# Patient Record
Sex: Female | Born: 1955
Health system: Southern US, Community
[De-identification: ages and names within clinical notes are randomized; demographics above are authoritative.]

## PROBLEM LIST (undated history)

## (undated) DIAGNOSIS — R112 Nausea with vomiting, unspecified: Secondary | ICD-10-CM

## (undated) DIAGNOSIS — M797 Fibromyalgia: Secondary | ICD-10-CM

## (undated) DIAGNOSIS — F32A Depression, unspecified: Secondary | ICD-10-CM

## (undated) DIAGNOSIS — Z9889 Other specified postprocedural states: Secondary | ICD-10-CM

## (undated) DIAGNOSIS — Z460 Encounter for fitting and adjustment of spectacles and contact lenses: Secondary | ICD-10-CM

## (undated) DIAGNOSIS — C439 Malignant melanoma of skin, unspecified: Secondary | ICD-10-CM

## (undated) DIAGNOSIS — C482 Malignant neoplasm of peritoneum, unspecified: Secondary | ICD-10-CM

## (undated) DIAGNOSIS — M1990 Unspecified osteoarthritis, unspecified site: Secondary | ICD-10-CM

## (undated) DIAGNOSIS — G8929 Other chronic pain: Secondary | ICD-10-CM

## (undated) DIAGNOSIS — F329 Major depressive disorder, single episode, unspecified: Secondary | ICD-10-CM

## (undated) HISTORY — PX: CATARACT EXTRACTION W/ INTRAOCULAR LENS  IMPLANT, BILATERAL: SHX1307

## (undated) HISTORY — PX: EPIDURAL BLOCK INJECTION: SHX1516

## (undated) HISTORY — PX: BACK SURGERY: SHX140

## (undated) HISTORY — PX: BREAST BIOPSY: SHX20

## (undated) HISTORY — PX: COLONOSCOPY: SHX174

## (undated) HISTORY — PX: SKIN CANCER EXCISION: SHX779

## (undated) HISTORY — DX: Malignant melanoma of skin, unspecified: C43.9

## (undated) HISTORY — PX: TONSILLECTOMY: SUR1361

---

## 1980-05-30 HISTORY — PX: BREAST ENHANCEMENT SURGERY: SHX7

## 1999-03-08 ENCOUNTER — Ambulatory Visit (HOSPITAL_COMMUNITY): Admission: RE | Admit: 1999-03-08 | Discharge: 1999-03-08 | Payer: Self-pay | Admitting: Obstetrics and Gynecology

## 1999-03-08 ENCOUNTER — Encounter: Payer: Self-pay | Admitting: Obstetrics and Gynecology

## 1999-05-31 HISTORY — PX: ELBOW SURGERY: SHX618

## 1999-08-18 ENCOUNTER — Ambulatory Visit (HOSPITAL_BASED_OUTPATIENT_CLINIC_OR_DEPARTMENT_OTHER): Admission: RE | Admit: 1999-08-18 | Discharge: 1999-08-18 | Payer: Self-pay | Admitting: Orthopedic Surgery

## 2000-05-30 HISTORY — PX: DIAGNOSTIC LAPAROSCOPY: SUR761

## 2000-07-07 ENCOUNTER — Encounter: Payer: Self-pay | Admitting: Emergency Medicine

## 2000-07-07 ENCOUNTER — Emergency Department (HOSPITAL_COMMUNITY): Admission: EM | Admit: 2000-07-07 | Discharge: 2000-07-07 | Payer: Self-pay | Admitting: Emergency Medicine

## 2001-02-21 ENCOUNTER — Emergency Department (HOSPITAL_COMMUNITY): Admission: EM | Admit: 2001-02-21 | Discharge: 2001-02-21 | Payer: Self-pay | Admitting: *Deleted

## 2001-05-30 HISTORY — PX: CARPAL TUNNEL RELEASE: SHX101

## 2001-05-30 HISTORY — PX: DILATION AND CURETTAGE OF UTERUS: SHX78

## 2001-06-08 ENCOUNTER — Ambulatory Visit (HOSPITAL_BASED_OUTPATIENT_CLINIC_OR_DEPARTMENT_OTHER): Admission: RE | Admit: 2001-06-08 | Discharge: 2001-06-08 | Payer: Self-pay | Admitting: Orthopedic Surgery

## 2001-12-03 ENCOUNTER — Encounter: Admission: RE | Admit: 2001-12-03 | Discharge: 2001-12-03 | Payer: Self-pay | Admitting: Orthopedic Surgery

## 2001-12-03 ENCOUNTER — Encounter: Payer: Self-pay | Admitting: Orthopedic Surgery

## 2001-12-20 ENCOUNTER — Encounter: Admission: RE | Admit: 2001-12-20 | Discharge: 2001-12-20 | Payer: Self-pay | Admitting: Orthopedic Surgery

## 2001-12-20 ENCOUNTER — Encounter: Payer: Self-pay | Admitting: Orthopedic Surgery

## 2002-02-04 ENCOUNTER — Other Ambulatory Visit: Admission: RE | Admit: 2002-02-04 | Discharge: 2002-02-04 | Payer: Self-pay | Admitting: Obstetrics and Gynecology

## 2002-03-21 ENCOUNTER — Encounter (INDEPENDENT_AMBULATORY_CARE_PROVIDER_SITE_OTHER): Payer: Self-pay

## 2002-03-21 ENCOUNTER — Ambulatory Visit (HOSPITAL_COMMUNITY): Admission: RE | Admit: 2002-03-21 | Discharge: 2002-03-21 | Payer: Self-pay | Admitting: Obstetrics and Gynecology

## 2003-12-30 ENCOUNTER — Other Ambulatory Visit: Admission: RE | Admit: 2003-12-30 | Discharge: 2003-12-30 | Payer: Self-pay | Admitting: Obstetrics and Gynecology

## 2004-04-20 ENCOUNTER — Ambulatory Visit: Payer: Self-pay | Admitting: Family Medicine

## 2004-07-22 ENCOUNTER — Ambulatory Visit: Payer: Self-pay | Admitting: Family Medicine

## 2004-09-20 ENCOUNTER — Ambulatory Visit: Payer: Self-pay | Admitting: Family Medicine

## 2004-09-23 ENCOUNTER — Ambulatory Visit: Payer: Self-pay | Admitting: Cardiology

## 2004-10-06 ENCOUNTER — Ambulatory Visit: Payer: Self-pay | Admitting: Family Medicine

## 2004-10-07 ENCOUNTER — Emergency Department (HOSPITAL_COMMUNITY): Admission: EM | Admit: 2004-10-07 | Discharge: 2004-10-07 | Payer: Self-pay | Admitting: Emergency Medicine

## 2004-10-07 ENCOUNTER — Ambulatory Visit: Payer: Self-pay | Admitting: Family Medicine

## 2005-02-03 ENCOUNTER — Ambulatory Visit: Payer: Self-pay | Admitting: Family Medicine

## 2005-03-10 ENCOUNTER — Other Ambulatory Visit: Admission: RE | Admit: 2005-03-10 | Discharge: 2005-03-10 | Payer: Self-pay | Admitting: Obstetrics and Gynecology

## 2005-07-20 ENCOUNTER — Ambulatory Visit: Payer: Self-pay | Admitting: Family Medicine

## 2006-03-15 ENCOUNTER — Ambulatory Visit: Payer: Self-pay | Admitting: Gastroenterology

## 2006-04-13 ENCOUNTER — Encounter (INDEPENDENT_AMBULATORY_CARE_PROVIDER_SITE_OTHER): Payer: Self-pay | Admitting: *Deleted

## 2006-04-14 ENCOUNTER — Ambulatory Visit (HOSPITAL_COMMUNITY): Admission: RE | Admit: 2006-04-14 | Discharge: 2006-04-14 | Payer: Self-pay | Admitting: Gastroenterology

## 2006-04-26 ENCOUNTER — Ambulatory Visit: Payer: Self-pay | Admitting: Gastroenterology

## 2007-08-28 ENCOUNTER — Ambulatory Visit: Payer: Self-pay | Admitting: Gastroenterology

## 2007-10-18 ENCOUNTER — Ambulatory Visit: Payer: Self-pay | Admitting: Vascular Surgery

## 2007-11-23 ENCOUNTER — Emergency Department (HOSPITAL_COMMUNITY): Admission: EM | Admit: 2007-11-23 | Discharge: 2007-11-23 | Payer: Self-pay | Admitting: Emergency Medicine

## 2008-06-19 ENCOUNTER — Encounter: Admission: RE | Admit: 2008-06-19 | Discharge: 2008-06-19 | Payer: Self-pay | Admitting: Obstetrics and Gynecology

## 2008-06-26 ENCOUNTER — Encounter: Admission: RE | Admit: 2008-06-26 | Discharge: 2008-06-26 | Payer: Self-pay | Admitting: Obstetrics and Gynecology

## 2009-05-30 HISTORY — PX: BREAST IMPLANT EXCHANGE: SHX6296

## 2010-06-20 ENCOUNTER — Encounter: Payer: Self-pay | Admitting: Obstetrics and Gynecology

## 2010-06-20 ENCOUNTER — Encounter: Payer: Self-pay | Admitting: Orthopedic Surgery

## 2010-08-24 ENCOUNTER — Other Ambulatory Visit: Payer: Self-pay | Admitting: Obstetrics and Gynecology

## 2010-10-12 NOTE — Assessment & Plan Note (Signed)
[facility]                         GASTROENTEROLOGY OFFICE NOTE   NAME:[name], [name] WILLARD                    MRN:          [id number]  DATE:08/28/2007                            DOB:          07/05/1955    This is a return office visit for worsening gas and bloating.  Her  symptoms are exacerbated by meals.  She has the frequent impression that  she is not fully evacuating with bowel movements.  She states she tends  to have a bowel movement every day.  She notes no change in stool  caliber, melena, hematochezia or weight loss.  Prior colonoscopy for  diarrhea, gas and bloating in November of 2007 showed sigmoid colon  diverticulosis.  Random colonic biopsies were negative.   CURRENT MEDICATIONS:  Listed on the chart, updated and reviewed.   MEDICATION ALLERGIES:  None known.   PHYSICAL EXAMINATION:  No acute distress.  Weight 149.2 pounds.  Blood pressure is 116/74, pulse 72 and regular.  HEENT EXAM:  Anicteric sclerae, oropharynx clear.  CHEST:  Clear to auscultation bilaterally.  CARDIAC:  Regular rate and rhythm without murmurs.  ABDOMEN:  Soft, nontender, nondistended, normoactive bowel sounds, no  palpable organomegaly, masses or hernias.   ASSESSMENT/PLAN:  Presumed irritable bowel syndrome with gas, bloating and incomplete  fecal evacuation. Diverticulosis. She is advised to increase her daily  fiber and fluid intake.  Begin MiraLax b.i.d. p.r.n., Colace q. day  p.r.n.  Begin a low-gas diet.  Consider a trial of Xifaxan if the above  measures are not adequate.  Return office visit in 4 to 6 weeks.     Venita Lick. Russella Dar, MD, [facility]  Electronically Signed    MTS/MedQ  DD: 08/30/2007  DT: 08/30/2007  Job #: [id number]   cc:   Dina Rich

## 2010-10-15 NOTE — Op Note (Signed)
[place]. [facility]  Patient:    [name] [name], [name] [name]                     MRN: [id number] Proc. Date: 08/18/99 Adm. Date:  [id number] Attending:  Milly Jakob CC:         Harvie Junior, M.D.                           Operative Report  PREOPERATIVE DIAGNOSES: 1. Lateral epicondylitis. 2. Radial nerve compression documented by electromyography studies, essentially    posterior interosseous nerve compression.  POSTOPERATIVE DIAGNOSES: 1. Lateral epicondylitis. 2. Radial nerve compression documented by electromyography studies, essentially    posterior interosseous nerve compression.  PROCEDURES: 1. Lateral epicondylar release by way of inertial procedure. 2. Decompression of the posterior interosseous nerve.  SURGEON:  Harvie Junior, M.D.  ASSISTANT:  Kerby Less, P.A.  ANESTHESIA:  Axillary block.  BRIEF HISTORY:  She is a 55 year old female with a long history of having had right elbow pain.  She had been treated with injection therapy and this had helped, but there was recurrence of her symptomatology.  She did have an injection in the area of the posterior interosseous nerve and this helped also with her pain, but this recurred.  She did have EMG studies preoperatively, and this was noted to document posterior interosseous nerve compression.  We had a long talk about treatment options at that point but ultimately felt that lateral epicondylar release and radial nerve decompression was appropriate, and she was brought down for these procedures.  DESCRIPTION OF PROCEDURE:  The patient was brought to the operating room and after adequate anesthesia was obtained with an axillary block, the patient was placed on the operating table.  The right arm was then prepped and draped in the usual sterile fashion.  Following this, a linear incision was made about a centimeter  anterior to the lateral epicondyle and going distally about 7 cm,  through the subcutaneous tissue and down to the level of the fascia, and flaps were raised.  The area over the lateral epicondyle was then identified and sharply opened and the periosteum was resected from the bone both proximally and distally in this area. The extensor brevis origin was then identified underneath and the scar was removed out of there.  Fairly dramatic and dense scar tissue was in the area of the brevis. This was resected down to the level of the radiocapitellar joint, which was opened for inspection and noted to have no significant intra-articular abnormalities.  Following a careful debridement in this area, a rongeur was used to freshen the  bone over the lateral epicondyle and the fascia was then closed with an 0 Vicryl interrupted suture.  Following this, attention was turned to the interval between the extensor brevis and the extensor communis.  This was exploited and the supinator muscle was identified below.  The radial nerve was identified in this  area.  It was noted to have fairly dramatic compression by _____ vessels just right over the arcade of Frohse.  The _____ vessels was identified and divided nd cauterized, and then the arcade of Frohse was identified and the fascial band was resected at the origin of the supinator muscle.  The supinator muscle was then divided distally to the distal extent of the supinator muscle, and there was noted to be no more fascial compression of the nerve distally.  A check was made proximally.  There did not appear to be any significant compression on the nerve proximally.  At this point the wound was copiously irrigated and suctioned dry.  The skin was then closed with a combination of 2-0 Vicryl and a running Prolene  suture.  Benzoin and Steri-Strips were applied.  A sterile _____ dressing was then applied with the patient in a 90 degree plaster at the elbow.  She was then taken to the recovery room, where she  was noted to be in satisfactory condition. Estimated blood loss for the procedure was none. DD:  08/18/99 TD:  08/19/99 Job: [id number]

## 2010-10-15 NOTE — Assessment & Plan Note (Signed)
[facility]                           GASTROENTEROLOGY OFFICE NOTE   NAME:[name] [name], [name] [name]                    MRN:          [id number]  DATE:03/15/2006                            DOB:          27-Jul-1955    REASON FOR REFERRAL:  Dyspepsia, gas, bloating and diarrhea.   HISTORY OF PRESENT ILLNESS:  [name] [name] is a 55 year old white female,  who relates a long history of postprandial belching and frequent problems  with postprandial gas, bloating and diarrhea.  She occasionally has had mild  constipation.  She had taken Nexium in the past with some improvement in  symptoms.  She states she had a barium enema about 5 to 7 years ago that was  unremarkable.  She relates no dysphagia, odynophagia, melena, hematochezia  or change in stool caliber.  She has noted about a 20-pound weight gain  since July.  No family members with colon cancer, colon polyps or  inflammatory bowel disease.   PAST MEDICAL HISTORY:  1. Fibromyalgia.  2. Arthritis.  3. Depression.  4. Status post diagnostic laparoscopy in 2002.  5. Status post bilateral breast augmentation.   CURRENT MEDICATIONS:  Listed on the chart, updated and reviewed.   MEDICATION ALLERGIES:  None known.   Social history and review of systems per the handwritten form.   PHYSICAL EXAMINATION:  Well-developed, overweight white female.  Height 5  feet 2 inches.  Blood pressure is 110/70, pulse 72 and regular.  HEENT:  Anicteric sclerae.  Oropharynx is clear.  CHEST:  Clear to auscultation bilaterally.  CARDIAC:  Regular rate and rhythm without murmurs.  ABDOMEN:  Soft, nontender, nondistended.  Normal active bowel sounds, no  palpable organomegaly, masses or hernias.  RECTAL:  Examination deferred to time of colonoscopy.  NEUROLOGIC:  Alert and oriented x3.  Grossly nonfocal.   ASSESSMENT AND PLAN:  I suspect this is irritable bowel syndrome and  gastroesophageal reflux disease.  Trial of  standard antireflux measures and  standard dietary recommendations for intestinal gas and bloating.  Begin  Prevacid 30 mg by mouth every morning.  Need to exclude colorectal  neoplasms, inflammatory bowel disease, ulcer disease, Barrett's esophagus  and erosive esophagitis.  Risks, benefits and alternatives to upper  endoscopy with possible biopsy,  and colonoscopy with possible biopsy and possible polypectomy discussed with  the patient, and she consents to proceed.  Both procedures will be scheduled  electively.     Venita Lick. Russella Dar, MD, [facility]  Electronically Signed    MTS/MedQ  DD: 03/28/2006  DT: 03/29/2006  Job #: [id number]   cc:   Dina Rich

## 2010-10-15 NOTE — Op Note (Signed)
[place]. [facility]  Patient:    [name] [name], [name] [name] Visit Number: [id number] MRN: [id number]          Service Type: DSU Location: [facility] Attending Physician:  Milly Jakob Dictated by:   Harvie Junior, M.D. Proc. Date: 06/08/01 Admit Date:  06/08/2001                             Operative Report  PREOPERATIVE DIAGNOSIS:  Carpal tunnel syndrome, right.  POSTOPERATIVE DIAGNOSIS:  Carpal tunnel syndrome, right.  PROCEDURE:  Right carpal tunnel release.  SURGEON:  Harvie Junior, M.D.  ASSISTANT:  Currie Paris. Thedore Mins.  ANESTHESIA:  Forearm-based IV regional.  BRIEF HISTORY:  This is a 55 year old female with a long history of having significant neck problems as well as bilateral carpal tunnel syndrome.  She ultimately had been treated for her neck problems and carpal tunnel and had continued persistent symptoms.  Because of her persistent symptoms, the patient was ultimately taken to the operating room for carpal tunnel release.  DESCRIPTION OF PROCEDURE:  The patient was brought to the operating room and after adequate anesthesia was obtained with a forearm-based IV regional, the patient was placed supine upon the operating table.  The right arm was prepped and draped in the usual sterile fashion.  Following this, a linear incision was made in the ulnarmost of the patients two midline wrist creases.  The subcutaneous tissues were taken down to the level of the volar carpal ligament, which was sharply divided.  The ligament was then released proximally and distally.  A gloved finger could be placed in the wound in both directions.  The nerve was identified and noted to be without significant evidence of adhesion or abnormality intrinsic.  The motor branch of the median nerve was identified.  At this point the hand was copiously irrigated and suctioned dry.  The skin was then closed with a combination of interrupted and running sutures.  A sterile  and compressive dressing was then applied and the patient was taken to the recovery room, where she was noted to be in satisfactory condition.  Estimated blood loss for the procedure was none. Dictated by:   Harvie Junior, M.D. Attending Physician:  Milly Jakob DD:  06/08/01 TD:  06/08/01 Job: [id number]

## 2010-10-15 NOTE — Op Note (Signed)
   NAME:  SYLVER, VANTASSELL NO.:  [id number]   MEDICAL RECORD NO.:  [id number]                   PATIENT TYPE:  AMB   LOCATION:  SDC                                  FACILITY:  [facility]   PHYSICIAN:  Malva Limes, MD                   DATE OF BIRTH:  1956/01/08   DATE OF PROCEDURE:  03/21/2002  DATE OF DISCHARGE:                                 OPERATIVE REPORT   PREOPERATIVE DIAGNOSES:  Menometrorrhagia.   POSTOPERATIVE DIAGNOSES:  1. Menometrorrhagia.  2. Large endometrial polyp.   PROCEDURE:  1. Dilatation and curettage.  2. Cryo ablation of the endometrium.   SURGEON:  Malva Limes, MD   ANESTHESIA:  MAC with paracervical block.   ESTIMATED BLOOD LOSS:  20 cc.   DRAINS:  None.   COMPLICATIONS:  None.   SPECIMENS:  Endometrial polyp and endometrial curettings sent to pathology.   ANTIBIOTICS:  Ancef 1 g.   PROCEDURE:  The patient was taken to the operating room where she was placed  in a dorsal lithotomy position and MAC anesthesia was administered.  She was  then prepped with Hibiclens and draped in the usual fashion for this  procedure.  A sterile speculum was placed in the vagina.  Single tooth  tenaculum applied to the anterior cervical lip.  20 cc of 1% lidocaine was  used for a paracervical block.  Cervical os was then serially dilated to a  29 [place].  The uterine cavity was sounded to 10 cm.  Sharp curettage was  then advanced into the uterine cavity and sharp curettage was performed.  A  large endometrial polyp was delivered along with copious amount of tissue.  All was sent to pathology.  Following this the cryo ablation machine was set  up.  Warm up was completed without any complications.  The probe was then  placed into the right cornua and the uterine cavity frozen for seven  minutes.  Next, the opposite side was frozen for seven minutes.  The lower  uterine segment was then frozen for five minutes.  The patient tolerated  procedure well.  She was taken to recovery room in stable condition.  She  will be discharged to home.  She will be instructed to follow up in the  office in four weeks.                                               Malva Limes, MD   MA/MEDQ  D:  03/21/2002  T:  03/21/2002  Job:  [id number]

## 2011-02-24 LAB — URINALYSIS, ROUTINE W REFLEX MICROSCOPIC
Bilirubin Urine: NEGATIVE
Glucose, UA: NEGATIVE
Hgb urine dipstick: NEGATIVE
Ketones, ur: NEGATIVE
Protein, ur: NEGATIVE

## 2011-02-24 LAB — DIFFERENTIAL
Basophils Absolute: 0
Eosinophils Relative: 0
Lymphocytes Relative: 7 — ABNORMAL LOW
Lymphs Abs: 0.9
Monocytes Absolute: 0.6

## 2011-02-24 LAB — POCT I-STAT, CHEM 8
BUN: 11
Calcium, Ion: 1.12
Creatinine, Ser: 1.1
TCO2: 25

## 2011-02-24 LAB — CBC
HCT: 40.6
Hemoglobin: 13.9
RDW: 12.9

## 2012-01-27 ENCOUNTER — Other Ambulatory Visit: Payer: Self-pay | Admitting: Obstetrics and Gynecology

## 2012-01-27 DIAGNOSIS — R928 Other abnormal and inconclusive findings on diagnostic imaging of breast: Secondary | ICD-10-CM

## 2012-01-31 ENCOUNTER — Ambulatory Visit: Payer: Self-pay

## 2012-02-06 ENCOUNTER — Ambulatory Visit
Admission: RE | Admit: 2012-02-06 | Discharge: 2012-02-06 | Disposition: A | Discharge status: Home / Self Care | Payer: BC Managed Care – PPO | Source: Ambulatory Visit | Attending: Obstetrics and Gynecology | Admitting: Obstetrics and Gynecology

## 2012-02-06 ENCOUNTER — Other Ambulatory Visit: Payer: Self-pay | Admitting: Diagnostic Radiology

## 2012-02-06 ENCOUNTER — Other Ambulatory Visit: Payer: Self-pay | Admitting: Obstetrics and Gynecology

## 2012-02-06 DIAGNOSIS — R928 Other abnormal and inconclusive findings on diagnostic imaging of breast: Secondary | ICD-10-CM

## 2012-06-19 ENCOUNTER — Encounter (HOSPITAL_BASED_OUTPATIENT_CLINIC_OR_DEPARTMENT_OTHER): Payer: Self-pay | Admitting: *Deleted

## 2012-06-19 ENCOUNTER — Other Ambulatory Visit: Payer: Self-pay | Admitting: Orthopedic Surgery

## 2012-06-19 NOTE — Progress Notes (Signed)
No labs needed Pt does snore and has talked with PCP about study

## 2012-06-22 ENCOUNTER — Encounter (HOSPITAL_BASED_OUTPATIENT_CLINIC_OR_DEPARTMENT_OTHER)
Admission: RE | Disposition: A | Discharge status: Home / Self Care | Payer: Self-pay | Source: Ambulatory Visit | Attending: Orthopedic Surgery

## 2012-06-22 ENCOUNTER — Encounter (HOSPITAL_BASED_OUTPATIENT_CLINIC_OR_DEPARTMENT_OTHER): Payer: Self-pay | Admitting: Anesthesiology

## 2012-06-22 ENCOUNTER — Ambulatory Visit (HOSPITAL_BASED_OUTPATIENT_CLINIC_OR_DEPARTMENT_OTHER): Payer: BC Managed Care – PPO | Admitting: Anesthesiology

## 2012-06-22 ENCOUNTER — Encounter (HOSPITAL_BASED_OUTPATIENT_CLINIC_OR_DEPARTMENT_OTHER): Payer: Self-pay | Admitting: *Deleted

## 2012-06-22 ENCOUNTER — Ambulatory Visit (HOSPITAL_BASED_OUTPATIENT_CLINIC_OR_DEPARTMENT_OTHER)
Admission: RE | Admit: 2012-06-22 | Discharge: 2012-06-22 | Disposition: A | Discharge status: Home / Self Care | Payer: BC Managed Care – PPO | Source: Ambulatory Visit | Attending: Orthopedic Surgery | Admitting: Orthopedic Surgery

## 2012-06-22 DIAGNOSIS — G8929 Other chronic pain: Secondary | ICD-10-CM | POA: Insufficient documentation

## 2012-06-22 DIAGNOSIS — M19049 Primary osteoarthritis, unspecified hand: Secondary | ICD-10-CM | POA: Insufficient documentation

## 2012-06-22 HISTORY — DX: Major depressive disorder, single episode, unspecified: F32.9

## 2012-06-22 HISTORY — DX: Depression, unspecified: F32.A

## 2012-06-22 HISTORY — PX: CARPOMETACARPEL SUSPENSION PLASTY: SHX5005

## 2012-06-22 HISTORY — DX: Unspecified osteoarthritis, unspecified site: M19.90

## 2012-06-22 HISTORY — DX: Nausea with vomiting, unspecified: R11.2

## 2012-06-22 HISTORY — DX: Encounter for fitting and adjustment of spectacles and contact lenses: Z46.0

## 2012-06-22 HISTORY — DX: Fibromyalgia: M79.7

## 2012-06-22 HISTORY — DX: Other specified postprocedural states: Z98.890

## 2012-06-22 HISTORY — DX: Other chronic pain: G89.29

## 2012-06-22 LAB — POCT HEMOGLOBIN-HEMACUE: Hemoglobin: 11.6 g/dL — ABNORMAL LOW (ref 12.0–15.0)

## 2012-06-22 SURGERY — CARPOMETACARPEL (CMC) SUSPENSION PLASTY
Anesthesia: Regional | Site: Thumb | Laterality: Left | Wound class: Clean

## 2012-06-22 MED ORDER — LACTATED RINGERS IV SOLN
INTRAVENOUS | Status: DC
  Administered 2012-06-22 (×2): via INTRAVENOUS

## 2012-06-22 MED ORDER — FENTANYL CITRATE 0.05 MG/ML IJ SOLN: 25.0000 ug | INTRAMUSCULAR | Status: DC | PRN

## 2012-06-22 MED ORDER — OXYCODONE-ACETAMINOPHEN 5-325 MG PO TABS: 1.0000 | ORAL_TABLET | ORAL | Status: DC | PRN

## 2012-06-22 MED ORDER — CEFAZOLIN SODIUM-DEXTROSE 2-3 GM-% IV SOLR
2.0000 g | INTRAVENOUS | Status: AC
Start: 2012-06-23 — End: 2012-06-22
  Administered 2012-06-22: 2 g via INTRAVENOUS

## 2012-06-22 MED ORDER — POVIDONE-IODINE 7.5 % EX SOLN: Freq: Once | CUTANEOUS | Status: DC

## 2012-06-22 MED ORDER — BUPIVACAINE-EPINEPHRINE PF 0.5-1:200000 % IJ SOLN
INTRAMUSCULAR | Status: DC | PRN
  Administered 2012-06-22: 25 mL

## 2012-06-22 MED ORDER — OXYCODONE HCL 5 MG/5ML PO SOLN: 5.0000 mg | Freq: Once | ORAL | Status: DC | PRN

## 2012-06-22 MED ORDER — DEXAMETHASONE SODIUM PHOSPHATE 4 MG/ML IJ SOLN
INTRAMUSCULAR | Status: DC | PRN
  Administered 2012-06-22: 10 mg via INTRAVENOUS

## 2012-06-22 MED ORDER — DEXAMETHASONE SODIUM PHOSPHATE 10 MG/ML IJ SOLN
INTRAMUSCULAR | Status: DC | PRN
  Administered 2012-06-22: 7 mg

## 2012-06-22 MED ORDER — MIDAZOLAM HCL 2 MG/2ML IJ SOLN
1.0000 mg | INTRAMUSCULAR | Status: DC | PRN
  Administered 2012-06-22: 2 mg via INTRAVENOUS

## 2012-06-22 MED ORDER — PROPOFOL 10 MG/ML IV BOLUS
INTRAVENOUS | Status: DC | PRN
  Administered 2012-06-22: 250 mg via INTRAVENOUS

## 2012-06-22 MED ORDER — FENTANYL CITRATE 0.05 MG/ML IJ SOLN
50.0000 ug | INTRAMUSCULAR | Status: DC | PRN
  Administered 2012-06-22 (×2): 100 ug via INTRAVENOUS

## 2012-06-22 MED ORDER — LIDOCAINE HCL (CARDIAC) 20 MG/ML IV SOLN
INTRAVENOUS | Status: DC | PRN
  Administered 2012-06-22: 80 mg via INTRAVENOUS

## 2012-06-22 MED ORDER — ONDANSETRON HCL 4 MG/2ML IJ SOLN
INTRAMUSCULAR | Status: DC | PRN
  Administered 2012-06-22: 4 mg via INTRAVENOUS

## 2012-06-22 MED ORDER — OXYCODONE HCL 5 MG PO TABS: 5.0000 mg | ORAL_TABLET | Freq: Once | ORAL | Status: DC | PRN

## 2012-06-22 MED ORDER — PROMETHAZINE HCL 25 MG PO TABS: 25.0000 mg | ORAL_TABLET | Freq: Four times a day (QID) | ORAL | Status: DC | PRN

## 2012-06-22 MED ORDER — ONDANSETRON HCL 4 MG/2ML IJ SOLN: 4.0000 mg | Freq: Four times a day (QID) | INTRAMUSCULAR | Status: DC | PRN

## 2012-06-22 SURGICAL SUPPLY — 81 items
APL SKNCLS STERI-STRIP NONHPOA (GAUZE/BANDAGES/DRESSINGS)
BANDAGE ELASTIC 3 VELCRO ST LF (GAUZE/BANDAGES/DRESSINGS) ×2 IMPLANT
BENZOIN TINCTURE PRP APPL 2/3 (GAUZE/BANDAGES/DRESSINGS) IMPLANT
BIT DRILL 11/64XX180123XX4 (BIT) ×1
BIT DRILL 11/64XX180123XX4.3 (BIT) ×1 IMPLANT
BLADE MINI RND TIP GREEN BEAV (BLADE) ×2 IMPLANT
BLADE SURG 15 STRL LF DISP TIS (BLADE) ×1 IMPLANT
BLADE SURG 15 STRL SS (BLADE) ×2
BNDG CMPR 9X4 STRL LF SNTH (GAUZE/BANDAGES/DRESSINGS) ×1
BNDG ESMARK 4X9 LF (GAUZE/BANDAGES/DRESSINGS) ×2 IMPLANT
CANISTER SUCTION 1200CC (MISCELLANEOUS) IMPLANT
CLOTH BEACON ORANGE TIMEOUT ST (SAFETY) ×2 IMPLANT
COVER MAYO STAND STRL (DRAPES) ×2 IMPLANT
COVER TABLE BACK 60X90 (DRAPES) ×2 IMPLANT
CUFF TOURNIQUET SINGLE 18IN (TOURNIQUET CUFF) IMPLANT
DECANTER SPIKE VIAL GLASS SM (MISCELLANEOUS) IMPLANT
DRAIN PENROSE 1/4X12 LTX STRL (WOUND CARE) IMPLANT
DRAPE EXTREMITY T 121X128X90 (DRAPE) ×2 IMPLANT
DRAPE OEC MINIVIEW 54X84 (DRAPES) ×2 IMPLANT
DRAPE SURG 17X23 STRL (DRAPES) ×2 IMPLANT
DRAPE U-SHAPE 47X51 STRL (DRAPES) IMPLANT
DRILL BIT 1/8DIAX5INL DISPOSE (BIT) ×2 IMPLANT
DRILL BIT 11/64XX180123XX4.3 (BIT) ×2
DURAPREP 26ML APPLICATOR (WOUND CARE) ×2 IMPLANT
ELECT NEEDLE TIP 2.8 STRL (NEEDLE) IMPLANT
ELECT REM PT RETURN 9FT ADLT (ELECTROSURGICAL) ×2
ELECTRODE REM PT RTRN 9FT ADLT (ELECTROSURGICAL) ×1 IMPLANT
GAUZE XEROFORM 1X8 LF (GAUZE/BANDAGES/DRESSINGS) ×2 IMPLANT
GLOVE BIOGEL M STRL SZ7.5 (GLOVE) ×2 IMPLANT
GLOVE BIOGEL PI IND STRL 7.0 (GLOVE) ×1 IMPLANT
GLOVE BIOGEL PI IND STRL 8 (GLOVE) ×3 IMPLANT
GLOVE BIOGEL PI INDICATOR 7.0 (GLOVE) ×1
GLOVE BIOGEL PI INDICATOR 8 (GLOVE) ×3
GLOVE ECLIPSE 7.5 STRL STRAW (GLOVE) ×4 IMPLANT
GLOVE EXAM NITRILE EXT CUFF MD (GLOVE) ×2 IMPLANT
GLOVE SKINSENSE NS SZ6.5 (GLOVE) ×1
GLOVE SKINSENSE NS SZ7.5 (GLOVE) ×3
GLOVE SKINSENSE STRL SZ6.5 (GLOVE) ×1 IMPLANT
GLOVE SKINSENSE STRL SZ7.5 (GLOVE) ×3 IMPLANT
GOWN BRE IMP PREV XXLGXLNG (GOWN DISPOSABLE) ×2 IMPLANT
GOWN PREVENTION PLUS XLARGE (GOWN DISPOSABLE) ×2 IMPLANT
GOWN PREVENTION PLUS XXLARGE (GOWN DISPOSABLE) ×4 IMPLANT
KIT MINI BIO ANCHOR DRILL (KITS) IMPLANT
NEEDLE FISTULA 1/2 CIRCLE (NEEDLE) IMPLANT
NEEDLE HYPO 25X1 1.5 SAFETY (NEEDLE) IMPLANT
NEEDLE KEITH (NEEDLE) ×2 IMPLANT
NS IRRIG 1000ML POUR BTL (IV SOLUTION) IMPLANT
PACK BASIN DAY SURGERY FS (CUSTOM PROCEDURE TRAY) ×2 IMPLANT
PAD CAST 3X4 CTTN HI CHSV (CAST SUPPLIES) ×2 IMPLANT
PADDING CAST ABS 4INX4YD NS (CAST SUPPLIES) ×1
PADDING CAST ABS COTTON 4X4 ST (CAST SUPPLIES) ×1 IMPLANT
PADDING CAST COTTON 3X4 STRL (CAST SUPPLIES) ×4
PASSER SUT SWANSON 36MM LOOP (INSTRUMENTS) IMPLANT
PENCIL BUTTON HOLSTER BLD 10FT (ELECTRODE) ×2 IMPLANT
SHEET MEDIUM DRAPE 40X70 STRL (DRAPES) IMPLANT
SPLINT PLASTER CAST XFAST 3X15 (CAST SUPPLIES) ×10 IMPLANT
SPLINT PLASTER XTRA FASTSET 3X (CAST SUPPLIES) ×10
SPONGE GAUZE 4X4 12PLY (GAUZE/BANDAGES/DRESSINGS) ×2 IMPLANT
STOCKINETTE 4X48 STRL (DRAPES) IMPLANT
STRIP CLOSURE SKIN 1/2X4 (GAUZE/BANDAGES/DRESSINGS) IMPLANT
SUCTION FRAZIER TIP 10 FR DISP (SUCTIONS) IMPLANT
SUT ETHIBOND 3-0 V-5 (SUTURE) IMPLANT
SUT ETHILON 4 0 PS 2 18 (SUTURE) IMPLANT
SUT FIBERWIRE 4-0 18 DIAM BLUE (SUTURE)
SUT FIBERWIRE 4-0 18 TAPR NDL (SUTURE) ×6
SUT POLY BUTTON 15MM (SUTURE) IMPLANT
SUT PROLENE 3 0 PS 2 (SUTURE) IMPLANT
SUT VIC AB 2-0 SH 27 (SUTURE)
SUT VIC AB 2-0 SH 27XBRD (SUTURE) IMPLANT
SUT VIC AB 4-0 P-3 18XBRD (SUTURE) ×1 IMPLANT
SUT VIC AB 4-0 P3 18 (SUTURE) ×2
SUT VICRYL 4-0 PS2 18IN ABS (SUTURE) IMPLANT
SUTURE FIBERWR 4-0 18 DIA BLUE (SUTURE) IMPLANT
SUTURE FIBERWR 4-0 18 TAPR NDL (SUTURE) ×3 IMPLANT
SYR BULB 3OZ (MISCELLANEOUS) ×2 IMPLANT
SYR CONTROL 10ML LL (SYRINGE) IMPLANT
TOWEL OR 17X24 6PK STRL BLUE (TOWEL DISPOSABLE) ×2 IMPLANT
TOWEL OR NON WOVEN STRL DISP B (DISPOSABLE) ×2 IMPLANT
TUBE CONNECTING 20X1/4 (TUBING) IMPLANT
UNDERPAD 30X30 INCONTINENT (UNDERPADS AND DIAPERS) ×2 IMPLANT
WATER STERILE IRR 1000ML POUR (IV SOLUTION) ×2 IMPLANT

## 2012-06-22 NOTE — Brief Op Note (Signed)
06/22/2012  4:01 PM  PATIENT:  Lamar Benes  57 y.o. female  PRE-OPERATIVE DIAGNOSIS:  cmc joint arthritis left thumb  POST-OPERATIVE DIAGNOSIS:  Carpometacarpal joint arthritis Left Thumb  PROCEDURE:  Procedure(s) (LRB) with comments: CARPOMETACARPEL (CMC) SUSPENSION PLASTY (Left) - burton's interposition arthroplasty left thumb   SURGEON:  Surgeon(s) and Role:    * Harvie Junior, MD - Primary  PHYSICIAN ASSISTANT:   ASSISTANTS: bethune   ANESTHESIA:   general  EBL:  Total I/O In: 1400 [I.V.:1400] Out: -   BLOOD ADMINISTERED:none  DRAINS: none   LOCAL MEDICATIONS USED:  MARCAINE     SPECIMEN:  No Specimen  DISPOSITION OF SPECIMEN:  N/A  COUNTS:  YES  TOURNIQUET:   Total Tourniquet Time Documented: Upper Arm (Left) - 84 minutes  DICTATION: .Other Dictation: Dictation Number [id number]  PLAN OF CARE: Discharge to home after PACU  PATIENT DISPOSITION:  PACU - hemodynamically stable.   Delay start of Pharmacological VTE agent (>24hrs) due to surgical blood loss or risk of bleeding: no

## 2012-06-22 NOTE — Progress Notes (Signed)
Assisted Dr. Crews with left, ultrasound guided, infraclavicular block. Side rails up, monitors on throughout procedure. See vital signs in flow sheet. Tolerated Procedure well. 

## 2012-06-22 NOTE — Anesthesia Postprocedure Evaluation (Signed)
  Anesthesia Post-op Note  Patient: [name] [name]  Procedure(s) Performed: Procedure(s) (LRB) with comments: CARPOMETACARPEL (CMC) SUSPENSION PLASTY (Left) - burton's interposition arthroplasty left thumb   Patient Location: PACU  Anesthesia Type:GA combined with regional for post-op pain  Level of Consciousness: awake and alert   Airway and Oxygen Therapy: Patient Spontanous Breathing and Patient connected to face mask oxygen  Post-op Pain: mild  Post-op Assessment: Post-op Vital signs reviewed  Post-op Vital Signs: Reviewed  Complications: No apparent anesthesia complications

## 2012-06-22 NOTE — Anesthesia Procedure Notes (Addendum)
Procedure Name: LMA Insertion Date/Time: 06/22/2012 2:12 PM Performed by: Gar Gibbon Pre-anesthesia Checklist: Patient identified, Emergency Drugs available, Suction available and Patient being monitored Patient Re-evaluated:Patient Re-evaluated prior to inductionOxygen Delivery Method: Circle System Utilized Preoxygenation: Pre-oxygenation with 100% oxygen Intubation Type: IV induction Ventilation: Mask ventilation without difficulty LMA: LMA inserted LMA Size: 4.0 Number of attempts: 1 Airway Equipment and Method: bite block Placement Confirmation: positive ETCO2 Tube secured with: Tape Dental Injury: Teeth and Oropharynx as per pre-operative assessment     Anesthesia Regional Block:  Supraclavicular block  Pre-Anesthetic Checklist: ,, timeout performed, Correct Patient, Correct Site, Correct Laterality, Correct Procedure, Correct Position, site marked, Risks and benefits discussed,  Surgical consent,  Pre-op evaluation,  At surgeon's request and post-op pain management  Laterality: Left and Upper  Prep: chloraprep       Needles:  Injection technique: Single-shot  Needle Type: Echogenic Needle     Needle Length: 5cm 5 cm Needle Gauge: 21 and 21 G    Additional Needles:  Procedures: ultrasound guided (picture in chart) Supraclavicular block Narrative:  Start time: 06/22/2012 1:40 PM End time: 06/22/2012 1:48 PM Injection made incrementally with aspirations every 5 mL.  Performed by: Personally  Anesthesiologist: Sheldon Silvan  Supraclavicular block

## 2012-06-22 NOTE — Transfer of Care (Signed)
Immediate Anesthesia Transfer of Care Note  Patient: [name]  Procedure(s) Performed: Procedure(s) (LRB) with comments: CARPOMETACARPEL (CMC) SUSPENSION PLASTY (Left) - burton's interposition arthroplasty left thumb   Patient Location: PACU  Anesthesia Type:GA combined with regional for post-op pain  Level of Consciousness: sedated  Airway & Oxygen Therapy: Patient Spontanous Breathing and Patient connected to face mask oxygen  Post-op Assessment: Report given to PACU RN and Post -op Vital signs reviewed and stable  Post vital signs: Reviewed and stable  Complications: No apparent anesthesia complications

## 2012-06-22 NOTE — H&P (Signed)
  PREOPERATIVE H&P  Chief Complaint: l thumb base pain  HPI: [name] [name] is a 57 y.o. female who presents for evaluation of l thumb base pain. It has been present for greater than 1 year and has been worsening. She has failed conservative measures. Pain is rated as severe.  Past Medical History  Diagnosis Date  . Fibromyalgia   . Arthritis   . Depression   . Chronic pain   . Contact lens/glasses fitting     wears contacts or glasses  . PONV (postoperative nausea and vomiting)    Past Surgical History  Procedure Date  . Tonsillectomy   . Diagnostic laparoscopy 2002    expl lap  . Dilation and curettage of uterus 2003    ablation  . Breast enhancement surgery 1982    implants   . Breast implant exchange 2011  . Carpal tunnel release 2003    rt and lt  . Elbow surgery 2001    decompression rt  . Back surgery     lumb lam  . Epidural block injection     multiple   History   Social History  . Marital Status: Married    Spouse Name: N/A    Number of Children: N/A  . Years of Education: N/A   Social History Main Topics  . Smoking status: Never Smoker   . Smokeless tobacco: None  . Alcohol Use: Yes     Comment: rare  . Drug Use: No  . Sexually Active:    Other Topics Concern  . None   Social History Narrative  . None   History reviewed. No pertinent family history. Allergies  Allergen Reactions  . Codeine Nausea And Vomiting  . Latex Rash   Prior to Admission medications   Medication Sig Start Date End Date Taking? Authorizing Provider  lisdexamfetamine (VYVANSE) 30 MG capsule Take 30 mg by mouth every morning.   Yes Historical Provider, MD  PARoxetine (PAXIL) 40 MG tablet Take 40 mg by mouth at bedtime.   Yes Historical Provider, MD  traZODone (DESYREL) 150 MG tablet Take 150 mg by mouth at bedtime.   Yes Historical Provider, MD     Positive ROS: none  All other systems have been reviewed and were otherwise negative with the exception of those  mentioned in the HPI and as above.  Physical Exam: There were no vitals filed for this visit.  General: Alert, no acute distress Cardiovascular: No pedal edema Respiratory: No cyanosis, no use of accessory musculature GI: No organomegaly, abdomen is soft and non-tender Skin: No lesions in the area of chief complaint Neurologic: Sensation intact distally Psychiatric: Patient is competent for consent with normal mood and affect Lymphatic: No axillary or cervical lymphadenopathy  MUSCULOSKELETAL: l thumb +ttp over cmc joint  XRAY: severe jt space loss with mild subluxation base of l thumb  Assessment/Plan: cmc joint arthritis left thumb Plan for Procedure(s): CARPOMETACARPEL (CMC) SUSPENSION PLASTY  The risks benefits and alternatives were discussed with the patient including but not limited to the risks of nonoperative treatment, versus surgical intervention including infection, bleeding, nerve injury, malunion, nonunion, hardware prominence, hardware failure, need for hardware removal, blood clots, cardiopulmonary complications, morbidity, mortality, among others, and they were willing to proceed.  Predicted outcome is good, although there will be at least a six to nine month expected recovery.  Kenzey Birkland L, MD 06/22/2012 1:12 PM

## 2012-06-22 NOTE — Anesthesia Preprocedure Evaluation (Addendum)
Anesthesia Evaluation  Patient identified by MRN, date of birth, ID band Patient awake    Reviewed: Allergy & Precautions, H&P , NPO status , Patient's Chart, lab work & pertinent test results  History of Anesthesia Complications (+) PONV  Airway Mallampati: II  Neck ROM: full    Dental   Pulmonary          Cardiovascular     Neuro/Psych Depression  Neuromuscular disease    GI/Hepatic   Endo/Other    Renal/GU      Musculoskeletal  (+) Arthritis -, Fibromyalgia -  Abdominal   Peds  Hematology   Anesthesia Other Findings   Reproductive/Obstetrics                           Anesthesia Physical Anesthesia Plan  ASA: II  Anesthesia Plan: General   Post-op Pain Management:    Induction: Intravenous  Airway Management Planned: LMA  Additional Equipment:   Intra-op Plan:   Post-operative Plan:   Informed Consent: I have reviewed the patients History and Physical, chart, labs and discussed the procedure including the risks, benefits and alternatives for the proposed anesthesia with the patient or authorized representative who has indicated his/her understanding and acceptance.     Plan Discussed with: CRNA, Surgeon and Anesthesiologist  Anesthesia Plan Comments:        Anesthesia Quick Evaluation

## 2012-06-25 ENCOUNTER — Encounter (HOSPITAL_BASED_OUTPATIENT_CLINIC_OR_DEPARTMENT_OTHER): Payer: Self-pay | Admitting: Orthopedic Surgery

## 2012-06-25 NOTE — Op Note (Signed)
NAME:  FAYLINN, SCHWENN             ACCOUNT NO.:  [id number]  MEDICAL RECORD NO.:  [id number]  LOCATION:                                 FACILITY:  PHYSICIAN:  Harvie Junior, M.D.   DATE OF BIRTH:  1955/06/27  DATE OF PROCEDURE:  06/22/2012 DATE OF DISCHARGE:                              OPERATIVE REPORT   PREOPERATIVE DIAGNOSIS:  Severe CMC joint arthritis, left.  POSTOPERATIVE DIAGNOSIS:  Severe CMC joint arthritis, left.  PROCEDURE: 1. Left CMC arthroplasty with flexor carpi radialis, harvesting with anchovy interposition. 2. Flexor tendon transfer of fcr tendon to base of 1st metacarpal SURGEON:  Harvie Junior, M.D.  ASSISTANT:  Marshia Ly, P.A.  ANESTHESIA:  General.  BRIEF HISTORY:  Mrs. Peace is a 57 year old female with history of having had a severe CMC arthroplasty.  We followed her for years with complaints of pain in this area.  X-ray showed severe bone-on-bone changes and after failure of all conservative care, she was allowed to taken to the operating room for left [facility] arthroplasty.  DESCRIPTION OF PROCEDURE:  The patient was taken to the operating room. After adequate anesthesia has been obtained with general anesthesia, the patient was placed supine on operating table.  The left arm was then prepped and draped in the usual sterile fashion.  Once this was completed, attention was turned towards the left arm where a small incision was made curvilinear along the base of the thumb down to the FCR tendon and this was exploited down to the area of the extensor tendon of the thumb and the capsule over the base of the thumb and the trapezium was opened and this was tacked.  Once this was done, the trapezium was excised in total and then attention was turned towards the drilling a hole through the dorsal aspect of the thumb perpendicular to the long axis of the thumb to the thumbnail and a drill hole was placed into the dorsal come down to the articular  surface.  This was expanded with a larger drill and then attention was turned proximally towards the FCR tendon.  The FCR was identified in the mid forearm and released proximally and distally.  The fascia and then a pigtail tendon stripper was used, pushed up to the elbow and this brought a large portion of the FCR tendon and the place were harvested all the muscle off with an osteotome.  Then, we pulled it distally and the wound carefully dissected the FCR tendon all the way to the base of the 2nd metacarpal. Once this was done, the tendon was passed through the base of the 1st metacarpal out the back and then sewn back on itself creating the arthroplasty.  The remaining portions of the of the tendon were then placed as an anchovy down into the base of the area of the [facility] and this was done with 4-0 FiberWire which had been placed, 2 of them in the base of the of the trapezial fossa.  Once this was passed over the [place] needles, this was tied down to the base of the wound, and then the second suture which had been placed in the opposite direction was then tied  over top of the anchovy.  At this point, care was taken to pull the thumb into extension and abduction prior to performing the suspension plasty and it was held in that position during the tying of all sutures. The periosteum was used to anchor the FCR tendon at the base of the thumb as well and then the capsule was reapproximated over the FCR tendon and the extensor tendon was reapproximated down off to the capsule.  Once this was completed, the wound was copiously and thoroughly irrigated, suctioned dry, and closed with a combination of interrupted and running sutures, subcuticular, benzoin and Steri-Strips were applied.  The proximal wound was closed as well and a short thumb spica splint with the thumb in abduction was placed and the patient was taken to recovery and was noted to be in satisfactory condition. Estimated blood  loss for procedure was none.     Harvie Junior, M.D.     Ranae Plumber  D:  06/22/2012  T:  06/23/2012  Job:  [id number]

## 2012-06-26 DIAGNOSIS — G894 Chronic pain syndrome: Secondary | ICD-10-CM | POA: Insufficient documentation

## 2014-03-21 ENCOUNTER — Encounter: Payer: Self-pay | Admitting: Gastroenterology

## 2014-04-14 ENCOUNTER — Telehealth: Payer: Self-pay | Admitting: Pulmonary Disease

## 2014-04-14 NOTE — Telephone Encounter (Signed)
error 

## 2015-03-16 ENCOUNTER — Other Ambulatory Visit: Payer: Self-pay | Admitting: Obstetrics and Gynecology

## 2015-03-16 DIAGNOSIS — Z13820 Encounter for screening for osteoporosis: Secondary | ICD-10-CM

## 2015-03-26 ENCOUNTER — Ambulatory Visit: Payer: Self-pay | Attending: Obstetrics and Gynecology

## 2015-04-08 ENCOUNTER — Ambulatory Visit
Admission: RE | Admit: 2015-04-08 | Discharge: 2015-04-08 | Disposition: A | Discharge status: Home / Self Care | Payer: 59 | Source: Ambulatory Visit | Attending: Obstetrics and Gynecology | Admitting: Obstetrics and Gynecology

## 2015-04-08 DIAGNOSIS — Z780 Asymptomatic menopausal state: Secondary | ICD-10-CM | POA: Insufficient documentation

## 2015-04-08 DIAGNOSIS — Z13820 Encounter for screening for osteoporosis: Secondary | ICD-10-CM | POA: Diagnosis not present

## 2015-07-09 ENCOUNTER — Other Ambulatory Visit: Payer: Self-pay | Admitting: Neurosurgery

## 2015-07-09 DIAGNOSIS — M4802 Spinal stenosis, cervical region: Secondary | ICD-10-CM

## 2015-07-18 ENCOUNTER — Ambulatory Visit
Admission: RE | Admit: 2015-07-18 | Discharge: 2015-07-18 | Disposition: A | Discharge status: Home / Self Care | Payer: Self-pay | Source: Ambulatory Visit | Attending: Neurosurgery | Admitting: Neurosurgery

## 2015-07-18 DIAGNOSIS — M4802 Spinal stenosis, cervical region: Secondary | ICD-10-CM

## 2015-11-03 DIAGNOSIS — G8929 Other chronic pain: Secondary | ICD-10-CM | POA: Insufficient documentation

## 2015-11-03 DIAGNOSIS — M797 Fibromyalgia: Secondary | ICD-10-CM | POA: Insufficient documentation

## 2015-11-03 DIAGNOSIS — K589 Irritable bowel syndrome without diarrhea: Secondary | ICD-10-CM | POA: Insufficient documentation

## 2015-11-03 DIAGNOSIS — F3340 Major depressive disorder, recurrent, in remission, unspecified: Secondary | ICD-10-CM | POA: Insufficient documentation

## 2016-01-26 DIAGNOSIS — R030 Elevated blood-pressure reading, without diagnosis of hypertension: Secondary | ICD-10-CM | POA: Insufficient documentation

## 2016-01-26 DIAGNOSIS — I8390 Asymptomatic varicose veins of unspecified lower extremity: Secondary | ICD-10-CM | POA: Insufficient documentation

## 2016-02-15 ENCOUNTER — Other Ambulatory Visit: Payer: Self-pay | Admitting: Obstetrics and Gynecology

## 2016-02-15 DIAGNOSIS — Z1231 Encounter for screening mammogram for malignant neoplasm of breast: Secondary | ICD-10-CM

## 2016-03-08 ENCOUNTER — Encounter: Payer: Self-pay | Admitting: Gastroenterology

## 2016-03-15 ENCOUNTER — Ambulatory Visit: Payer: Self-pay

## 2016-05-02 ENCOUNTER — Encounter: Payer: Self-pay | Admitting: Vascular Surgery

## 2016-08-16 ENCOUNTER — Encounter: Payer: Self-pay | Admitting: Obstetrics and Gynecology

## 2016-08-16 ENCOUNTER — Ambulatory Visit (INDEPENDENT_AMBULATORY_CARE_PROVIDER_SITE_OTHER): Payer: BLUE CROSS/BLUE SHIELD | Admitting: Obstetrics and Gynecology

## 2016-08-16 DIAGNOSIS — Z124 Encounter for screening for malignant neoplasm of cervix: Secondary | ICD-10-CM | POA: Diagnosis not present

## 2016-08-16 DIAGNOSIS — Z01419 Encounter for gynecological examination (general) (routine) without abnormal findings: Secondary | ICD-10-CM | POA: Diagnosis not present

## 2016-08-16 MED ORDER — ESTROGENS, CONJUGATED 0.625 MG/GM VA CREA: 1.0000 | TOPICAL_CREAM | VAGINAL | 1 refills | Status: AC
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
## 2016-08-16 NOTE — Patient Instructions (Signed)
Preventing Osteoporosis, Adult Osteoporosis is a condition that causes the bones to get weaker. With osteoporosis, the bones become thinner, and the normal spaces in bone tissue become larger. This can make the bones weak and cause them to break more easily. People who have osteoporosis are more likely to break their wrist, spine, or hip. Even a minor accident or injury can be enough to break weak bones. Osteoporosis can occur with aging. Your body constantly replaces old bone tissue with new tissue. As you get older, you may lose bone tissue more quickly, or it may be replaced more slowly. Osteoporosis is more likely to develop if you have poor nutrition or do not get enough calcium or vitamin D. Other lifestyle factors can also play a role. By making some diet and lifestyle changes, you can help to keep your bones healthy and help to prevent osteoporosis. What nutrition changes can be made? Nutrition plays an important role in maintaining healthy, strong bones.  Make sure you get enough calcium every day from food or from calcium supplements.  If you are age 31 or younger, aim to get 1,000 mg of calcium every day.  If you are older than age 75, aim to get 1,200 mg of calcium every day.  Try to get enough vitamin D every day.  If you are age 71 or younger, aim to get 600 international units (IU) every day.  If you are older than age 5, aim to get 800 international units every day.  Follow a healthy diet. Eat plenty of foods that contain calcium and vitamin D.  Calcium is in milk, cheese, yogurt, and other dairy products. Some fish and vegetables are also good sources of calcium. Many foods such as cereals and breads have had calcium added to them (are fortified). Check nutrition labels to see how much calcium is in a food or drink.  Foods that contain vitamin D include milk, cereals, salmon, and tuna. Your body also makes vitamin D when you are out in the sun. Bare skin exposure to the sun on  your face, arms, legs, or back for no more than 30 minutes a day, 2 times per week is more than enough. Beyond that, it is important to use sunblock to protect your skin from sunburn, which increases your risk for skin cancer. What lifestyle changes can be made? Making changes in your everyday life can also play an important role in preventing osteoporosis.  Stay active and get exercise every day. Ask your health care provider what types of exercise are best for you.  Do not use any products that contain nicotine or tobacco, such as cigarettes and e-cigarettes. If you need help quitting, ask your health care provider.  Limit alcohol intake to no more than 1 drink a day for nonpregnant women and 2 drinks a day for men. One drink equals 12 oz of beer, 5 oz of wine, or 1 oz of hard liquor. Why are these changes important? Making these nutrition and lifestyle changes can:  Help you develop and maintain healthy, strong bones.  Prevent loss of bone mass and the problems that are caused by that loss, such as broken bones and delayed healing.  Make you feel better mentally and physically. What can happen if changes are not made? Problems that can result from osteoporosis can be very serious. These may include:  A higher risk of broken bones that are painful and do not heal well.  Physical malformations, such as a collapsed spine  or a hunched back.  Problems with movement. Where to find support: If you need help making changes to prevent osteoporosis, talk with your health care provider. You can ask for a referral to a diet and nutrition specialist (dietitian) and a physical therapist. Where to find more information: Learn more about osteoporosis from:  NIH Osteoporosis and Related [place]: www.niams.GolfingGoddess.com.br  U.S. Office on Women's Health:  SouvenirBaseball.es.html  National Osteoporosis Foundation: ProfilePeek.ch Summary  Osteoporosis is a condition that causes weak bones that are more likely to break.  Eating a healthy diet and making sure you get enough calcium and vitamin D can help prevent osteoporosis.  Other ways to reduce your risk of osteoporosis include getting regular exercise and avoiding alcohol and products that contain nicotine or tobacco. This information is not intended to replace advice given to you by your health care provider. Make sure you discuss any questions you have with your health care provider. Document Released: 05/31/2015 Document Revised: 01/25/2016 Document Reviewed: 01/25/2016 Elsevier Interactive Patient Education  2017 [employer].

## 2016-08-19 LAB — PAPIG, HPV, RFX 16/18: PAP SMEAR COMMENT: 0

## 2016-08-19 LAB — HPV, LOW VOLUME (REFLEX): HPV, LOW VOL REFLEX: NEGATIVE

## 2016-08-29 ENCOUNTER — Encounter: Payer: Self-pay | Admitting: Obstetrics and Gynecology

## 2017-01-13 ENCOUNTER — Encounter: Payer: Self-pay | Admitting: Obstetrics and Gynecology

## 2017-01-16 NOTE — Telephone Encounter (Signed)
I think this message was for you

## 2018-02-05 DIAGNOSIS — Z124 Encounter for screening for malignant neoplasm of cervix: Secondary | ICD-10-CM | POA: Insufficient documentation

## 2018-02-05 DIAGNOSIS — Z0000 Encounter for general adult medical examination without abnormal findings: Secondary | ICD-10-CM | POA: Insufficient documentation

## 2018-02-05 DIAGNOSIS — E782 Mixed hyperlipidemia: Secondary | ICD-10-CM | POA: Insufficient documentation

## 2018-12-04 DIAGNOSIS — R1030 Lower abdominal pain, unspecified: Secondary | ICD-10-CM | POA: Insufficient documentation

## 2018-12-26 DIAGNOSIS — Z9889 Other specified postprocedural states: Secondary | ICD-10-CM | POA: Insufficient documentation

## 2019-02-07 DIAGNOSIS — R7303 Prediabetes: Secondary | ICD-10-CM | POA: Insufficient documentation

## 2020-02-06 DIAGNOSIS — L570 Actinic keratosis: Secondary | ICD-10-CM | POA: Diagnosis not present

## 2020-02-06 DIAGNOSIS — Z85820 Personal history of malignant melanoma of skin: Secondary | ICD-10-CM | POA: Diagnosis not present

## 2020-02-06 DIAGNOSIS — X32XXXD Exposure to sunlight, subsequent encounter: Secondary | ICD-10-CM | POA: Diagnosis not present

## 2020-02-06 DIAGNOSIS — Z08 Encounter for follow-up examination after completed treatment for malignant neoplasm: Secondary | ICD-10-CM | POA: Diagnosis not present

## 2020-02-06 DIAGNOSIS — Z1283 Encounter for screening for malignant neoplasm of skin: Secondary | ICD-10-CM | POA: Diagnosis not present

## 2020-02-20 DIAGNOSIS — F3340 Major depressive disorder, recurrent, in remission, unspecified: Secondary | ICD-10-CM | POA: Diagnosis not present

## 2020-03-03 DIAGNOSIS — H2513 Age-related nuclear cataract, bilateral: Secondary | ICD-10-CM | POA: Diagnosis not present

## 2020-03-03 DIAGNOSIS — H2511 Age-related nuclear cataract, right eye: Secondary | ICD-10-CM | POA: Diagnosis not present

## 2020-03-03 DIAGNOSIS — H25013 Cortical age-related cataract, bilateral: Secondary | ICD-10-CM | POA: Diagnosis not present

## 2020-03-03 DIAGNOSIS — H25043 Posterior subcapsular polar age-related cataract, bilateral: Secondary | ICD-10-CM | POA: Diagnosis not present

## 2020-03-03 DIAGNOSIS — H18413 Arcus senilis, bilateral: Secondary | ICD-10-CM | POA: Diagnosis not present

## 2020-05-04 DIAGNOSIS — Z961 Presence of intraocular lens: Secondary | ICD-10-CM | POA: Diagnosis not present

## 2020-05-04 DIAGNOSIS — H2513 Age-related nuclear cataract, bilateral: Secondary | ICD-10-CM | POA: Diagnosis not present

## 2020-05-04 DIAGNOSIS — H2511 Age-related nuclear cataract, right eye: Secondary | ICD-10-CM | POA: Diagnosis not present

## 2020-05-05 DIAGNOSIS — H2512 Age-related nuclear cataract, left eye: Secondary | ICD-10-CM | POA: Diagnosis not present

## 2020-05-21 DIAGNOSIS — F3340 Major depressive disorder, recurrent, in remission, unspecified: Secondary | ICD-10-CM | POA: Diagnosis not present

## 2020-05-29 DIAGNOSIS — H2512 Age-related nuclear cataract, left eye: Secondary | ICD-10-CM | POA: Diagnosis not present

## 2020-05-29 DIAGNOSIS — H2513 Age-related nuclear cataract, bilateral: Secondary | ICD-10-CM | POA: Diagnosis not present

## 2020-05-29 DIAGNOSIS — Z961 Presence of intraocular lens: Secondary | ICD-10-CM | POA: Diagnosis not present

## 2020-07-23 DIAGNOSIS — R399 Unspecified symptoms and signs involving the genitourinary system: Secondary | ICD-10-CM | POA: Insufficient documentation

## 2020-09-11 ENCOUNTER — Other Ambulatory Visit: Payer: Self-pay | Admitting: Rehabilitation

## 2020-09-11 DIAGNOSIS — M48062 Spinal stenosis, lumbar region with neurogenic claudication: Secondary | ICD-10-CM

## 2020-09-29 ENCOUNTER — Other Ambulatory Visit: Payer: Self-pay

## 2020-09-29 ENCOUNTER — Ambulatory Visit
Admission: RE | Admit: 2020-09-29 | Discharge: 2020-09-29 | Disposition: A | Discharge status: Home / Self Care | Payer: Medicare Other | Source: Ambulatory Visit | Attending: Rehabilitation | Admitting: Rehabilitation

## 2020-09-29 DIAGNOSIS — M48062 Spinal stenosis, lumbar region with neurogenic claudication: Secondary | ICD-10-CM

## 2020-09-30 DIAGNOSIS — R590 Localized enlarged lymph nodes: Secondary | ICD-10-CM | POA: Insufficient documentation

## 2020-10-07 ENCOUNTER — Telehealth: Payer: Self-pay | Admitting: Hematology and Oncology

## 2020-10-07 NOTE — Telephone Encounter (Signed)
Patient referred by Dr Teressa Lower for Retroperitoneal Lymphadenopathy.  Appt made for 10/09/20 Labs 1:00 pm - Consult 1:30 pm.  Patient stated she just had Labs drawn - Does she need this Lab Appt?

## 2020-10-08 ENCOUNTER — Encounter: Payer: Self-pay | Admitting: Hematology and Oncology

## 2020-10-08 ENCOUNTER — Telehealth: Payer: Self-pay | Admitting: Hematology and Oncology

## 2020-10-08 ENCOUNTER — Other Ambulatory Visit: Payer: Self-pay

## 2020-10-08 ENCOUNTER — Inpatient Hospital Stay: Payer: Medicare Other | Attending: Hematology and Oncology | Admitting: Hematology and Oncology

## 2020-10-08 ENCOUNTER — Inpatient Hospital Stay: Payer: Medicare Other

## 2020-10-08 VITALS — BP 160/72 | HR 78 | Temp 98.7°F | Resp 18 | Ht 63.0 in | Wt 148.3 lb

## 2020-10-08 DIAGNOSIS — R590 Localized enlarged lymph nodes: Secondary | ICD-10-CM | POA: Diagnosis not present

## 2020-10-08 NOTE — Progress Notes (Signed)
[facility]  [address] [place],  [place]  [address] [phone]  Clinic Day:  10/08/2020  Referring physician: Algis Greenhouse, MD   CHIEF COMPLAINT:  CC: A 65 year old female with newly found retroperitoneal lymphadenopathy on CT abdomen/ pelvis here for further evaluation.  Current Treatment:  Diagnostics   HISTORY OF PRESENT ILLNESS:  [name] [name] is a 65 y.o. female with a history of back pain  who is referred in consultation with [name] [name] for assessment and management. [name] [name]'s history includes years of low back pain from a fall in 2020 for which injections have not been working. She underwent MRI that showed incidental findings of retroperitoneal lymphadenopathy. A CT abdominal/ pelvis was then ordered which revealed large calcified retroperitoneal lymph nodes highly concerning for metastatic disease, likely related to primary carcinoma of the colon or lymphoma. Clinical correlation is recommended. No bowel obstruction. Normal appendix. Stable right hepatic dome hemangioma.  Her medical history consists of varicose veins, IBS, osteopenia, fibromyositis, ADD, depression, chronic low back pain, prediabetes, right breast mass (benign), melanoma x 2, and constipation. Her surgical history is significant for catatacts, cervical fusion, hand surgery, lumbar surgery, ankle surgery, breast implants, carpal tunnel surgery, D&C, hysteroscopy with ablation, T&A, and wisdom tooth extraction. Her family history is significant for lung cancer with her father who was a heavy smoker. 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 She is up to date on her pap and colonoscopy. She is due mammogram in July and is past due for dexascan.   Today she complains of fatigue, left sided back pain that radiates to her left flank, abdomen, groin and down her left leg. She has increased constipation as well as bloating and weight loss. She  denies fever, chills, nausea or vomiting. She denies shortness of breath, chest pain or cough. She states she had a right breast cyst that was found to be benign.    REVIEW OF SYSTEMS:  Review of Systems  Constitutional: Positive for appetite change, fatigue and unexpected weight change. Negative for chills, diaphoresis and fever.  HENT:   Negative for hearing loss, lump/mass, mouth sores, nosebleeds, sore throat, tinnitus, trouble swallowing and voice change.   Eyes: Negative for eye problems and icterus.  Respiratory: Negative for chest tightness, cough, hemoptysis, shortness of breath and wheezing.   Cardiovascular: Negative for chest pain, leg swelling and palpitations.  Gastrointestinal: Positive for abdominal distention, abdominal pain and constipation. Negative for blood in stool, diarrhea, nausea, rectal pain and vomiting.  Endocrine: Negative for hot flashes.  Genitourinary: Negative for bladder incontinence, difficulty urinating, dyspareunia, dysuria, frequency, hematuria and nocturia.   Musculoskeletal: Positive for back pain and flank pain. Negative for arthralgias, gait problem, myalgias, neck pain and neck stiffness.  Skin: Negative for itching, rash and wound.  Neurological: Negative for dizziness, extremity weakness, gait problem, headaches, light-headedness, numbness, seizures and speech difficulty.  Hematological: Negative for adenopathy. Does not bruise/bleed easily.  Psychiatric/Behavioral: Negative for confusion, decreased concentration, depression, sleep disturbance and suicidal ideas. The patient is not nervous/anxious.      VITALS:  Blood pressure (!) 160/72, pulse 78, temperature 98.7 F (37.1 C), temperature source Oral, resp. rate 18, height 5\' 3"  (1.6 m), weight 148 lb 4.8 oz (67.3 kg), SpO2 98 %.  Wt Readings from Last 3 Encounters:  10/08/20 148 lb 4.8 oz (67.3 kg)  08/16/16 159 lb (72.1 kg)  06/19/12 157 lb (71.2 kg)  Body mass index is 26.27  kg/m.  Performance status (ECOG): 1 - Symptomatic but completely ambulatory  PHYSICAL EXAM:  Physical Exam Constitutional:      General: She is not in acute distress.    Appearance: Normal appearance. She is normal weight. She is not ill-appearing, toxic-appearing or diaphoretic.  HENT:     Head: Normocephalic and atraumatic.     Right Ear: Tympanic membrane normal.     Left Ear: Tympanic membrane normal.     Nose: Nose normal. No congestion or rhinorrhea.     Mouth/Throat:     Mouth: Mucous membranes are moist.     Pharynx: Oropharynx is clear. No oropharyngeal exudate or posterior oropharyngeal erythema.  Eyes:     General: No scleral icterus.       Right eye: No discharge.        Left eye: No discharge.     Extraocular Movements: Extraocular movements intact.     Conjunctiva/sclera: Conjunctivae normal.     Pupils: Pupils are equal, round, and reactive to light.  Neck:     Vascular: No carotid bruit.  Cardiovascular:     Rate and Rhythm: Normal rate and regular rhythm.     Heart sounds: No murmur heard. No friction rub. No gallop.   Pulmonary:     Effort: Pulmonary effort is normal. No respiratory distress.     Breath sounds: Normal breath sounds. No stridor. No wheezing, rhonchi or rales.  Chest:     Chest wall: No tenderness.  Abdominal:     General: Abdomen is flat. Bowel sounds are normal. There is distension.     Palpations: There is no mass.     Tenderness: There is abdominal tenderness. There is guarding. There is no right CVA tenderness, left CVA tenderness or rebound.     Hernia: No hernia is present.  Musculoskeletal:        General: No swelling, tenderness, deformity or signs of injury. Normal range of motion.     Cervical back: Normal range of motion and neck supple. No rigidity or tenderness.     Right [name] leg: No edema.     Left [name] leg: No edema.  Lymphadenopathy:     Cervical: No cervical adenopathy.  Skin:    General: Skin is warm and dry.      Capillary Refill: Capillary refill takes less than 2 seconds.     Coloration: Skin is not jaundiced or pale.     Findings: No bruising, erythema, lesion or rash.  Neurological:     General: No focal deficit present.     Mental Status: She is alert and oriented to person, place, and time. Mental status is at baseline.     Cranial Nerves: No cranial nerve deficit.     Sensory: No sensory deficit.     Motor: No weakness.     Coordination: Coordination normal.     Gait: Gait normal.     Deep Tendon Reflexes: Reflexes normal.  Psychiatric:        Mood and Affect: Mood normal.        Behavior: Behavior normal.        Thought Content: Thought content normal.        Judgment: Judgment normal.    Lymph nodes:   There is no cervical, clavicular, axillary or lymphadenopathy.  LABS:   CBC Latest Ref Rng & Units 06/22/2012 11/23/2007 11/23/2007  WBC - - - 12.4(H)  Hemoglobin 12.0 - 15.0 g/dL 11.6(L) 14.3  13.9  Hematocrit - - 42.0 40.6  Platelets - - - 224   CMP 11/23/2007  Glucose 109(H)  BUN 11  Creatinine 1.1  Sodium 136  Potassium 3.3(L)  Chloride 103     No results found for: CEA1 / No results found for: CEA1 No results found for: PSA1 No results found for: VOZ366 No results found for: CAN125  No results found for: TOTALPROTELP, ALBUMINELP, A1GS, A2GS, BETS, BETA2SER, GAMS, MSPIKE, SPEI No results found for: TIBC, FERRITIN, IRONPCTSAT No results found for: LDH  STUDIES:  MR LUMBAR SPINE WO CONTRAST  Result Date: 09/29/2020 CLINICAL DATA:  Central low back pain radiating to both hips and the sacral area, 2 years duration. EXAM: MRI LUMBAR SPINE WITHOUT CONTRAST TECHNIQUE: Multiplanar, multisequence MR imaging of the lumbar spine was performed. No intravenous contrast was administered. COMPARISON:  None. FINDINGS: Segmentation:  5 lumbar type vertebral bodies assumed. Alignment: Scoliotic curvature convex to the right with the apex at L3. 2 mm degenerative anterolisthesis L4-5.  Vertebrae: No fracture or focal bone lesion. No regional bone edema. Conus medullaris and cauda equina: Conus extends to the L1 level. Conus and cauda equina appear normal. Paraspinal and other soft tissues: Massive retroperitoneal lymphadenopathy. This is quite likely related to malignant disease. Disc levels: T11-12: Central disc herniation. Facet and ligamentous hypertrophy. Mild canal stenosis. This level was not studied in detail. T12-L1: Normal appearance of the disc. Mild facet hypertrophy. No compressive stenosis. L1-2: Mild bulging of the disc. Facet and ligamentous hypertrophy. Mild narrowing of the right lateral recess, not likely compressive. L2-3: Bulging of the disc. Mild facet hypertrophy. Mild narrowing of the lateral recesses left more than right, not likely compressive. L3-4: Mild bulging of the disc. Facet and ligamentous hypertrophy. Narrowing of the lateral recesses left more than right. Neural compression could possibly occur in the left lateral recess. L4-5: Previous posterior decompression. Bilateral facet arthropathy with 2 mm of anterolisthesis. Mild bulging of the disc. Mild stenosis of the lateral recesses and neural foramina but without definite neural compression. L5-S1: Previous posterior decompression. Bulging of the disc. Mild facet hypertrophy. No apparent compressive stenosis. IMPRESSION: The dominant finding in this case is that of massive retroperitoneal lymphadenopathy, likely related to either metastatic disease or lymphoma. Chronic degenerative and postoperative findings of the spine as outlined in detail above. These results will be called to the ordering clinician or representative by the Radiologist Assistant, and communication documented in the PACS or [employer]. Electronically Signed   By: Nelson Chimes M.D.   On: 09/29/2020 12:00    Exam(s): [id number] CT/CT ABD-PELV W/IV CM CLINICAL DATA:  65 year old female with left upper quadrant abdominal pain.  EXAM: CT  ABDOMEN AND PELVIS WITH CONTRAST  TECHNIQUE: Multidetector CT imaging of the abdomen and pelvis was performed using the standard protocol following bolus administration of intravenous contrast.  CONTRAST:  100 cc Isovue 370  COMPARISON:  CT abdomen pelvis dated 04/04/2006.  FINDINGS: [name] chest: The visualized lung bases are clear.  No intra-abdominal free air or free fluid.  Hepatobiliary: There is a 2.3 x 2.0 cm low attenuating lesion with peripheral nodular enhancement in the dome of the liver (segment VII), and not significantly changed since the study of 2007 most consistent with a hemangioma. The liver is otherwise unremarkable. No intrahepatic biliary dilatation. The gallbladder is unremarkable. The  Pancreas: Unremarkable. No pancreatic ductal dilatation or surrounding inflammatory changes.  Spleen: Normal in size without focal abnormality.  Adrenals/Urinary Tract: The adrenal glands unremarkable. There  is no hydronephrosis on either side. There is symmetric enhancement and excretion of contrast by both kidneys. The visualized ureters and urinary bladder appear unremarkable.  Stomach/Bowel: There is no bowel obstruction or active inflammation. The appendix is normal.  Vascular/Lymphatic: The abdominal aorta and IVC unremarkable. Large calcified retroperitoneal lymph nodes with associated mass effect on the IVC of indeterminate etiology but highly concerning for metastatic disease, likely related to primary carcinoma of the colon versus malignant lymphoma. Clinical correlation is recommended. A cluster of calcified mass measures approximately 4 x 6 cm (40/2).  Reproductive: The uterus is anteverted and grossly unremarkable. No adnexal masses.  Other: Partially visualized bilateral breast implants.  Musculoskeletal: Degenerative changes of the spine. No acute osseous pathology.  IMPRESSION: 1. Large calcified retroperitoneal lymph nodes highly concerning  for metastatic disease, likely related to primary carcinoma of the colon or lymphoma. Clinical correlation is recommended. 2. No bowel obstruction. Normal appendix. 3. Stable right hepatic dome hemangioma.   Electronically Signed   By: Anner Crete M.D.   On: 10/05/2020 00:23  Electronically Signed By: Anner Crete MD  Electronically Signed Date/Time: 05/09/220026 Dictate Date/Time: 10/05/20 0010   HISTORY:   Past Medical History:  Diagnosis Date  . Arthritis   . Chronic pain   . Contact lens/glasses fitting    wears contacts or glasses  . Depression   . Fibromyalgia   . PONV (postoperative nausea and vomiting)     Past Surgical History:  Procedure Laterality Date  . BACK SURGERY     lumb lam  . BREAST ENHANCEMENT SURGERY  1982   implants   . BREAST IMPLANT EXCHANGE  2011  . CARPAL TUNNEL RELEASE  2003   rt and lt  . CARPOMETACARPEL SUSPENSION PLASTY  06/22/2012   Procedure: CARPOMETACARPEL ([place]) SUSPENSION PLASTY;  Surgeon: Alta Corning, MD;  Location: [facility];  Service: Orthopedics;  Laterality: Left;  burton's interposition arthroplasty left thumb   . DIAGNOSTIC LAPAROSCOPY  2002   expl lap  . DILATION AND CURETTAGE OF UTERUS  2003   ablation  . ELBOW SURGERY  2001   decompression rt  . EPIDURAL BLOCK INJECTION     multiple  . TONSILLECTOMY      Family History  Problem Relation Age of Onset  . Lung cancer Father     Social History:  reports that she has never smoked. She has never used smokeless tobacco. She reports current alcohol use. She reports that she does not use drugs.The patient is alone  today.  Allergies:  Allergies  Allergen Reactions  . Ciprofloxacin-Ciproflox Hcl Er Nausea And Vomiting  . Codeine Nausea And Vomiting  . Latex Rash    Current Medications: Current Outpatient Medications  Medication Sig Dispense Refill  . FLUoxetine (PROZAC) 20 MG tablet Take 30 mg by mouth every morning.    . traMADol  (ULTRAM) 50 MG tablet SMARTSIG:1 Tablet(s) By Mouth Every 12 Hours PRN     No current facility-administered medications for this visit.     ASSESSMENT & PLAN:   Assessment:  [name] [name] is a 65 y.o. female with recently discovered retroperitoneal lymphadenopathy found on CT imaging worrisome for metastatic disease. We had a long discussion today regarding her recent findings. She understands that further diagnostics are required to move forward. As her chest has not been evaluated, we would like to proceed with a PET scan to further assess for disease status. She understands that if the PET scan indicates a concern for malignancy,  the next step will involve a tissue biopsy. She is agreeable to this plan of care. We discussed the instructions for a PET scan to include a low sugar/ carb diet 24 hours prior with fasting after midnight.   Plan: We will obtain authorization for PET imaging and schedule for next available appointment regardless of location. Once imaging results are obtained, she will return to clinic for review of imaging and continued plan of care which may include scheduling of tissue biopsy.  She verbalizes understanding of and agreement to the plans discussed today. She knows to call the office should any new questions or concerns arise.   Thank you for the opportunity       Melodye Ped, NP

## 2020-10-08 NOTE — Telephone Encounter (Signed)
Per 5/12 LOS, patient scheduled for 5/26 Follow Up for PET Scan Results.  Patient wrote Appt on Card

## 2020-10-09 ENCOUNTER — Other Ambulatory Visit: Payer: Medicare Other

## 2020-10-09 ENCOUNTER — Ambulatory Visit: Payer: Medicare Other | Admitting: Hematology and Oncology

## 2020-10-14 ENCOUNTER — Telehealth: Payer: Self-pay | Admitting: Hematology and Oncology

## 2020-10-14 NOTE — Telephone Encounter (Signed)
Patient notified of scheduled 5/23 PET Scan at 6:00 am to check-in at 5/30 am

## 2020-10-19 ENCOUNTER — Other Ambulatory Visit: Payer: Self-pay | Admitting: Hematology and Oncology

## 2020-10-19 DIAGNOSIS — R590 Localized enlarged lymph nodes: Secondary | ICD-10-CM

## 2020-10-22 ENCOUNTER — Inpatient Hospital Stay: Payer: Medicare Other | Admitting: Hematology and Oncology

## 2020-10-30 ENCOUNTER — Ambulatory Visit (HOSPITAL_COMMUNITY)
Admission: RE | Admit: 2020-10-30 | Discharge: 2020-10-30 | Disposition: A | Discharge status: Home / Self Care | Payer: Medicare Other | Source: Ambulatory Visit | Attending: Hematology and Oncology | Admitting: Hematology and Oncology

## 2020-10-30 ENCOUNTER — Other Ambulatory Visit: Payer: Self-pay

## 2020-10-30 DIAGNOSIS — R590 Localized enlarged lymph nodes: Secondary | ICD-10-CM | POA: Insufficient documentation

## 2020-10-30 LAB — GLUCOSE, CAPILLARY: Glucose-Capillary: 99 mg/dL (ref 70–99)

## 2020-10-30 MED ORDER — FLUDEOXYGLUCOSE F - 18 (FDG) INJECTION
7.0000 | Freq: Once | INTRAVENOUS | Status: AC
  Administered 2020-10-30: 7 via INTRAVENOUS

## 2020-11-02 ENCOUNTER — Other Ambulatory Visit: Payer: Self-pay

## 2020-11-02 ENCOUNTER — Inpatient Hospital Stay: Payer: Medicare Other

## 2020-11-02 ENCOUNTER — Other Ambulatory Visit: Payer: Self-pay | Admitting: Hematology and Oncology

## 2020-11-02 ENCOUNTER — Inpatient Hospital Stay: Payer: Medicare Other | Attending: Hematology and Oncology | Admitting: Hematology and Oncology

## 2020-11-02 VITALS — BP 141/73 | HR 63 | Temp 98.6°F | Resp 16 | Ht 63.0 in | Wt 141.0 lb

## 2020-11-02 DIAGNOSIS — M549 Dorsalgia, unspecified: Secondary | ICD-10-CM | POA: Insufficient documentation

## 2020-11-02 DIAGNOSIS — Z79899 Other long term (current) drug therapy: Secondary | ICD-10-CM | POA: Insufficient documentation

## 2020-11-02 DIAGNOSIS — R590 Localized enlarged lymph nodes: Secondary | ICD-10-CM | POA: Diagnosis not present

## 2020-11-02 DIAGNOSIS — Z9882 Breast implant status: Secondary | ICD-10-CM | POA: Insufficient documentation

## 2020-11-02 DIAGNOSIS — M797 Fibromyalgia: Secondary | ICD-10-CM | POA: Insufficient documentation

## 2020-11-02 DIAGNOSIS — R140 Abdominal distension (gaseous): Secondary | ICD-10-CM | POA: Insufficient documentation

## 2020-11-02 DIAGNOSIS — F32A Depression, unspecified: Secondary | ICD-10-CM | POA: Insufficient documentation

## 2020-11-02 DIAGNOSIS — R194 Change in bowel habit: Secondary | ICD-10-CM | POA: Insufficient documentation

## 2020-11-02 DIAGNOSIS — C569 Malignant neoplasm of unspecified ovary: Secondary | ICD-10-CM | POA: Insufficient documentation

## 2020-11-02 DIAGNOSIS — C579 Malignant neoplasm of female genital organ, unspecified: Secondary | ICD-10-CM | POA: Insufficient documentation

## 2020-11-02 DIAGNOSIS — R634 Abnormal weight loss: Secondary | ICD-10-CM | POA: Insufficient documentation

## 2020-11-02 DIAGNOSIS — Z801 Family history of malignant neoplasm of trachea, bronchus and lung: Secondary | ICD-10-CM | POA: Insufficient documentation

## 2020-11-02 DIAGNOSIS — G8929 Other chronic pain: Secondary | ICD-10-CM | POA: Insufficient documentation

## 2020-11-02 DIAGNOSIS — C775 Secondary and unspecified malignant neoplasm of intrapelvic lymph nodes: Secondary | ICD-10-CM | POA: Insufficient documentation

## 2020-11-02 NOTE — Progress Notes (Signed)
[name] [name]  [address] [place],  [place]  [address] [phone]  Clinic Day:  11/02/2020  Referring physician: Algis Greenhouse, MD   CHIEF COMPLAINT:  CC: A 65 year old female with history of retroperitoneal adenopathy here for PET imaging review.  Current Treatment:  Diagnostic   HISTORY OF PRESENT ILLNESS:  [name] [name] is a 65 y.o. female with a history of back pain  who is referred in consultation with [name] [name] for assessment and management. [name] [name]'s history includes years of low back pain from a fall in 2020 for which injections have not been working. She underwent MRI that showed incidental findings of retroperitoneal lymphadenopathy. A CT abdominal/ pelvis was then ordered which revealed large calcified retroperitoneal lymph nodes highly concerning for metastatic disease, likely related to primary carcinoma of the colon or lymphoma. Clinical correlation is recommended. No bowel obstruction. Normal appendix. Stable right hepatic dome hemangioma.   Her medical history consists of varicose veins, IBS, osteopenia, fibromyositis, ADD, depression, chronic low back pain, prediabetes, right breast mass (benign), melanoma x 2, and constipation. Her surgical history is significant for catatacts, cervical fusion, hand surgery, lumbar surgery, ankle surgery, breast implants, carpal tunnel surgery, D&C, hysteroscopy with ablation, T&A, and wisdom tooth extraction. Her family history is significant for lung cancer with her father who was a heavy smoker. 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 She is up to date on her pap and colonoscopy. She is due mammogram in July and is past due for dexascan.    Today she complains of fatigue, left sided back pain that radiates to her left flank, abdomen, groin and down her left leg. She has increased constipation as well as bloating and weight loss. She denies fever, chills, nausea  or vomiting. She denies shortness of breath, chest pain or cough. She states she had a right breast cyst that was found to be benign [name] [name] is a 65 y.o. female with recently discovered retroperitoneal lymphadenopathy found on CT imaging worrisome for metastatic disease. We had a long discussion today regarding her recent findings. She understands that further diagnostics are required to move forward. As her chest has not been evaluated, we would like to proceed with a PET scan to further assess for disease status. She understands that if the PET scan indicates a concern for malignancy, the next step will involve a tissue biopsy. She is agreeable to this plan of care. We discussed the instructions for a PET scan to include a low sugar/ carb diet 24 hours prior with fasting after midnight.    INTERVAL HISTORY:  [name] [name] is here today to review PET imaging which reveals the densely calcified retroperitoneal adenopathy is highly hypermetabolic with maximum SUV of 23.4 (Deauville 5) there is also Deauville 3 activity in a small right axillary lymph node. Given the lack of an obvious primary outside of the retroperitoneum, appearance tends to favor lymphoma or Castleman's disease. Tissue diagnosis suggested. Today, she denies fever, chills, nausea or vomiting. She denies shortness of breath, chest pain or cough. She denies issue with bowel or bladder. CBC and CMP are unremarkable. REVIEW OF SYSTEMS:  Review of Systems  Constitutional:  Negative for appetite change, chills, diaphoresis, fatigue, fever and unexpected weight change.  HENT:   Negative for hearing loss, lump/mass, mouth sores, nosebleeds, sore throat, tinnitus, trouble swallowing and voice change.   Eyes:  Negative for eye problems and icterus.  Respiratory:  Negative for chest tightness, cough, hemoptysis, shortness of breath and wheezing.   Cardiovascular:  Negative for chest pain, leg swelling and palpitations.  Gastrointestinal:   Negative for abdominal distention, abdominal pain, blood in stool, constipation, diarrhea, nausea, rectal pain and vomiting.  Endocrine: Negative for hot flashes.  Genitourinary:  Negative for bladder incontinence, difficulty urinating, dyspareunia, dysuria, frequency, hematuria and nocturia.   Musculoskeletal:  Negative for arthralgias, back pain, flank pain, gait problem, myalgias, neck pain and neck stiffness.  Skin:  Negative for itching, rash and wound.  Neurological:  Negative for dizziness, extremity weakness, gait problem, headaches, light-headedness, numbness, seizures and speech difficulty.  Hematological:  Negative for adenopathy. Does not bruise/bleed easily.  Psychiatric/Behavioral:  Negative for confusion, decreased concentration, depression, sleep disturbance and suicidal ideas. The patient is not nervous/anxious.     VITALS:  There were no vitals taken for this visit.  Wt Readings from Last 3 Encounters:  10/08/20 148 lb 4.8 oz (67.3 kg)  08/16/16 159 lb (72.1 kg)  06/19/12 157 lb (71.2 kg)    There is no height or weight on file to calculate BMI.  Performance status (ECOG): 1 - Symptomatic but completely ambulatory  PHYSICAL EXAM:  Physical Exam Constitutional:      General: She is not in acute distress.    Appearance: Normal appearance. She is normal weight. She is not ill-appearing, toxic-appearing or diaphoretic.  HENT:     Head: Normocephalic and atraumatic.     Nose: Nose normal. No congestion or rhinorrhea.     Mouth/Throat:     Mouth: Mucous membranes are moist.     Pharynx: Oropharynx is clear. No oropharyngeal exudate or posterior oropharyngeal erythema.  Eyes:     General: No scleral icterus.       Right eye: No discharge.        Left eye: No discharge.     Extraocular Movements: Extraocular movements intact.     Conjunctiva/sclera: Conjunctivae normal.     Pupils: Pupils are equal, round, and reactive to light.  Neck:     Vascular: No carotid bruit.   Cardiovascular:     Rate and Rhythm: Normal rate and regular rhythm.     Heart sounds: No murmur heard.   No friction rub. No gallop.  Pulmonary:     Effort: Pulmonary effort is normal. No respiratory distress.     Breath sounds: Normal breath sounds. No stridor. No wheezing, rhonchi or rales.  Chest:     Chest wall: No tenderness.  Abdominal:     General: Abdomen is flat. Bowel sounds are normal. There is no distension.     Palpations: There is no mass.     Tenderness: There is no abdominal tenderness. There is no right CVA tenderness, left CVA tenderness, guarding or rebound.     Hernia: No hernia is present.  Musculoskeletal:        General: No swelling, tenderness, deformity or signs of injury. Normal range of motion.     Cervical back: Normal range of motion and neck supple. No rigidity or tenderness.     Right [name] leg: No edema.     Left [name] leg: No edema.  Lymphadenopathy:     Cervical: No cervical adenopathy.  Skin:    General: Skin is warm and dry.     Capillary Refill: Capillary refill takes less than 2 seconds.     Coloration: Skin is not jaundiced or pale.     Findings: No bruising, erythema, lesion or  rash.  Neurological:     General: No focal deficit present.     Mental Status: She is alert and oriented to person, place, and time. Mental status is at baseline.     Cranial Nerves: No cranial nerve deficit.     Sensory: No sensory deficit.     Motor: No weakness.     Coordination: Coordination normal.     Gait: Gait normal.     Deep Tendon Reflexes: Reflexes normal.  Psychiatric:        Mood and Affect: Mood normal.        Behavior: Behavior normal.        Thought Content: Thought content normal.        Judgment: Judgment normal.    LABS:   CBC Latest Ref Rng & Units 06/22/2012 11/23/2007 11/23/2007  WBC - - - 12.4(H)  Hemoglobin 12.0 - 15.0 g/dL 11.6(L) 14.3 13.9  Hematocrit - - 42.0 40.6  Platelets - - - 224   CMP 11/23/2007  Glucose 109(H)  BUN 11   Creatinine 1.1  Sodium 136  Potassium 3.3(L)  Chloride 103     No results found for: CEA1 / No results found for: CEA1 No results found for: PSA1 No results found for: EPP295 No results found for: CAN125  No results found for: TOTALPROTELP, ALBUMINELP, A1GS, A2GS, BETS, BETA2SER, GAMS, MSPIKE, SPEI No results found for: TIBC, FERRITIN, IRONPCTSAT No results found for: LDH  STUDIES:  NM PET Image Restag (PS) Skull Base To Thigh  Result Date: 10/30/2020 CLINICAL DATA:  Initial treatment strategy for retroperitoneal adenopathy. EXAM: NUCLEAR MEDICINE PET SKULL BASE TO THIGH TECHNIQUE: 7.0 mCi F-18 FDG was injected intravenously. Full-ring PET imaging was performed from the skull base to thigh after the radiotracer. CT data was obtained and used for attenuation correction and anatomic localization. Fasting blood glucose: 99 mg/dl COMPARISON:  Multiple exams, including CT abdomen 10/02/2020 FINDINGS: Mediastinal blood pool activity: SUV max 2.5 Liver activity: SUV max 3.5 NECK: No significant abnormal hypermetabolic activity in this region. Incidental CT findings: Chronic left maxillary sinusitis. CHEST: 0.7 cm right axillary lymph node has maximum SUV of 3.5, Deauville 3. Incidental CT findings: Bilateral breast implants with dense capsular calcifications. Left anterior descending coronary artery atherosclerosis. Mitral valve calcifications noted. ABDOMEN/PELVIS: Prominently calcified retroperitoneal adenopathy is highly hypermetabolic, maximum SUV 18.8 (Deauville 5). This is primarily periaortic and extends down to the bifurcation. The spleen measures 10.9 by 6.7 by 10.2 cm (volume = 390 cm^3), upper normal size. No focal abnormal splenic activity. Incidental CT findings: Aortoiliac atherosclerotic vascular disease. Normal appendix. There are few scattered sigmoid colon diverticula. SKELETON: No significant abnormal hypermetabolic activity in this region. Incidental CT findings: Anterior plate and  screw fixator at C6-7. Grade 1 degenerative anterolisthesis at L4-5 with posterior decompression at the L4-5 level. IMPRESSION: 1. The densely calcified retroperitoneal adenopathy is highly hypermetabolic with maximum SUV of 23.4 (Deauville 5) there is also Deauville 3 activity in a small right axillary lymph node. Given the lack of an obvious primary outside of the retroperitoneum, appearance tends to favor lymphoma or Castleman's disease. Tissue diagnosis suggested. 2. Other imaging findings of potential clinical significance: Chronic left maxillary sinusitis. Aortic Atherosclerosis (ICD10-I70.0). Coronary atherosclerosis. Electronically Signed   By: Van Clines M.D.   On: 10/30/2020 16:45      HISTORY:   Past Medical History:  Diagnosis Date   Arthritis    Chronic pain    Contact lens/glasses fitting    wears contacts  or glasses   Depression    Fibromyalgia    PONV (postoperative nausea and vomiting)     Past Surgical History:  Procedure Laterality Date   BACK SURGERY     lumb lam   BREAST ENHANCEMENT SURGERY  1982   implants    BREAST IMPLANT EXCHANGE  2011   CARPAL TUNNEL RELEASE  2003   rt and lt   CARPOMETACARPEL SUSPENSION PLASTY  06/22/2012   Procedure: CARPOMETACARPEL ([place]) SUSPENSION PLASTY;  Surgeon: Alta Corning, MD;  Location: [facility];  Service: Orthopedics;  Laterality: Left;  burton's interposition arthroplasty left thumb    DIAGNOSTIC LAPAROSCOPY  2002   expl lap   DILATION AND CURETTAGE OF UTERUS  2003   ablation   ELBOW SURGERY  2001   decompression rt   EPIDURAL BLOCK INJECTION     multiple   TONSILLECTOMY      Family History  Problem Relation Age of Onset   Lung cancer Father     Social History:  reports that she has never smoked. She has never used smokeless tobacco. She reports current alcohol use. She reports that she does not use drugs.The patient is alone  today.  Allergies:  Allergies  Allergen Reactions    Ciprofloxacin-Ciproflox Hcl Er Nausea And Vomiting   Codeine Nausea And Vomiting   Latex Rash    Current Medications: Current Outpatient Medications  Medication Sig Dispense Refill   FLUoxetine (PROZAC) 20 MG tablet Take 30 mg by mouth every morning.     traMADol (ULTRAM) 50 MG tablet SMARTSIG:1 Tablet(s) By Mouth Every 12 Hours PRN     No current facility-administered medications for this visit.     ASSESSMENT & PLAN:   Assessment:  [name] [name] is a 65 y.o. female with PET imaging revealing highly hypermetabolic retroperitoneal lymphadenopathy. We discussed the results in detail and the need for tissue biopsy. She is agreeable to further diagnostic testing.  Plan: We will obtain authorization and schedule a lymph node biopsy with Interventional Radiology and have patient return to clinic once results have been obtained for consult with Dr. Bobby Rumpf.   The patient understands the plans discussed today and is in agreement with them.  She knows to contact our office if she develops concerns prior to her next appointment.     Melodye Ped, NP

## 2020-11-06 ENCOUNTER — Encounter: Payer: Self-pay | Admitting: Hematology and Oncology

## 2020-11-13 ENCOUNTER — Encounter: Payer: Self-pay | Admitting: Hematology and Oncology

## 2020-11-15 NOTE — Progress Notes (Signed)
[name] [name]  [address] [place],  [place]  [address] [phone]  Clinic Day:  11/15/2020  Referring physician: Algis Greenhouse, MD   HISTORY OF PRESENT ILLNESS:  The patient is a 65 y.o. female who I was asked to consult upon for a low grade serous papillary tumor.  Her history dates back 2 years ago when she began having back which radiated down her left leg. She received 2 nerve blocks in her lower back to improve this problem.  Over the past few months, her back pain became severe to where she received an MRI.  This study unexpectedly showed bulky retroperitoneal lymphadenopathy.  This area of disease was further confirmed per a PET scan, which showed this area to be hypermetabolic, with an SUV of 46.9.  The patient underwent a needle core biopsy of the lymphadenopathy in question last week, whose pathology was consistent with a low grade papillary serous neoplasm.  She comes in today to go over her pathology and its implications.  She believes she has unintentionally lost 15 pounds since the beginning of this calendar year.  Her bowel movements have been infrequent, but when she has them, she can have 10 loose stools daily.   PAST MEDICAL HISTORY:   Past Medical History:  Diagnosis Date   Arthritis    Chronic pain    Contact lens/glasses fitting    wears contacts or glasses   Depression    Fibromyalgia    PONV (postoperative nausea and vomiting)   Melanoma  PAST SURGICAL HISTORY:   Past Surgical History:  Procedure Laterality Date   BACK SURGERY     lumb lam   BREAST ENHANCEMENT SURGERY  1982   implants    BREAST IMPLANT EXCHANGE  2011   CARPAL TUNNEL RELEASE  2003   rt and lt   CARPOMETACARPEL SUSPENSION PLASTY  06/22/2012   Procedure: CARPOMETACARPEL ([place]) SUSPENSION PLASTY;  Surgeon: Alta Corning, MD;  Location: [facility];  Service: Orthopedics;  Laterality: Left;  burton's interposition arthroplasty left thumb     DIAGNOSTIC LAPAROSCOPY  2002   expl lap   DILATION AND CURETTAGE OF UTERUS  2003   ablation   ELBOW SURGERY  2001   decompression rt   EPIDURAL BLOCK INJECTION     multiple   TONSILLECTOMY    2 melanoma resections  CURRENT MEDICATIONS:   Current Outpatient Medications  Medication Sig Dispense Refill   FLUoxetine (PROZAC) 20 MG tablet Take 30 mg by mouth every morning.     traMADol (ULTRAM) 50 MG tablet SMARTSIG:1 Tablet(s) By Mouth Every 12 Hours PRN     No current facility-administered medications for this visit.    ALLERGIES:   Allergies  Allergen Reactions   Ciprofloxacin-Ciproflox Hcl Er Nausea And Vomiting   Codeine Nausea And Vomiting   Latex Rash    FAMILY HISTORY:   Family History  Problem Relation Age of Onset   Lung cancer Father   Her son died from heart disease/severe bradycardia.    SOCIAL HISTORY:  The patient was born and raised in [place].  She lives in [place] with her husband of 22 years.  She had 1 son; she has 1 grandchild.  She formerly worked for Primary school teacher.  She previously had an art business.  There is no history of alcohol or tobacco abuse.    REVIEW OF SYSTEMS:  Review of Systems  Constitutional:  Positive for unexpected weight change. Negative for fatigue  and fever.  HENT:   Negative for hearing loss and sore throat.   Eyes:  Negative for eye problems.  Respiratory:  Negative for chest tightness, cough and hemoptysis.   Cardiovascular:  Negative for chest pain and palpitations.  Gastrointestinal:  Positive for diarrhea and vomiting. Negative for abdominal distention, abdominal pain, blood in stool, constipation and nausea.  Endocrine: Negative for hot flashes.  Genitourinary:  Negative for difficulty urinating, dysuria, frequency, hematuria and nocturia.   Musculoskeletal:  Positive for arthralgias. Negative for back pain, gait problem and myalgias.  Skin: Negative.  Negative for itching and rash.  Neurological:   Positive for dizziness. Negative for extremity weakness, gait problem, headaches, light-headedness and numbness.  Hematological: Negative.   Psychiatric/Behavioral:  Positive for depression. Negative for suicidal ideas. The patient is not nervous/anxious.     PHYSICAL EXAM:  There were no vitals taken for this visit. Wt Readings from Last 3 Encounters:  11/02/20 141 lb (64 kg)  10/08/20 148 lb 4.8 oz (67.3 kg)  08/16/16 159 lb (72.1 kg)   There is no height or weight on file to calculate BMI. Performance status (ECOG): 1 - Symptomatic but completely ambulatory Physical Exam Constitutional:      Appearance: Normal appearance. She is not ill-appearing.  HENT:     Mouth/Throat:     Mouth: Mucous membranes are moist.     Pharynx: Oropharynx is clear. No oropharyngeal exudate or posterior oropharyngeal erythema.  Cardiovascular:     Rate and Rhythm: Normal rate and regular rhythm.     Heart sounds: No murmur heard.   No friction rub. No gallop.  Pulmonary:     Effort: Pulmonary effort is normal. No respiratory distress.     Breath sounds: Normal breath sounds. No wheezing, rhonchi or rales.  Chest:  Breasts:    Right: No axillary adenopathy or supraclavicular adenopathy.     Left: No axillary adenopathy or supraclavicular adenopathy.  Abdominal:     General: Bowel sounds are normal. There is no distension.     Palpations: Abdomen is soft. There is no mass.     Tenderness: There is no abdominal tenderness.  Musculoskeletal:        General: No swelling.     Right lower leg: No edema.     Left lower leg: No edema.  Lymphadenopathy:     Cervical: No cervical adenopathy.     Upper Body:     Right upper body: No supraclavicular or axillary adenopathy.     Left upper body: No supraclavicular or axillary adenopathy.     Lower Body: No right inguinal adenopathy. No left inguinal adenopathy.  Skin:    General: Skin is warm.     Coloration: Skin is not jaundiced.     Findings: No  lesion or rash.  Neurological:     General: No focal deficit present.     Mental Status: She is alert and oriented to person, place, and time. Mental status is at baseline.     Cranial Nerves: Cranial nerves are intact.  Psychiatric:        Mood and Affect: Mood normal.        Behavior: Behavior normal.        Thought Content: Thought content normal.   STUDIES:  NM PET Image Restag (PS) Skull Base To Thigh  Result Date: 10/30/2020 CLINICAL DATA:  Initial treatment strategy for retroperitoneal adenopathy. EXAM: NUCLEAR MEDICINE PET SKULL BASE TO THIGH TECHNIQUE: 7.0 mCi F-18 FDG was injected intravenously.  Full-ring PET imaging was performed from the skull base to thigh after the radiotracer. CT data was obtained and used for attenuation correction and anatomic localization. Fasting blood glucose: 99 mg/dl COMPARISON:  Multiple exams, including CT abdomen 10/02/2020 FINDINGS: Mediastinal blood pool activity: SUV max 2.5 Liver activity: SUV max 3.5 NECK: No significant abnormal hypermetabolic activity in this region. Incidental CT findings: Chronic left maxillary sinusitis. CHEST: 0.7 cm right axillary lymph node has maximum SUV of 3.5, Deauville 3. Incidental CT findings: Bilateral breast implants with dense capsular calcifications. Left anterior descending coronary artery atherosclerosis. Mitral valve calcifications noted. ABDOMEN/PELVIS: Prominently calcified retroperitoneal adenopathy is highly hypermetabolic, maximum SUV 63.0 (Deauville 5). This is primarily periaortic and extends down to the bifurcation. The spleen measures 10.9 by 6.7 by 10.2 cm (volume = 390 cm^3), upper normal size. No focal abnormal splenic activity. Incidental CT findings: Aortoiliac atherosclerotic vascular disease. Normal appendix. There are few scattered sigmoid colon diverticula. SKELETON: No significant abnormal hypermetabolic activity in this region. Incidental CT findings: Anterior plate and screw fixator at C6-7. Grade 1  degenerative anterolisthesis at L4-5 with posterior decompression at the L4-5 level. IMPRESSION: 1. The densely calcified retroperitoneal adenopathy is highly hypermetabolic with maximum SUV of 23.4 (Deauville 5) there is also Deauville 3 activity in a small right axillary lymph node. Given the lack of an obvious primary outside of the retroperitoneum, appearance tends to favor lymphoma or Castleman's disease. Tissue diagnosis suggested. 2. Other imaging findings of potential clinical significance: Chronic left maxillary sinusitis. Aortic Atherosclerosis (ICD10-I70.0). Coronary atherosclerosis. Electronically Signed   By: Van Clines M.D.   On: 10/30/2020 16:45     ASSESSMENT & PLAN:  A 65 y.o. female who I was asked to consult upon for a low grade serous papillary tumor.  In clinic today, I went over all of scans and pathology with her.  Although her retroperitoneum appears to be the only site of disease, the potential for her lymphadenopathy to be completely resected appears low.  She understands chemotherapy is likely in her future.  I will have her see gynecology-oncology later this week for their input in determining what can be done for her disease management.  Although cure is unlikely, hopefully systemic therapy can keep her disease under control for an extended period of time.  I will tentatively see her back in 3 weeks for repeat clinical assessment.  Although despondent, the patient and her husband understand all the plans discussed today and are in agreement with them.  I do appreciate Dough, Jaymes Graff, MD for his new consult.   Ophia Shamoon Macarthur Critchley, MD

## 2020-11-16 ENCOUNTER — Inpatient Hospital Stay (INDEPENDENT_AMBULATORY_CARE_PROVIDER_SITE_OTHER): Payer: Medicare Other | Admitting: Oncology

## 2020-11-16 ENCOUNTER — Encounter: Payer: Self-pay | Admitting: Hematology and Oncology

## 2020-11-16 ENCOUNTER — Other Ambulatory Visit: Payer: Self-pay

## 2020-11-16 ENCOUNTER — Other Ambulatory Visit: Payer: Self-pay | Admitting: Oncology

## 2020-11-16 ENCOUNTER — Telehealth: Payer: Self-pay | Admitting: Oncology

## 2020-11-16 ENCOUNTER — Inpatient Hospital Stay: Payer: Medicare Other

## 2020-11-16 DIAGNOSIS — Z79899 Other long term (current) drug therapy: Secondary | ICD-10-CM | POA: Diagnosis not present

## 2020-11-16 DIAGNOSIS — Z801 Family history of malignant neoplasm of trachea, bronchus and lung: Secondary | ICD-10-CM | POA: Diagnosis not present

## 2020-11-16 DIAGNOSIS — R194 Change in bowel habit: Secondary | ICD-10-CM | POA: Diagnosis not present

## 2020-11-16 DIAGNOSIS — C579 Malignant neoplasm of female genital organ, unspecified: Secondary | ICD-10-CM | POA: Diagnosis present

## 2020-11-16 DIAGNOSIS — C775 Secondary and unspecified malignant neoplasm of intrapelvic lymph nodes: Secondary | ICD-10-CM | POA: Diagnosis present

## 2020-11-16 DIAGNOSIS — M549 Dorsalgia, unspecified: Secondary | ICD-10-CM | POA: Diagnosis not present

## 2020-11-16 DIAGNOSIS — R634 Abnormal weight loss: Secondary | ICD-10-CM | POA: Diagnosis not present

## 2020-11-16 DIAGNOSIS — R590 Localized enlarged lymph nodes: Secondary | ICD-10-CM

## 2020-11-16 DIAGNOSIS — C569 Malignant neoplasm of unspecified ovary: Secondary | ICD-10-CM

## 2020-11-16 DIAGNOSIS — Z9882 Breast implant status: Secondary | ICD-10-CM | POA: Diagnosis not present

## 2020-11-16 DIAGNOSIS — F32A Depression, unspecified: Secondary | ICD-10-CM | POA: Diagnosis not present

## 2020-11-16 DIAGNOSIS — R140 Abdominal distension (gaseous): Secondary | ICD-10-CM | POA: Diagnosis not present

## 2020-11-16 DIAGNOSIS — M797 Fibromyalgia: Secondary | ICD-10-CM | POA: Diagnosis not present

## 2020-11-16 DIAGNOSIS — G8929 Other chronic pain: Secondary | ICD-10-CM | POA: Diagnosis not present

## 2020-11-16 NOTE — Telephone Encounter (Signed)
Per 6/20 los next appt scheduled and given to patient 

## 2020-11-17 LAB — CA 125: Cancer Antigen (CA) 125: 96.4 U/mL — ABNORMAL HIGH (ref 0.0–38.1)

## 2020-11-18 ENCOUNTER — Encounter: Payer: Self-pay | Admitting: Oncology

## 2020-11-18 ENCOUNTER — Other Ambulatory Visit: Payer: Self-pay

## 2020-11-18 ENCOUNTER — Ambulatory Visit
Admission: RE | Admit: 2020-11-18 | Discharge: 2020-11-18 | Disposition: A | Discharge status: Home / Self Care | Payer: Self-pay | Source: Ambulatory Visit | Attending: Gynecologic Oncology | Admitting: Gynecologic Oncology

## 2020-11-18 ENCOUNTER — Telehealth: Payer: Self-pay | Admitting: *Deleted

## 2020-11-18 ENCOUNTER — Encounter: Payer: Self-pay | Admitting: Gynecologic Oncology

## 2020-11-18 DIAGNOSIS — R1030 Lower abdominal pain, unspecified: Secondary | ICD-10-CM

## 2020-11-18 NOTE — Telephone Encounter (Signed)
Spoke with the patient and scheduled a new patient appt for 6/24 at 10:30 am with Dr Berline Lopes. Patient given an arrival time of 10 am. Patient given the address and phone number for the clinic. Patient also given the policy for mask and visitors. 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

## 2020-11-19 NOTE — Progress Notes (Unsigned)
GYNECOLOGIC ONCOLOGY NEW PATIENT CONSULTATION   Patient Name: [name] [name]  Patient Age: 65 y.o. Date of Service: 11/20/20 Referring Provider: Dr. Lavera Guise  Primary Care Provider: Algis Greenhouse, MD Consulting Provider: Jeral Pinch, MD   Assessment/Plan:    A copy of this note was sent to the patient's referring provider.   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 y.o. female who is seen in consultation at the request of Dr. Bobby Rumpf for an evaluation of ***  6/14: CT guided biopsy of retroperitoneal adenopathy 6/20: CA-125 96.4  ***  history of back pain  who is referred in consultation with Teressa Lower for assessment and management. Yanelis's history includes years of low back pain from a fall in 2020 for which injections have not been working. She underwent MRI that showed incidental findings of retroperitoneal lymphadenopathy. A CT abdominal/ pelvis was then ordered which revealed large calcified retroperitoneal lymph nodes highly concerning for metastatic disease, likely related to primary carcinoma of the colon or lymphoma. Clinical correlation is recommended. No bowel obstruction. Normal appendix. Stable right hepatic dome hemangioma.   Her medical history consists of varicose veins, IBS, osteopenia, fibromyositis, ADD, depression, chronic low back pain, prediabetes, right breast mass (benign), melanoma x 2, and constipation. Her surgical history is significant for catatacts, cervical fusion, hand surgery, lumbar surgery, ankle surgery, breast implants, carpal tunnel surgery, D&C, hysteroscopy with ablation, T&A, and wisdom tooth extraction. Her  family history is significant for lung cancer with her father who was a heavy smoker. 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 She is up to date on her pap and colonoscopy. She is due mammogram in July and is past due for dexascan.   Today she complains of fatigue, left sided back pain that radiates to her left flank, abdomen, groin and down her left leg. She has increased constipation as well as bloating and weight loss. She denies fever, chills, nausea or vomiting. She denies shortness of breath, chest pain or cough. She states she had a right breast cyst that was found to be benign.   PAST MEDICAL HISTORY:  Past Medical History:  Diagnosis Date   Arthritis    Chronic pain    Contact lens/glasses fitting    wears contacts or glasses   Depression    Fibromyalgia    PONV (postoperative nausea and vomiting)      PAST SURGICAL HISTORY:  Past Surgical History:  Procedure Laterality Date   BACK SURGERY     lumb lam   BREAST ENHANCEMENT SURGERY  1982   implants    BREAST IMPLANT EXCHANGE  2011   CARPAL TUNNEL RELEASE  2003   rt and lt   CARPOMETACARPEL SUSPENSION PLASTY  06/22/2012   Procedure: CARPOMETACARPEL ([place]) SUSPENSION PLASTY;  Surgeon: Alta Corning, MD;  Location: [place];  Service: Orthopedics;  Laterality: Left;  burton's interposition arthroplasty left thumb    DIAGNOSTIC LAPAROSCOPY  2002   expl lap   DILATION AND CURETTAGE OF UTERUS  2003   ablation   ELBOW SURGERY  2001   decompression rt   EPIDURAL BLOCK INJECTION     multiple  TONSILLECTOMY      OB/GYN HISTORY:  OB History  No obstetric history on file.    No LMP recorded. Patient is postmenopausal.  Age at menarche: ***  Age at menopause: *** Hx of HRT: *** Hx of STDs: *** Last pap: *** History of abnormal pap smears: ***  SCREENING STUDIES:  Last mammogram: 2021  Last colonoscopy: 2019 Last bone mineral density:  2019  MEDICATIONS: Outpatient Encounter Medications as of 11/20/2020  Medication Sig   CRANBERRY PO Take by mouth daily.   FLUoxetine (PROZAC) 20 MG tablet Take 10 mg by mouth every morning.   Omega-3 Fatty Acids (FISH OIL CONCENTRATE PO) Take by mouth.   OVER THE COUNTER MEDICATION Take by mouth daily. Happy Burnard Bunting Vitamin   ARIPiprazole (ABILIFY) 5 MG tablet Take 5 mg by mouth daily. (Patient not taking: Reported on 11/18/2020)   buprenorphine (BUTRANS) 10 MCG/HR PTWK Place 1 patch onto the skin once a week. (Patient not taking: Reported on 11/18/2020)   cromolyn (OPTICROM) 4 % ophthalmic solution SMARTSIG:In Eye(s) (Patient not taking: Reported on 11/18/2020)   diazepam (VALIUM) 5 MG tablet Take 5 mg by mouth as needed. (Patient not taking: Reported on 11/18/2020)   fluticasone (FLONASE) 50 MCG/ACT nasal spray Place into the nose. (Patient not taking: Reported on 11/18/2020)   Ibuprofen-Famotidine 800-26.6 MG TABS Take by mouth. (Patient not taking: Reported on 11/18/2020)   PARoxetine (PAXIL) 10 MG tablet Take by mouth. (Patient not taking: Reported on 11/18/2020)   sulfamethoxazole-trimethoprim (BACTRIM DS) 800-160 MG tablet Take 1 tablet by mouth 2 (two) times daily. (Patient not taking: Reported on 11/18/2020)   tolterodine (DETROL LA) 4 MG 24 hr capsule Take 4 mg by mouth daily. (Patient not taking: Reported on 11/18/2020)   traMADol (ULTRAM) 50 MG tablet SMARTSIG:1 Tablet(s) By Mouth Every 12 Hours PRN (Patient not taking: Reported on 11/18/2020)   No facility-administered encounter medications on file as of 11/20/2020.    ALLERGIES:  Allergies  Allergen Reactions   Ciprofloxacin-Ciproflox Hcl Er Nausea And Vomiting   Amoxicillin Nausea And Vomiting   Bupropion     Other reaction(s): GI Upset (intolerance)   Codeine Nausea And Vomiting   Duloxetine     Other reaction(s): Other (See Comments) High blood pressure response   Methylphenidate     Other reaction(s): Other (See  Comments) BLISTERS   Latex Rash     FAMILY HISTORY:  Family History  Problem Relation Age of Onset   Lung cancer Father    Colon cancer Neg Hx    Breast cancer Neg Hx    Ovarian cancer Neg Hx    Endometrial cancer Neg Hx    Pancreatic cancer Neg Hx    Prostate cancer Neg Hx      SOCIAL HISTORY:  Social Connections: Not on file    REVIEW OF SYSTEMS:  Denies appetite changes, fevers, chills, fatigue, unexplained weight changes. Denies hearing loss, neck lumps or masses, mouth sores, ringing in ears or voice changes. Denies cough or wheezing.  Denies shortness of breath. Denies chest pain or palpitations. Denies leg swelling. Denies abdominal distention, pain, blood in stools, constipation, diarrhea, nausea, vomiting, or early satiety. Denies pain with intercourse, dysuria, frequency, hematuria or incontinence. Denies hot flashes, pelvic pain, vaginal bleeding or vaginal discharge.   Denies joint pain, back pain or muscle pain/cramps. Denies itching, rash, or wounds. Denies dizziness, headaches, numbness or seizures. Denies swollen lymph nodes or glands, denies easy bruising or bleeding. Denies anxiety, depression, confusion, or  decreased concentration.  Physical Exam:  Vital Signs for this encounter:  There were no vitals taken for this visit. There is no height or weight on file to calculate BMI. General: Alert, oriented, no acute distress.  HEENT: Normocephalic, atraumatic. Sclera anicteric.  Chest: Clear to auscultation bilaterally. No wheezes, rhonchi, or rales. Cardiovascular: Regular rate and rhythm, no murmurs, rubs, or gallops.  Abdomen: ***Obese. Normoactive bowel sounds. Soft, nondistended, nontender to palpation. No masses or hepatosplenomegaly appreciated. No palpable fluid wave.  Extremities: Grossly normal range of motion. Warm, well perfused. No edema bilaterally.  Skin: No rashes or lesions.  Lymphatics: No cervical, supraclavicular, or inguinal adenopathy.   GU:  Normal external female genitalia. ***  No lesions. No discharge or bleeding.             Bladder/urethra:  No lesions or masses, well supported bladder             Vagina: ***             Cervix: Normal appearing, no lesions.             Uterus: *** Small, mobile, no parametrial involvement or nodularity.             Adnexa: *** masses.  Rectal: ***  LABORATORY AND RADIOLOGIC DATA:  Outside medical records were reviewed to synthesize the above history, along with the history and physical obtained during the visit.   Lab Results  Component Value Date   WBC 12.4 (H) 11/23/2007   HGB 11.6 (L) 06/22/2012   HCT 42.0 11/23/2007   PLT 224 11/23/2007   GLUCOSE 109 (H) 11/23/2007   NA 136 11/23/2007   K 3.3 (L) 11/23/2007   CL 103 11/23/2007   CREATININE 1.1 11/23/2007   BUN 11 11/23/2007   MRI lumbar spine 5/3/222: IMPRESSION: The dominant finding in this case is that of massive retroperitoneal lymphadenopathy, likely related to either metastatic disease or lymphoma. Chronic degenerative and postoperative findings of the spine as outlined in detail above.  CT A/P on 10/05/20: IMPRESSION: 1. Large calcified retroperitoneal lymph nodes highly concerning for metastatic disease, likely related to primary carcinoma of the colon or lymphoma. Clinical correlation is recommended. 2. No bowel obstruction. Normal appendix. 3. Stable right hepatic dome hemangioma.  PET 10/30/20: IMPRESSION: 1. The densely calcified retroperitoneal adenopathy is highly hypermetabolic with maximum SUV of 23.4 (Deauville 5) there is also Deauville 3 activity in a small right axillary lymph node. Given the lack of an obvious primary outside of the retroperitoneum, appearance tends to favor lymphoma or Castleman's disease. Tissue diagnosis suggested. 2. Other imaging findings of potential clinical significance: Chronic left maxillary sinusitis. Aortic Atherosclerosis (ICD10-I70.0). Coronary  atherosclerosis.

## 2020-11-20 ENCOUNTER — Telehealth: Payer: Self-pay | Admitting: *Deleted

## 2020-11-20 ENCOUNTER — Ambulatory Visit: Payer: Medicare Other | Admitting: Gynecologic Oncology

## 2020-11-20 NOTE — Telephone Encounter (Signed)
Spoke with the patient and rescheduled today's appt

## 2020-11-22 NOTE — Progress Notes (Signed)
GYNECOLOGIC ONCOLOGY NEW PATIENT CONSULTATION   Patient Name: [name] [name]  Patient Age: 65 y.o. Date of Service: 11/24/20 Referring Provider: Dr. 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  I reviewed in detail imaging findings with the patient and her husband. Her FDG avid disease is all retroperitoneal adenopathy. Her gyn organ are relatively unremarkable on CT, MRI and PET. We discussed biopsy results which favor a low-grade serous carcinoma of gyn origin. This is a somewhat atypical presentation, but my suspicion is that it is primary peritoneal carcinoma.  I reviewed treatment options in the setting of advanced stage ovarian cancer. We discussed neoadjuvant chemotherapy followed by interval surgery versus upfront surgery followed by adjuvant chemotherapy. Given biopsy results and the fact that low-grade serous tumors are typically indolent, slow-growing, and relatively chemoresistant, I favor upfront debulking. Given her burden of disease, it may not be possible to achieve complete cytoreduction, but I think she will benefit from debulking given histology. Additional tumor will also help assure that we have the correct pathologic diagnosis.   In terms of the surgery, I have her tentatively scheduled on 7/5 to include robotic hysterectomy, BSO, omentectomy, lymph node debulking, other indicated procedures, possible mini-lap, possible laparotomy. Despite normal appearing gyn organs, I recommend total hysterectomy and BSO. This will help in the pathologic assessment but also provide comprehensive staging. We discussed plan for lymph node assessment with goal of debulking as much of the retroperitoneal adenopathy as is feasible/safe. I will plan to start robotically but discussed the  limitations that might require a mini-lap or laparotomy given our goal of no residual disease if at all possible.   The risks of surgery were discussed in detail and she understands these to include infection; wound separation; hernia; vaginal cuff separation, injury to adjacent organs such as bowel, bladder, blood vessels, ureters and nerves; bleeding which may require blood transfusion; anesthesia risk; thromboembolic events; possible death; unforeseen complications; possible need for re-exploration; medical complications such as heart attack, stroke, pleural effusion and pneumonia; and, if full lymphadenectomy is performed the risk of lymphedema and lymphocyst. The patient will receive DVT and antibiotic prophylaxis as indicated. She voiced a clear understanding. She had the opportunity to ask questions. Perioperative instructions were reviewed with her.   In terms of adjuvant treatment, we discussed that traditional chemotherapy is still considered standard of care. I reviewed the treatment arms in [place] GY019 which is comparing taxol/carboplatin/maintenence letrozole to letrozole in stage II-IV LGS ovarian/primary peritoneal carcinoma. I will check with my colleague regarding whether enrollment is still ongoing at [facility] If, for some reason, it is not, I will reach out to see if the trial is open at [facility]. My recommendation would be, if it is feasible for the patient to participate, that she enroll in this clinical trial in the adjuvant setting. 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   A copy of this note was sent to the patient's referring provider.   65 minutes of total time was spent for this patient encounter, including preparation, face-to-face counseling with the patient and coordination of  care, and documentation of the encounter.  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 y.o. female who is seen in consultation at the request of Dr. Bobby Rumpf for an evaluation of retroperitoneal adenopathy with biopsy revealing LGS carcinoma likely of gyn origin.   The patient reports a history of chronic back pain and has had 2 prior back surgeries.  For at least a year, she remembers having back pain although not being able to tell if she also had abdominal pain.  Recently, her back surgeon ordered an MRI to look at her spine and this unfortunately saw significant retroperitoneal adenopathy.  This was followed by a CT and PET showing FDG avidity of her retroperitoneal adenopathy. CA-125 was mildly elevated at 96. CT-guided biopsy of adenopathy was c/w low grade serous carcinoma.  The patient endorses decreased appetite.  She has lost about 15 pounds in several months.  If she eats too much, she may get pain, nausea, or early satiety.  She describes at least a year and a half history although increasingly so in the last 6 months of significant change in her bowel function.  While her stools are not hard, she will sometimes go up to 4 days between having bowel movements and when she does have them will often have very little stool or multiple episodes of loose stools in a day.  She denies any urinary symptoms.  She endorses significant abdominal bloating.  Given the pain related to clothing, she cannot wear any clothing around her mid abdomen and has had to buy larger sizes.  She denies any lower extremity edema.  Lost her son about 2 years ago and her father shortly thereafter.  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  She comes in with her husband Hilliard Clark today.  PAST MEDICAL HISTORY:  Past Medical History:  Diagnosis Date   Arthritis    Chronic pain    Contact lens/glasses fitting    wears contacts or glasses   Depression    Fibromyalgia    Melanoma ([place])    PONV (postoperative nausea and vomiting)      PAST SURGICAL HISTORY:  Past Surgical History:  Procedure Laterality Date   BACK SURGERY     lumb lam   BREAST ENHANCEMENT SURGERY  05/30/1980   implants    BREAST IMPLANT EXCHANGE  05/30/2009   CARPAL TUNNEL RELEASE  05/30/2001   rt and lt   CARPOMETACARPEL SUSPENSION PLASTY  06/22/2012   Procedure: CARPOMETACARPEL ([place]) SUSPENSION PLASTY;  Surgeon: Alta Corning, MD;  Location: [place];  Service: Orthopedics;  Laterality: Left;  burton's interposition arthroplasty left thumb    DIAGNOSTIC LAPAROSCOPY  05/30/2000   expl lap   DILATION AND CURETTAGE OF UTERUS  05/30/2001   ablation   ELBOW SURGERY  05/31/1999   decompression rt   EPIDURAL BLOCK INJECTION     multiple   SKIN CANCER EXCISION     melanoma, from abdomen   TONSILLECTOMY      OB/GYN HISTORY:  OB History  Gravida Para Term Preterm AB Living  1 1          SAB IAB Ectopic Multiple Live Births               #  Outcome Date GA Lbr Len/2nd Weight Sex Delivery Anes PTL Lv  1 Para             No LMP recorded. Patient is postmenopausal.  Age at menarche: 18 Age at menopause: 76, denies any postmenopausal bleeding or discharge.  Patient does have pain with intercourse since menopause Hx of HRT: Denies Hx of STDs: Denies Last pap: 2 years ago History of abnormal pap smears: Denies  SCREENING STUDIES:  Last mammogram: 2021  Last colonoscopy: 2019 Last bone mineral density: 2019  MEDICATIONS: Outpatient Encounter Medications as of 11/24/2020  Medication Sig   CRANBERRY PO Take by mouth daily.   FLUoxetine (PROZAC) 20 MG  tablet Take 10 mg by mouth every morning.   Omega-3 Fatty Acids (FISH OIL CONCENTRATE PO) Take by mouth.   OVER THE COUNTER MEDICATION Take by mouth daily. Happy Burnard Bunting Vitamin   PARoxetine (PAXIL) 10 MG tablet Take by mouth.   ARIPiprazole (ABILIFY) 5 MG tablet Take 5 mg by mouth daily. (Patient not taking: No sig reported)   buprenorphine (BUTRANS) 10 MCG/HR PTWK Place 1 patch onto the skin once a week. (Patient not taking: No sig reported)   cromolyn (OPTICROM) 4 % ophthalmic solution SMARTSIG:In Eye(s) (Patient not taking: No sig reported)   diazepam (VALIUM) 5 MG tablet Take 5 mg by mouth as needed. (Patient not taking: No sig reported)   fluticasone (FLONASE) 50 MCG/ACT nasal spray Place into the nose. (Patient not taking: No sig reported)   Ibuprofen-Famotidine 800-26.6 MG TABS Take by mouth. (Patient not taking: No sig reported)   sulfamethoxazole-trimethoprim (BACTRIM DS) 800-160 MG tablet Take 1 tablet by mouth 2 (two) times daily. (Patient not taking: No sig reported)   tolterodine (DETROL LA) 4 MG 24 hr capsule Take 4 mg by mouth daily. (Patient not taking: Reported on 11/23/2020)   traMADol (ULTRAM) 50 MG tablet SMARTSIG:1 Tablet(s) By Mouth Every 12 Hours PRN (Patient not taking: Reported on 11/23/2020)   No facility-administered encounter medications on file as of 11/24/2020.    ALLERGIES:  Allergies  Allergen Reactions   Ciprofloxacin-Ciproflox Hcl Er Nausea And Vomiting   Amoxicillin Nausea And Vomiting   Bupropion     Other reaction(s): GI Upset (intolerance)   Codeine Nausea And Vomiting   Duloxetine     Other reaction(s): Other (See Comments) High blood pressure response   Methylphenidate     Other reaction(s): Other (See Comments) BLISTERS   Latex Rash     FAMILY HISTORY:  Family History  Problem Relation Age of Onset   Lung cancer Father    Colon cancer Neg Hx    Breast cancer Neg Hx    Ovarian cancer Neg Hx    Endometrial cancer Neg Hx    Pancreatic  cancer Neg Hx    Prostate cancer Neg Hx      SOCIAL HISTORY:  Social Connections: Not on file    REVIEW OF SYSTEMS:  Pertinent positives include decreased appetite, abdominal pain, dyspareunia, anxiety. Denies fevers, chills, fatigue. Denies hearing loss, neck lumps or masses, mouth sores, ringing in ears or voice changes. Denies cough or wheezing.  Denies shortness of breath. Denies chest pain or palpitations. Denies leg swelling. Denies blood in stools, diarrhea, nausea, vomiting. Denies pain with intercourse, dysuria, frequency, hematuria or incontinence. Denies hot flashes, pelvic pain, vaginal bleeding or vaginal discharge.   Denies joint pain, back pain or muscle pain/cramps. Denies itching, rash, or wounds. Denies dizziness, headaches, numbness or seizures. Denies swollen lymph  nodes or glands, denies easy bruising or bleeding. Denies depression, confusion, or decreased concentration.  Physical Exam:  Vital Signs for this encounter:  Blood pressure 139/86, pulse 78, temperature (!) 97.5 F (36.4 C), temperature source Tympanic, resp. rate 18, height $RemoveBe'5\' 3"'IFsXMfwrS$  (1.6 m), weight 139 lb 3.2 oz (63.1 kg), SpO2 100 %. Body mass index is 24.66 kg/m. General: Alert, oriented, no acute distress.  HEENT: Normocephalic, atraumatic. Sclera anicteric.  Chest: Clear to auscultation bilaterally. No wheezes, rhonchi, or rales. Cardiovascular: Regular rate and rhythm, no murmurs, rubs, or gallops.  Abdomen: Normoactive bowel sounds. Soft, nondistended, nontender to palpation. No masses or hepatosplenomegaly appreciated. No palpable fluid wave.  Extremities: Grossly normal range of motion. Warm, well perfused. No edema bilaterally.  Skin: No rashes or lesions.  Lymphatics: No cervical, supraclavicular, or inguinal adenopathy.  GU:  Normal external female genitalia. No lesions. No discharge or bleeding.             Bladder/urethra:  No lesions or masses, well supported bladder              Vagina: Moderately atrophic.  No lesions or masses.             Cervix: Normal appearing, no lesions.             Uterus: Small, mobile, no parametrial involvement or nodularity.             Adnexa: No masses appreciated.  Rectal: No nodularity.  LABORATORY AND RADIOLOGIC DATA:  Outside medical records were reviewed to synthesize the above history, along with the history and physical obtained during the visit.   Lab Results  Component Value Date   WBC 12.4 (H) 11/23/2007   HGB 11.6 (L) 06/22/2012   HCT 42.0 11/23/2007   PLT 224 11/23/2007   GLUCOSE 109 (H) 11/23/2007   NA 136 11/23/2007   K 3.3 (L) 11/23/2007   CL 103 11/23/2007   CREATININE 1.1 11/23/2007   BUN 11 11/23/2007   MRI lumbar spine 5/3/222: IMPRESSION: The dominant finding in this case is that of massive retroperitoneal lymphadenopathy, likely related to either metastatic disease or lymphoma. Chronic degenerative and postoperative findings of the spine as outlined in detail above.   CT A/P on 10/05/20: IMPRESSION: 1. Large calcified retroperitoneal lymph nodes highly concerning for metastatic disease, likely related to primary carcinoma of the colon or lymphoma. Clinical correlation is recommended. 2. No bowel obstruction. Normal appendix. 3. Stable right hepatic dome hemangioma.   PET 10/30/20: IMPRESSION: 1. The densely calcified retroperitoneal adenopathy is highly hypermetabolic with maximum SUV of 23.4 (Deauville 5) there is also Deauville 3 activity in a small right axillary lymph node. Given the lack of an obvious primary outside of the retroperitoneum, appearance tends to favor lymphoma or Castleman's disease. Tissue diagnosis suggested. 2. Other imaging findings of potential clinical significance: Chronic left maxillary sinusitis. Aortic Atherosclerosis (ICD10-I70.0). Coronary atherosclerosis.  Pathology 6/14: CT guided biopsy of retroperitoneal adenopathy.  Biopsy showed papillary serous neoplasm  with psammoma bodies, IHC typical of a GYN primary and morphology as well as Ki-67 weak staining are compatible with low-grade serous neoplasm.  6/20: CA-125 96.4

## 2020-11-22 NOTE — H&P (View-Only) (Signed)
GYNECOLOGIC ONCOLOGY NEW PATIENT CONSULTATION   Patient Name: [name] [name]  Patient Age: 65 y.o. Date of Service: 11/24/20 Referring Provider: Dr. 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  I reviewed in detail imaging findings with the patient and her husband. Her FDG avid disease is all retroperitoneal adenopathy. Her gyn organ are relatively unremarkable on CT, MRI and PET. We discussed biopsy results which favor a low-grade serous carcinoma of gyn origin. This is a somewhat atypical presentation, but my suspicion is that it is primary peritoneal carcinoma.  I reviewed treatment options in the setting of advanced stage ovarian cancer. We discussed neoadjuvant chemotherapy followed by interval surgery versus upfront surgery followed by adjuvant chemotherapy. Given biopsy results and the fact that low-grade serous tumors are typically indolent, slow-growing, and relatively chemoresistant, I favor upfront debulking. Given her burden of disease, it may not be possible to achieve complete cytoreduction, but I think she will benefit from debulking given histology. Additional tumor will also help assure that we have the correct pathologic diagnosis.   In terms of the surgery, I have her tentatively scheduled on 7/5 to include robotic hysterectomy, BSO, omentectomy, lymph node debulking, other indicated procedures, possible mini-lap, possible laparotomy. Despite normal appearing gyn organs, I recommend total hysterectomy and BSO. This will help in the pathologic assessment but also provide comprehensive staging. We discussed plan for lymph node assessment with goal of debulking as much of the retroperitoneal adenopathy as is feasible/safe. I will plan to start robotically but discussed the  limitations that might require a mini-lap or laparotomy given our goal of no residual disease if at all possible.   The risks of surgery were discussed in detail and she understands these to include infection; wound separation; hernia; vaginal cuff separation, injury to adjacent organs such as bowel, bladder, blood vessels, ureters and nerves; bleeding which may require blood transfusion; anesthesia risk; thromboembolic events; possible death; unforeseen complications; possible need for re-exploration; medical complications such as heart attack, stroke, pleural effusion and pneumonia; and, if full lymphadenectomy is performed the risk of lymphedema and lymphocyst. The patient will receive DVT and antibiotic prophylaxis as indicated. She voiced a clear understanding. She had the opportunity to ask questions. Perioperative instructions were reviewed with her.   In terms of adjuvant treatment, we discussed that traditional chemotherapy is still considered standard of care. I reviewed the treatment arms in [place] [id number] which is comparing taxol/carboplatin/maintenence letrozole to letrozole in stage II-IV LGS ovarian/primary peritoneal carcinoma. I will check with my colleague regarding whether enrollment is still ongoing at [facility]. If, for some reason, it is not, I will reach out to see if the trial is open at [facility]. My recommendation would be, if it is feasible for the patient to participate, that she enroll in this clinical trial in the adjuvant setting. 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   A copy of this note was sent to the patient's referring provider.   65 minutes of total time was spent for this patient encounter, including preparation, face-to-face counseling with the patient and coordination of  care, and documentation of the encounter.  Belenda Cruise  Berline Lopes, MD  Division of Gynecologic Oncology  Department of Obstetrics and Gynecology  [facility]  ___________________________________________  Chief Complaint: Chief Complaint  Patient presents with   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, unspecif    History of Present Illness:  [name] [name] is a 65 y.o. y.o. female who is seen in consultation at the request of Dr. Bobby Rumpf for an evaluation of retroperitoneal adenopathy with biopsy revealing LGS carcinoma likely of gyn origin.   The patient reports a history of chronic back pain and has had 2 prior back surgeries.  For at least a year, she remembers having back pain although not being able to tell if she also had abdominal pain.  Recently, her back surgeon ordered an MRI to look at her spine and this unfortunately saw significant retroperitoneal adenopathy.  This was followed by a CT and PET showing FDG avidity of her retroperitoneal adenopathy. CA-125 was mildly elevated at 96. CT-guided biopsy of adenopathy was c/w low grade serous carcinoma.  The patient endorses decreased appetite.  She has lost about 15 pounds in several months.  If she eats too much, she may get pain, nausea, or early satiety.  She describes at least a year and a half history although increasingly so in the last 6 months of significant change in her bowel function.  While her stools are not hard, she will sometimes go up to 4 days between having bowel movements and when she does have them will often have very little stool or multiple episodes of loose stools in a day.  She denies any urinary symptoms.  She endorses significant abdominal bloating.  Given the pain related to clothing, she cannot wear any clothing around her mid abdomen and has had to buy larger sizes.  She denies any lower extremity edema.  Lost her son about 2 years ago and her father shortly thereafter.  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  She comes in with her husband Hilliard Clark today.  PAST MEDICAL HISTORY:  Past Medical History:  Diagnosis Date   Arthritis    Chronic pain    Contact lens/glasses fitting    wears contacts or glasses   Depression    Fibromyalgia    Melanoma ([place])    PONV (postoperative nausea and vomiting)      PAST SURGICAL HISTORY:  Past Surgical History:  Procedure Laterality Date   BACK SURGERY     lumb lam   BREAST ENHANCEMENT SURGERY  05/30/1980   implants    BREAST IMPLANT EXCHANGE  05/30/2009   CARPAL TUNNEL RELEASE  05/30/2001   rt and lt   CARPOMETACARPEL SUSPENSION PLASTY  06/22/2012   Procedure: CARPOMETACARPEL ([place]) SUSPENSION PLASTY;  Surgeon: Alta Corning, MD;  Location: [facility];  Service: Orthopedics;  Laterality: Left;  burton's interposition arthroplasty left thumb    DIAGNOSTIC LAPAROSCOPY  05/30/2000   expl lap   DILATION AND CURETTAGE OF UTERUS  05/30/2001   ablation   ELBOW SURGERY  05/31/1999   decompression rt   EPIDURAL BLOCK INJECTION     multiple   SKIN CANCER EXCISION     melanoma, from abdomen   TONSILLECTOMY      OB/GYN HISTORY:  OB History  Gravida Para Term Preterm AB Living  1 1          SAB IAB Ectopic Multiple Live Births               #  Outcome Date GA Lbr Len/2nd Weight Sex Delivery Anes PTL Lv  1 Para             No LMP recorded. Patient is postmenopausal.  Age at menarche: 65 Age at menopause: 65, denies any postmenopausal bleeding or discharge.  Patient does have pain with intercourse since menopause Hx of HRT: Denies Hx of STDs: Denies Last pap: 2 years ago History of abnormal pap smears: Denies  SCREENING STUDIES:  Last mammogram: 2021  Last colonoscopy: 2019 Last bone mineral density: 2019  MEDICATIONS: Outpatient Encounter Medications as of 11/24/2020  Medication Sig   CRANBERRY PO Take by mouth daily.   FLUoxetine (PROZAC) 20 MG  tablet Take 10 mg by mouth every morning.   Omega-3 Fatty Acids (FISH OIL CONCENTRATE PO) Take by mouth.   OVER THE COUNTER MEDICATION Take by mouth daily. Happy Burnard Bunting Vitamin   PARoxetine (PAXIL) 10 MG tablet Take by mouth.   ARIPiprazole (ABILIFY) 5 MG tablet Take 5 mg by mouth daily. (Patient not taking: No sig reported)   buprenorphine (BUTRANS) 10 MCG/HR PTWK Place 1 patch onto the skin once a week. (Patient not taking: No sig reported)   cromolyn (OPTICROM) 4 % ophthalmic solution SMARTSIG:In Eye(s) (Patient not taking: No sig reported)   diazepam (VALIUM) 5 MG tablet Take 5 mg by mouth as needed. (Patient not taking: No sig reported)   fluticasone (FLONASE) 50 MCG/ACT nasal spray Place into the nose. (Patient not taking: No sig reported)   Ibuprofen-Famotidine 800-26.6 MG TABS Take by mouth. (Patient not taking: No sig reported)   sulfamethoxazole-trimethoprim (BACTRIM DS) 800-160 MG tablet Take 1 tablet by mouth 2 (two) times daily. (Patient not taking: No sig reported)   tolterodine (DETROL LA) 4 MG 24 hr capsule Take 4 mg by mouth daily. (Patient not taking: Reported on 11/23/2020)   traMADol (ULTRAM) 50 MG tablet SMARTSIG:1 Tablet(s) By Mouth Every 12 Hours PRN (Patient not taking: Reported on 11/23/2020)   No facility-administered encounter medications on file as of 11/24/2020.    ALLERGIES:  Allergies  Allergen Reactions   Ciprofloxacin-Ciproflox Hcl Er Nausea And Vomiting   Amoxicillin Nausea And Vomiting   Bupropion     Other reaction(s): GI Upset (intolerance)   Codeine Nausea And Vomiting   Duloxetine     Other reaction(s): Other (See Comments) High blood pressure response   Methylphenidate     Other reaction(s): Other (See Comments) BLISTERS   Latex Rash     FAMILY HISTORY:  Family History  Problem Relation Age of Onset   Lung cancer Father    Colon cancer Neg Hx    Breast cancer Neg Hx    Ovarian cancer Neg Hx    Endometrial cancer Neg Hx    Pancreatic  cancer Neg Hx    Prostate cancer Neg Hx      SOCIAL HISTORY:  Social Connections: Not on file    REVIEW OF SYSTEMS:  Pertinent positives include decreased appetite, abdominal pain, dyspareunia, anxiety. Denies fevers, chills, fatigue. Denies hearing loss, neck lumps or masses, mouth sores, ringing in ears or voice changes. Denies cough or wheezing.  Denies shortness of breath. Denies chest pain or palpitations. Denies leg swelling. Denies blood in stools, diarrhea, nausea, vomiting. Denies pain with intercourse, dysuria, frequency, hematuria or incontinence. Denies hot flashes, pelvic pain, vaginal bleeding or vaginal discharge.   Denies joint pain, back pain or muscle pain/cramps. Denies itching, rash, or wounds. Denies dizziness, headaches, numbness or seizures. Denies swollen lymph  nodes or glands, denies easy bruising or bleeding. Denies depression, confusion, or decreased concentration.  Physical Exam:  Vital Signs for this encounter:  Blood pressure 139/86, pulse 78, temperature (!) 97.5 F (36.4 C), temperature source Tympanic, resp. rate 18, height $RemoveBe'5\' 3"'[id number]$  (1.6 m), weight 139 lb 3.2 oz (63.1 kg), SpO2 100 %. Body mass index is 24.66 kg/m. General: Alert, oriented, no acute distress.  HEENT: Normocephalic, atraumatic. Sclera anicteric.  Chest: Clear to auscultation bilaterally. No wheezes, rhonchi, or rales. Cardiovascular: Regular rate and rhythm, no murmurs, rubs, or gallops.  Abdomen: Normoactive bowel sounds. Soft, nondistended, nontender to palpation. No masses or hepatosplenomegaly appreciated. No palpable fluid wave.  Extremities: Grossly normal range of motion. Warm, well perfused. No edema bilaterally.  Skin: No rashes or lesions.  Lymphatics: No cervical, supraclavicular, or inguinal adenopathy.  GU:  Normal external female genitalia. No lesions. No discharge or bleeding.             Bladder/urethra:  No lesions or masses, well supported bladder              Vagina: Moderately atrophic.  No lesions or masses.             Cervix: Normal appearing, no lesions.             Uterus: Small, mobile, no parametrial involvement or nodularity.             Adnexa: No masses appreciated.  Rectal: No nodularity.  LABORATORY AND RADIOLOGIC DATA:  Outside medical records were reviewed to synthesize the above history, along with the history and physical obtained during the visit.   Lab Results  Component Value Date   WBC 12.4 (H) 11/23/2007   HGB 11.6 (L) 06/22/2012   HCT 42.0 11/23/2007   PLT 224 11/23/2007   GLUCOSE 109 (H) 11/23/2007   NA 136 11/23/2007   K 3.3 (L) 11/23/2007   CL 103 11/23/2007   CREATININE 1.1 11/23/2007   BUN 11 11/23/2007   MRI lumbar spine 5/3/222: IMPRESSION: The dominant finding in this case is that of massive retroperitoneal lymphadenopathy, likely related to either metastatic disease or lymphoma. Chronic degenerative and postoperative findings of the spine as outlined in detail above.   CT A/P on 10/05/20: IMPRESSION: 1. Large calcified retroperitoneal lymph nodes highly concerning for metastatic disease, likely related to primary carcinoma of the colon or lymphoma. Clinical correlation is recommended. 2. No bowel obstruction. Normal appendix. 3. Stable right hepatic dome hemangioma.   PET 10/30/20: IMPRESSION: 1. The densely calcified retroperitoneal adenopathy is highly hypermetabolic with maximum SUV of 23.4 (Deauville 5) there is also Deauville 3 activity in a small right axillary lymph node. Given the lack of an obvious primary outside of the retroperitoneum, appearance tends to favor lymphoma or Castleman's disease. Tissue diagnosis suggested. 2. Other imaging findings of potential clinical significance: Chronic left maxillary sinusitis. Aortic Atherosclerosis (ICD10-I70.0). Coronary atherosclerosis.  Pathology 6/14: CT guided biopsy of retroperitoneal adenopathy.  Biopsy showed papillary serous neoplasm  with psammoma bodies, IHC typical of a GYN primary and morphology as well as Ki-67 weak staining are compatible with low-grade serous neoplasm.  6/20: CA-125 96.4

## 2020-11-23 ENCOUNTER — Encounter: Payer: Self-pay | Admitting: Gynecologic Oncology

## 2020-11-24 ENCOUNTER — Inpatient Hospital Stay: Payer: Medicare Other | Admitting: Gynecologic Oncology

## 2020-11-24 ENCOUNTER — Other Ambulatory Visit: Payer: Self-pay

## 2020-11-24 ENCOUNTER — Encounter: Payer: Self-pay | Admitting: Gynecologic Oncology

## 2020-11-24 VITALS — BP 139/86 | HR 78 | Temp 97.5°F | Resp 18 | Ht 63.0 in | Wt 139.2 lb

## 2020-11-24 DIAGNOSIS — C579 Malignant neoplasm of female genital organ, unspecified: Secondary | ICD-10-CM | POA: Diagnosis not present

## 2020-11-24 DIAGNOSIS — C775 Secondary and unspecified malignant neoplasm of intrapelvic lymph nodes: Secondary | ICD-10-CM | POA: Diagnosis not present

## 2020-11-24 DIAGNOSIS — R599 Enlarged lymph nodes, unspecified: Secondary | ICD-10-CM | POA: Insufficient documentation

## 2020-11-24 DIAGNOSIS — C569 Malignant neoplasm of unspecified ovary: Secondary | ICD-10-CM

## 2020-11-24 NOTE — Patient Instructions (Signed)
Preparing for your Surgery  Plan for surgery on December 01, 2020 with Dr. Jeral Pinch at [place] will be scheduled for a robotic assisted laparoscopic total hysterectomy (removal of the uterus and cervix), bilateral salpingo-oophorectomy (removal of both ovaries and fallopian tubes), omentectomy (removal of the omentum which is a fatty curtain that hangs off the transverse colon), lymph node debulking, possible laparotomy (larger incision on your abdomen if needed).   Pre-operative Testing -You will receive a phone call from presurgical testing at [facility] to arrange for a pre-operative appointment and lab work.  -Bring your insurance card, copy of an advanced directive if applicable, medication list  -At that visit, you will be asked to sign a consent for a possible blood transfusion in case a transfusion becomes necessary during surgery.  The need for a blood transfusion is rare but having consent is a necessary part of your care.     -You should not be taking blood thinners or aspirin at least ten days prior to surgery unless instructed by your surgeon.  -Do not take supplements such as fish oil (omega 3), red yeast rice, turmeric before your surgery. You want to avoid medications with aspirin in them including headache powders such as BC or Goody's), Excedrin migraine.  Day Before Surgery at [place] will be asked to take in a light diet the day before surgery. You will be advised you can have clear liquids up until 3 hours before your surgery.    Eat a light diet the day before surgery.  Examples including soups, broths, toast, yogurt, mashed potatoes.  AVOID GAS PRODUCING FOODS. Things to avoid include carbonated beverages (fizzy beverages, sodas), raw fruits and raw vegetables (uncooked), or beans.   If your bowels are filled with gas, your surgeon will have difficulty visualizing your pelvic organs which increases your surgical risks.  Your role in  recovery Your role is to become active as soon as directed by your doctor, while still giving yourself time to heal.  Rest when you feel tired. You will be asked to do the following in order to speed your recovery:  - Cough and breathe deeply. This helps to clear and expand your lungs and can prevent pneumonia after surgery.  - [place]. Do mild physical activity. Walking or moving your legs help your circulation and body functions return to normal. Do not try to get up or walk alone the first time after surgery.   -If you develop swelling on one leg or the other, pain in the back of your leg, redness/warmth in one of your legs, please call the office or go to the Emergency Room to have a doppler to rule out a blood clot. For shortness of breath, chest pain-seek care in the Emergency Room as soon as possible. - Actively manage your pain. Managing your pain lets you move in comfort. We will ask you to rate your pain on a scale of zero to 10. It is your responsibility to tell your doctor or nurse where and how much you hurt so your pain can be treated.  Special Considerations -If you are diabetic, you may be placed on insulin after surgery to have closer control over your blood sugars to promote healing and recovery.  This does not mean that you will be discharged on insulin.  If applicable, your oral antidiabetics will be resumed when you are tolerating a solid diet.  -Your final pathology results from surgery  should be available around one week after surgery and the results will be relayed to you when available.  -Dr. Lahoma Crocker is the surgeon that assists your GYN Oncologist with surgery.  If you end up staying the night, the next day after your surgery you will either see Dr. Denman George, Dr. Berline Lopes, or Dr. Lahoma Crocker.  -FMLA forms can be faxed to [phone] and please allow 5-7 business days for completion.  Pain Management After Surgery -You will be prescribed  your pain medication and bowel regimen medications before surgery so that you can have these available when you are discharged from the hospital. The pain medication is for use ONLY AFTER surgery and a new prescription will not be given.   -Make sure that you have Tylenol and Ibuprofen at home to use on a regular basis after surgery for pain control. We recommend alternating the medications every hour to six hours since they work differently and are processed in the body differently for pain relief.  -Review the attached handout on narcotic use and their risks and side effects.   Bowel Regimen -You will be prescribed Sennakot-S to take nightly to prevent constipation especially if you are taking the narcotic pain medication intermittently.  It is important to prevent constipation and drink adequate amounts of liquids. You can stop taking this medication when you are not taking pain medication and you are back on your normal bowel routine.  Risks of Surgery Risks of surgery are low but include bleeding, infection, damage to surrounding structures, re-operation, blood clots, and very rarely death.   Blood Transfusion Information (For the consent to be signed before surgery)  We will be checking your blood type before surgery so in case of emergencies, we will know what type of blood you would need.                                            WHAT IS A BLOOD TRANSFUSION?  A transfusion is the replacement of blood or some of its parts. Blood is made up of multiple cells which provide different functions. Red blood cells carry oxygen and are used for blood loss replacement. White blood cells fight against infection. Platelets control bleeding. Plasma helps clot blood. Other blood products are available for specialized needs, such as hemophilia or other clotting disorders. BEFORE THE TRANSFUSION  Who gives blood for transfusions?  You may be able to donate blood to be used at a later date on  yourself (autologous donation). Relatives can be asked to donate blood. This is generally not any safer than if you have received blood from a stranger. The same precautions are taken to ensure safety when a relative's blood is donated. Healthy volunteers who are fully evaluated to make sure their blood is safe. This is blood bank blood. Transfusion therapy is the safest it has ever been in the practice of medicine. Before blood is taken from a donor, a complete history is taken to make sure that person has no history of diseases nor engages in risky social behavior (examples are intravenous drug use or sexual activity with multiple partners). The donor's travel history is screened to minimize risk of transmitting infections, such as malaria. The donated blood is tested for signs of infectious diseases, such as HIV and hepatitis. The blood is then tested to be sure it is compatible with you in order  to minimize the chance of a transfusion reaction. If you or a relative donates blood, this is often done in anticipation of surgery and is not appropriate for emergency situations. It takes many days to process the donated blood. RISKS AND COMPLICATIONS Although transfusion therapy is very safe and saves many lives, the main dangers of transfusion include:  Getting an infectious disease. Developing a transfusion reaction. This is an allergic reaction to something in the blood you were given. Every precaution is taken to prevent this. The decision to have a blood transfusion has been considered carefully by your caregiver before blood is given. Blood is not given unless the benefits outweigh the risks.  AFTER SURGERY INSTRUCTIONS  Return to work: 4-6 weeks if applicable  Activity: 1. Be up and out of the bed during the day.  Take a nap if needed.  You may walk up steps but be careful and use the hand rail.  Stair climbing will tire you more than you think, you may need to stop part way and rest.   2. No  lifting or straining for 6 weeks over 10 pounds. No pushing, pulling, straining for 6 weeks.  3. No driving for around 1 week(s).  Do not drive if you are taking narcotic pain medicine and make sure that your reaction time has returned.   4. You can shower as soon as the next day after surgery. Shower daily.  Use your regular soap and water (not directly on the incision) and pat your incision(s) dry afterwards; don't rub.  No tub baths or submerging your body in water until cleared by your surgeon. If you have the soap that was given to you by pre-surgical testing that was used before surgery, you do not need to use it afterwards because this can irritate your incisions.   5. No sexual activity and nothing in the vagina for 8 weeks.  6. You may experience a small amount of clear drainage from your incisions, which is normal.  If the drainage persists, increases, or changes color please call the office.  7. Do not use creams, lotions, or ointments such as neosporin on your incisions after surgery until advised by your surgeon because they can cause removal of the dermabond glue on your incisions.    8. You may experience vaginal spotting after surgery or around the 6-8 week mark from surgery when the stitches at the top of the vagina begin to dissolve.  The spotting is normal but if you experience heavy bleeding, call our office.  9. Take Tylenol or ibuprofen first for pain and only use narcotic pain medication for severe pain not relieved by the Tylenol or Ibuprofen.  Monitor your Tylenol intake to a max of 4,000 mg in a 24 hour period. You can alternate these medications after surgery.  Diet: 1. Low sodium Heart Healthy Diet is recommended but you are cleared to resume your normal (before surgery) diet after your procedure.  2. It is safe to use a laxative, such as Miralax or Colace, if you have difficulty moving your bowels. You have been prescribed Sennakot at bedtime every evening to keep  bowel movements regular and to prevent constipation.    Wound Care: 1. Keep clean and dry.  Shower daily.  Reasons to call the Doctor: Fever - Oral temperature greater than 100.4 degrees Fahrenheit Foul-smelling vaginal discharge Difficulty urinating Nausea and vomiting Increased pain at the site of the incision that is unrelieved with pain medicine. Difficulty breathing with or  without chest pain New calf pain especially if only on one side Sudden, continuing increased vaginal bleeding with or without clots.   Contacts: For questions or concerns you should contact:  Dr. Jeral Pinch at [phone]  Joylene John, NP at [phone]  After Hours: call [phone] and have the GYN Oncologist paged/contacted (after 5 pm or on the weekends).  Messages sent via mychart are for non-urgent matters and are not responded to after hours so for urgent needs, please call the after hours number.

## 2020-11-25 ENCOUNTER — Telehealth: Payer: Self-pay | Admitting: Oncology

## 2020-11-25 ENCOUNTER — Other Ambulatory Visit: Payer: Self-pay | Admitting: Gynecologic Oncology

## 2020-11-25 ENCOUNTER — Encounter (HOSPITAL_COMMUNITY): Payer: Self-pay | Admitting: Gynecologic Oncology

## 2020-11-25 DIAGNOSIS — C569 Malignant neoplasm of unspecified ovary: Secondary | ICD-10-CM

## 2020-11-25 NOTE — Telephone Encounter (Signed)
Patient requested to [facility] 7/12 Follow Up w/Dr Bobby Rumpf.  She may have treatment in [place] since she lives there.  She will call back if she chooses to have treatment here

## 2020-11-25 NOTE — Progress Notes (Signed)
COVID Vaccine Completed: Yes Date COVID Vaccine completed: 09/12/19 Has received booster: x1 COVID vaccine manufacturer: Moderna  Date of COVID positive in last 90 days: No  PCP - Algis Greenhouse, MD Cardiologist - N/A  Chest x-ray - N/A EKG - N/A Stress Test - N/A ECHO - N/A Cardiac Cath - N/A Pacemaker/ICD device last checked:N/A  Sleep Study - N/A CPAP - N/A  Fasting Blood Sugar - N/A Checks Blood Sugar ___N/A__ times a day  Blood Thinner Instructions: N/A Aspirin Instructions: N/A Last Dose: N/A  Activity level:  Can go up a flight of stairs and activities of daily living without stopping and without symptoms      Anesthesia review: N/A  Patient denies shortness of breath, fever, cough and chest pain at PAT appointment   Patient verbalized understanding of instructions that were given to them at the PAT appointment. Patient was also instructed that they will need to review over the PAT instructions again at home before surgery.

## 2020-11-26 ENCOUNTER — Other Ambulatory Visit: Payer: Self-pay

## 2020-11-26 ENCOUNTER — Encounter (HOSPITAL_COMMUNITY): Payer: Self-pay | Admitting: Gynecologic Oncology

## 2020-11-26 ENCOUNTER — Other Ambulatory Visit: Payer: Self-pay | Admitting: Gynecologic Oncology

## 2020-11-26 DIAGNOSIS — C569 Malignant neoplasm of unspecified ovary: Secondary | ICD-10-CM

## 2020-11-26 MED ORDER — TRAMADOL HCL 50 MG PO TABS: 50.0000 mg | ORAL_TABLET | Freq: Four times a day (QID) | ORAL | 0 refills | Status: DC | PRN

## 2020-11-26 MED ORDER — SENNOSIDES-DOCUSATE SODIUM 8.6-50 MG PO TABS: 2.0000 | ORAL_TABLET | Freq: Every day | ORAL | 0 refills | Status: DC

## 2020-11-26 NOTE — Addendum Note (Signed)
Addended by: Lafonda Mosses on: 11/26/2020 11:04 AM   Modules accepted: Level of Service

## 2020-11-26 NOTE — Progress Notes (Signed)
Post-op meds prescribed pre-operatively prior to surgery on December 01, 2020.

## 2020-11-26 NOTE — Patient Instructions (Signed)
DUE TO COVID-19 ONLY ONE VISITOR IS ALLOWED TO COME WITH YOU AND STAY IN THE WAITING ROOM ONLY DURING PRE OP AND PROCEDURE.   **NO VISITORS ARE ALLOWED IN THE SHORT STAY AREA OR RECOVERY ROOM!!**       Your procedure is scheduled on: Tuesday, December 01, 2020   Report to [facility] Main  Entrance    Report to admitting at 8:00 AM   Call this number if you have problems the morning of surgery [phone]   Do not eat food :After Midnight.   May have liquids until 7:00 AM day of surgery  CLEAR LIQUID DIET  Foods Allowed                                                                     Foods Excluded  Water, Black Coffee and tea, regular and decaf                             liquids that you cannot  Plain Jell-O in any flavor  (No red)                                           see through such as: Fruit ices (not with fruit pulp)                                     milk, soups, orange juice              Iced Popsicles (No red)                                    All solid food                                   Apple juices Sports drinks like Gatorade (No red) Lightly seasoned clear broth or consume(fat free) Sugar, honey syrup  Sample Menu Breakfast                                Lunch                                     Supper Cranberry juice                    Beef broth                            Chicken broth Jell-O                                     Grape juice  Apple juice Coffee or tea                        Jell-O                                      Popsicle                                                Coffee or tea                        Coffee or tea     Oral Hygiene is also important to reduce your risk of infection.                                    Remember - BRUSH YOUR TEETH THE MORNING OF SURGERY WITH YOUR REGULAR TOOTHPASTE   Do NOT smoke after Midnight   Take these medicines the morning of surgery with A SIP OF WATER: None                               You may not have any metal on your body including hair pins, jewelry, and body piercing             Do not wear make-up, lotions, powders, perfumes/cologne, or deodorant  Do not wear nail polish including gel and S&S, artificial/acrylic nails, or any other type of covering on natural nails including fingernails. If you have artificial nails, gel coating, etc. that needs to be removed by a nail salon please have this removed prior to surgery or surgery may need to be canceled/ delayed if the surgeon/ anesthesia feels like they are unable to be safely monitored.   Do not shave  48 hours prior to surgery.    Do not bring valuables to the hospital. [place].   Contacts, dentures or bridgework may not be worn into surgery.    Patients discharged the day of surgery will not be allowed to drive home.   Special Instructions: Bring a copy of your healthcare power of attorney and living will documents         the day of surgery if you haven't scanned them in before.              Please read over the following fact sheets you were given: IF YOU HAVE QUESTIONS ABOUT YOUR PRE OP INSTRUCTIONS PLEASE CALL [phone]   [place] - Preparing for Surgery Before surgery, you can play an important role.  Because skin is not sterile, your skin needs to be as free of germs as possible.  You can reduce the number of germs on your skin by washing with CHG (chlorahexidine gluconate) soap before surgery.  CHG is an antiseptic cleaner which kills germs and bonds with the skin to continue killing germs even after washing. Please DO NOT use if you have an allergy to CHG or antibacterial soaps.  If your skin becomes reddened/irritated stop using  the CHG and inform your nurse when you arrive at Short Stay. Do not shave (including legs and underarms) for at least 48 hours prior to the first CHG shower.  You may shave your face/neck.  Please  follow these instructions carefully:  1.  Shower with CHG Soap the night before surgery and the  morning of surgery.  2.  If you choose to wash your hair, wash your hair first as usual with your normal  shampoo.  3.  After you shampoo, rinse your hair and body thoroughly to remove the shampoo.                             4.  Use CHG as you would any other liquid soap.  You can apply chg directly to the skin and wash.  Gently with a scrungie or clean washcloth.  5.  Apply the CHG Soap to your body ONLY FROM THE NECK DOWN.   Do   not use on face/ open                           Wound or open sores. Avoid contact with eyes, ears mouth and   genitals (private parts).                       Wash face,  Genitals (private parts) with your normal soap.             6.  Wash thoroughly, paying special attention to the area where your    surgery  will be performed.  7.  Thoroughly rinse your body with warm water from the neck down.  8.  DO NOT shower/wash with your normal soap after using and rinsing off the CHG Soap.                9.  Pat yourself dry with a clean towel.            10.  Wear clean pajamas.            11.  Place clean sheets on your bed the night of your first shower and do not  sleep with pets. Day of Surgery : Do not apply any lotions/deodorants the morning of surgery.  Please wear clean clothes to the hospital/surgery center.  FAILURE TO FOLLOW THESE INSTRUCTIONS MAY RESULT IN THE CANCELLATION OF YOUR SURGERY  PATIENT SIGNATURE_________________________________  NURSE SIGNATURE__________________________________  ________________________________________________________________________  WHAT IS A BLOOD TRANSFUSION? Blood Transfusion Information  A transfusion is the replacement of blood or some of its parts. Blood is made up of multiple cells which provide different functions. Red blood cells carry oxygen and are used for blood loss replacement. White blood cells fight against  infection. Platelets control bleeding. Plasma helps clot blood. Other blood products are available for specialized needs, such as hemophilia or other clotting disorders. BEFORE THE TRANSFUSION  Who gives blood for transfusions?  Healthy volunteers who are fully evaluated to make sure their blood is safe. This is blood bank blood. Transfusion therapy is the safest it has ever been in the practice of medicine. Before blood is taken from a donor, a complete history is taken to make sure that person has no history of diseases nor engages in risky social behavior (examples are intravenous drug use or sexual activity with multiple partners). The donor's travel history is screened to minimize risk of transmitting  infections, such as malaria. The donated blood is tested for signs of infectious diseases, such as HIV and hepatitis. The blood is then tested to be sure it is compatible with you in order to minimize the chance of a transfusion reaction. If you or a relative donates blood, this is often done in anticipation of surgery and is not appropriate for emergency situations. It takes many days to process the donated blood. RISKS AND COMPLICATIONS Although transfusion therapy is very safe and saves many lives, the main dangers of transfusion include:  Getting an infectious disease. Developing a transfusion reaction. This is an allergic reaction to something in the blood you were given. Every precaution is taken to prevent this. The decision to have a blood transfusion has been considered carefully by your caregiver before blood is given. Blood is not given unless the benefits outweigh the risks. AFTER THE TRANSFUSION Right after receiving a blood transfusion, you will usually feel much better and more energetic. This is especially true if your red blood cells have gotten low (anemic). The transfusion raises the level of the red blood cells which carry oxygen, and this usually causes an energy increase. The  nurse administering the transfusion will monitor you carefully for complications. HOME CARE INSTRUCTIONS  No special instructions are needed after a transfusion. You may find your energy is better. Speak with your caregiver about any limitations on activity for underlying diseases you may have. SEEK MEDICAL CARE IF:  Your condition is not improving after your transfusion. You develop redness or irritation at the intravenous (IV) site. SEEK IMMEDIATE MEDICAL CARE IF:  Any of the following symptoms occur over the next 12 hours: Shaking chills. You have a temperature by mouth above 102 F (38.9 C), not controlled by medicine. Chest, back, or muscle pain. People around you feel you are not acting correctly or are confused. Shortness of breath or difficulty breathing. Dizziness and fainting. You get a rash or develop hives. You have a decrease in urine output. Your urine turns a dark color or changes to pink, red, or brown. Any of the following symptoms occur over the next 10 days: You have a temperature by mouth above 102 F (38.9 C), not controlled by medicine. Shortness of breath. Weakness after normal activity. The white part of the eye turns yellow (jaundice). You have a decrease in the amount of urine or are urinating less often. Your urine turns a dark color or changes to pink, red, or brown. Document Released: 05/13/2000 Document Revised: 08/08/2011 Document Reviewed: 12/31/2007 [facility] Patient Information 2014 [place], [place].  _______________________________________________________________________

## 2020-11-27 ENCOUNTER — Telehealth: Payer: Self-pay

## 2020-11-27 ENCOUNTER — Encounter (HOSPITAL_COMMUNITY)
Admission: RE | Admit: 2020-11-27 | Discharge: 2020-11-27 | Disposition: A | Discharge status: Home / Self Care | Payer: Medicare Other | Source: Ambulatory Visit | Attending: Gynecologic Oncology | Admitting: Gynecologic Oncology

## 2020-11-27 DIAGNOSIS — Z01812 Encounter for preprocedural laboratory examination: Secondary | ICD-10-CM | POA: Insufficient documentation

## 2020-11-27 LAB — CBC
HCT: 40.9 % (ref 36.0–46.0)
Hemoglobin: 13.6 g/dL (ref 12.0–15.0)
MCH: 30.4 pg (ref 26.0–34.0)
MCHC: 33.3 g/dL (ref 30.0–36.0)
MCV: 91.5 fL (ref 80.0–100.0)
Platelets: 216 10*3/uL (ref 150–400)
RBC: 4.47 MIL/uL (ref 3.87–5.11)
RDW: 11.9 % (ref 11.5–15.5)
WBC: 6.4 10*3/uL (ref 4.0–10.5)
nRBC: 0 % (ref 0.0–0.2)

## 2020-11-27 LAB — URINALYSIS, ROUTINE W REFLEX MICROSCOPIC
Bilirubin Urine: NEGATIVE
Glucose, UA: NEGATIVE mg/dL
Hgb urine dipstick: NEGATIVE
Ketones, ur: NEGATIVE mg/dL
Nitrite: NEGATIVE
Protein, ur: NEGATIVE mg/dL
Specific Gravity, Urine: 1.018 (ref 1.005–1.030)
WBC, UA: >50 WBC/hpf — ABNORMAL HIGH (ref 0–5)
pH: 5 (ref 5.0–8.0)

## 2020-11-27 LAB — BASIC METABOLIC PANEL
Anion gap: 6 (ref 5–15)
BUN: 12 mg/dL (ref 8–23)
CO2: 28 mmol/L (ref 22–32)
Calcium: 9.6 mg/dL (ref 8.9–10.3)
Chloride: 105 mmol/L (ref 98–111)
Creatinine, Ser: 0.77 mg/dL (ref 0.44–1.00)
GFR, Estimated: >60 mL/min (ref >60)
Glucose, Bld: 102 mg/dL — ABNORMAL HIGH (ref 70–99)
Potassium: 3.9 mmol/L (ref 3.5–5.1)
Sodium: 139 mmol/L (ref 135–145)

## 2020-11-27 NOTE — Telephone Encounter (Signed)
Telephone call to check on pre-operative status.  Patient compliant with pre-operative instructions.  Reinforced NPO after midnight.  No questions or concerns voiced.  Instructed to call for any needs.   

## 2020-12-01 ENCOUNTER — Ambulatory Visit (HOSPITAL_COMMUNITY): Payer: Medicare Other | Admitting: Anesthesiology

## 2020-12-01 ENCOUNTER — Other Ambulatory Visit: Payer: Self-pay

## 2020-12-01 ENCOUNTER — Inpatient Hospital Stay (HOSPITAL_COMMUNITY)
Admission: RE | Admit: 2020-12-01 | Discharge: 2020-12-03 | DRG: 357 | Disposition: A | Discharge status: Home / Self Care | Payer: Medicare Other | Attending: Gynecologic Oncology | Admitting: Gynecologic Oncology

## 2020-12-01 ENCOUNTER — Encounter (HOSPITAL_COMMUNITY)
Admission: RE | Disposition: A | Discharge status: Home / Self Care | Payer: Self-pay | Source: Home / Self Care | Attending: Gynecologic Oncology

## 2020-12-01 ENCOUNTER — Encounter (HOSPITAL_COMMUNITY): Payer: Self-pay | Admitting: Gynecologic Oncology

## 2020-12-01 DIAGNOSIS — R599 Enlarged lymph nodes, unspecified: Secondary | ICD-10-CM

## 2020-12-01 DIAGNOSIS — F411 Generalized anxiety disorder: Secondary | ICD-10-CM

## 2020-12-01 DIAGNOSIS — R1900 Intra-abdominal and pelvic swelling, mass and lump, unspecified site: Secondary | ICD-10-CM | POA: Diagnosis present

## 2020-12-01 DIAGNOSIS — C569 Malignant neoplasm of unspecified ovary: Secondary | ICD-10-CM

## 2020-12-01 DIAGNOSIS — N736 Female pelvic peritoneal adhesions (postinfective): Secondary | ICD-10-CM | POA: Diagnosis present

## 2020-12-01 DIAGNOSIS — Z20822 Contact with and (suspected) exposure to covid-19: Secondary | ICD-10-CM | POA: Diagnosis present

## 2020-12-01 DIAGNOSIS — C482 Malignant neoplasm of peritoneum, unspecified: Secondary | ICD-10-CM

## 2020-12-01 DIAGNOSIS — F32A Depression, unspecified: Secondary | ICD-10-CM | POA: Diagnosis present

## 2020-12-01 DIAGNOSIS — Z801 Family history of malignant neoplasm of trachea, bronchus and lung: Secondary | ICD-10-CM

## 2020-12-01 DIAGNOSIS — I96 Gangrene, not elsewhere classified: Secondary | ICD-10-CM | POA: Diagnosis present

## 2020-12-01 DIAGNOSIS — R590 Localized enlarged lymph nodes: Secondary | ICD-10-CM | POA: Diagnosis present

## 2020-12-01 DIAGNOSIS — C481 Malignant neoplasm of specified parts of peritoneum: Principal | ICD-10-CM | POA: Diagnosis present

## 2020-12-01 DIAGNOSIS — M797 Fibromyalgia: Secondary | ICD-10-CM | POA: Diagnosis present

## 2020-12-01 DIAGNOSIS — Z79899 Other long term (current) drug therapy: Secondary | ICD-10-CM

## 2020-12-01 DIAGNOSIS — Z85820 Personal history of malignant melanoma of skin: Secondary | ICD-10-CM

## 2020-12-01 HISTORY — PX: LYMPH NODE DISSECTION: SHX5087

## 2020-12-01 HISTORY — DX: Malignant neoplasm of peritoneum, unspecified: C48.2

## 2020-12-01 LAB — TYPE AND SCREEN
ABO/RH(D): A POS
Antibody Screen: NEGATIVE

## 2020-12-01 LAB — ABO/RH: ABO/RH(D): A POS

## 2020-12-01 SURGERY — XI ROBOTIC ASSISTED TOTAL HYSTERECTOMY BILATERAL SALPINGO OOPHORECTOMY WITH OMENTECTOMY AND DEBULKING
Anesthesia: General

## 2020-12-01 MED ORDER — GABAPENTIN 100 MG PO CAPS
200.0000 mg | ORAL_CAPSULE | ORAL | Status: DC
  Filled 2020-12-01: qty 2

## 2020-12-01 MED ORDER — HYDROMORPHONE HCL 2 MG PO TABS: 2.0000 mg | ORAL_TABLET | ORAL | Status: DC | PRN

## 2020-12-01 MED ORDER — LACTATED RINGERS IV SOLN: INTRAVENOUS | Status: DC | PRN

## 2020-12-01 MED ORDER — ONDANSETRON HCL 4 MG/2ML IJ SOLN
4.0000 mg | Freq: Four times a day (QID) | INTRAMUSCULAR | Status: DC | PRN
  Administered 2020-12-01 (×2): 4 mg via INTRAVENOUS
  Filled 2020-12-01 (×2): qty 2

## 2020-12-01 MED ORDER — LACTATED RINGERS IR SOLN
Status: DC | PRN
  Administered 2020-12-01: 1000 mL

## 2020-12-01 MED ORDER — ACETAMINOPHEN 500 MG PO TABS
1000.0000 mg | ORAL_TABLET | Freq: Two times a day (BID) | ORAL | Status: DC
Start: 2020-12-02 — End: 2020-12-03

## 2020-12-01 MED ORDER — ROCURONIUM BROMIDE 10 MG/ML (PF) SYRINGE
PREFILLED_SYRINGE | INTRAVENOUS | Status: AC
  Filled 2020-12-01: qty 10

## 2020-12-01 MED ORDER — PROPOFOL 500 MG/50ML IV EMUL
INTRAVENOUS | Status: DC | PRN
  Administered 2020-12-01: 150 ug/kg/min via INTRAVENOUS

## 2020-12-01 MED ORDER — SENNOSIDES-DOCUSATE SODIUM 8.6-50 MG PO TABS
2.0000 | ORAL_TABLET | Freq: Every day | ORAL | Status: DC
  Filled 2020-12-01: qty 2

## 2020-12-01 MED ORDER — ENOXAPARIN (LOVENOX) PATIENT EDUCATION KIT
PACK | Freq: Once | Status: AC
  Filled 2020-12-01: qty 1

## 2020-12-01 MED ORDER — CHLORHEXIDINE GLUCONATE 0.12 % MT SOLN
15.0000 mL | Freq: Once | OROMUCOSAL | Status: AC
  Administered 2020-12-01: 15 mL via OROMUCOSAL

## 2020-12-01 MED ORDER — ENOXAPARIN SODIUM 40 MG/0.4ML IJ SOSY
40.0000 mg | PREFILLED_SYRINGE | INTRAMUSCULAR | Status: DC
  Administered 2020-12-02 – 2020-12-03 (×2): 40 mg via SUBCUTANEOUS
  Filled 2020-12-01 (×2): qty 0.4

## 2020-12-01 MED ORDER — PROPOFOL 10 MG/ML IV BOLUS
INTRAVENOUS | Status: AC
  Filled 2020-12-01: qty 20

## 2020-12-01 MED ORDER — IBUPROFEN 200 MG PO TABS: 600.0000 mg | ORAL_TABLET | Freq: Four times a day (QID) | ORAL | Status: DC

## 2020-12-01 MED ORDER — PREGABALIN 25 MG PO CAPS
25.0000 mg | ORAL_CAPSULE | Freq: Two times a day (BID) | ORAL | Status: DC
  Administered 2020-12-02 – 2020-12-03 (×2): 25 mg via ORAL
  Filled 2020-12-01 (×3): qty 1

## 2020-12-01 MED ORDER — BUPIVACAINE HCL 0.25 % IJ SOLN
INTRAMUSCULAR | Status: DC | PRN
  Administered 2020-12-01: 33 mL

## 2020-12-01 MED ORDER — KCL IN DEXTROSE-NACL 20-5-0.45 MEQ/L-%-% IV SOLN
INTRAVENOUS | Status: DC
  Filled 2020-12-01 (×4): qty 1000

## 2020-12-01 MED ORDER — SODIUM CHLORIDE (PF) 0.9 % IJ SOLN
INTRAMUSCULAR | Status: AC
  Filled 2020-12-01: qty 20

## 2020-12-01 MED ORDER — TRAMADOL HCL 50 MG PO TABS: 50.0000 mg | ORAL_TABLET | Freq: Four times a day (QID) | ORAL | Status: DC

## 2020-12-01 MED ORDER — HEPARIN SODIUM (PORCINE) 5000 UNIT/ML IJ SOLN
5000.0000 [IU] | INTRAMUSCULAR | Status: AC
  Administered 2020-12-01: 5000 [IU] via SUBCUTANEOUS
  Filled 2020-12-01: qty 1

## 2020-12-01 MED ORDER — ACETAMINOPHEN 10 MG/ML IV SOLN
INTRAVENOUS | Status: DC | PRN
  Administered 2020-12-01: 1000 mg via INTRAVENOUS

## 2020-12-01 MED ORDER — HYDROMORPHONE HCL 1 MG/ML IJ SOLN: 0.2500 mg | INTRAMUSCULAR | Status: DC | PRN

## 2020-12-01 MED ORDER — 0.9 % SODIUM CHLORIDE (POUR BTL) OPTIME
TOPICAL | Status: DC | PRN
  Administered 2020-12-01: 2000 mL

## 2020-12-01 MED ORDER — STERILE WATER FOR IRRIGATION IR SOLN
Status: DC | PRN
  Administered 2020-12-01: 1000 mL

## 2020-12-01 MED ORDER — LIDOCAINE 2% (20 MG/ML) 5 ML SYRINGE
INTRAMUSCULAR | Status: AC
  Filled 2020-12-01: qty 5

## 2020-12-01 MED ORDER — DEXAMETHASONE SODIUM PHOSPHATE 10 MG/ML IJ SOLN
INTRAMUSCULAR | Status: DC | PRN
  Administered 2020-12-01: 10 mg via INTRAVENOUS

## 2020-12-01 MED ORDER — CEFAZOLIN SODIUM-DEXTROSE 2-4 GM/100ML-% IV SOLN
2.0000 g | INTRAVENOUS | Status: AC
  Administered 2020-12-01: 2 g via INTRAVENOUS
  Filled 2020-12-01: qty 100

## 2020-12-01 MED ORDER — HYDROMORPHONE HCL 1 MG/ML IJ SOLN: 0.5000 mg | INTRAMUSCULAR | Status: DC | PRN

## 2020-12-01 MED ORDER — MIDAZOLAM HCL 2 MG/2ML IJ SOLN
INTRAMUSCULAR | Status: AC
  Filled 2020-12-01: qty 2

## 2020-12-01 MED ORDER — ONDANSETRON HCL 4 MG/2ML IJ SOLN
INTRAMUSCULAR | Status: DC | PRN
  Administered 2020-12-01 (×2): 4 mg via INTRAVENOUS

## 2020-12-01 MED ORDER — FENTANYL CITRATE (PF) 250 MCG/5ML IJ SOLN
INTRAMUSCULAR | Status: AC
  Filled 2020-12-01: qty 5

## 2020-12-01 MED ORDER — DEXAMETHASONE SODIUM PHOSPHATE 4 MG/ML IJ SOLN: 4.0000 mg | INTRAMUSCULAR | Status: DC

## 2020-12-01 MED ORDER — SCOPOLAMINE 1 MG/3DAYS TD PT72
1.0000 | MEDICATED_PATCH | TRANSDERMAL | Status: DC
  Administered 2020-12-01: 1.5 mg via TRANSDERMAL
  Filled 2020-12-01: qty 1

## 2020-12-01 MED ORDER — TRAMADOL HCL 50 MG PO TABS
50.0000 mg | ORAL_TABLET | Freq: Four times a day (QID) | ORAL | Status: DC | PRN
  Administered 2020-12-01 (×2): 50 mg via ORAL
  Filled 2020-12-01 (×2): qty 1

## 2020-12-01 MED ORDER — FENTANYL CITRATE (PF) 100 MCG/2ML IJ SOLN
INTRAMUSCULAR | Status: DC | PRN
  Administered 2020-12-01 (×4): 50 ug via INTRAVENOUS

## 2020-12-01 MED ORDER — ORAL CARE MOUTH RINSE: 15.0000 mL | Freq: Once | OROMUCOSAL | Status: AC

## 2020-12-01 MED ORDER — KETOROLAC TROMETHAMINE 30 MG/ML IJ SOLN
INTRAMUSCULAR | Status: DC | PRN
  Administered 2020-12-01: 30 mg via INTRAVENOUS

## 2020-12-01 MED ORDER — NON FORMULARY: 1.0000 [IU] | Freq: Three times a day (TID) | Status: DC

## 2020-12-01 MED ORDER — PROPOFOL 10 MG/ML IV BOLUS
INTRAVENOUS | Status: DC | PRN
  Administered 2020-12-01: 140 mg via INTRAVENOUS

## 2020-12-01 MED ORDER — ONDANSETRON HCL 4 MG/2ML IJ SOLN: 4.0000 mg | Freq: Once | INTRAMUSCULAR | Status: DC | PRN

## 2020-12-01 MED ORDER — SUGAMMADEX SODIUM 200 MG/2ML IV SOLN
INTRAVENOUS | Status: DC | PRN
  Administered 2020-12-01: 200 mg via INTRAVENOUS

## 2020-12-01 MED ORDER — PROMETHAZINE HCL 12.5 MG PO TABS: 12.5000 mg | ORAL_TABLET | Freq: Four times a day (QID) | ORAL | 0 refills | Status: DC | PRN

## 2020-12-01 MED ORDER — BUPIVACAINE HCL 0.25 % IJ SOLN
INTRAMUSCULAR | Status: AC
  Filled 2020-12-01: qty 1

## 2020-12-01 MED ORDER — LIDOCAINE 2% (20 MG/ML) 5 ML SYRINGE
INTRAMUSCULAR | Status: AC
  Filled 2020-12-01: qty 15

## 2020-12-01 MED ORDER — TRAMADOL HCL 50 MG PO TABS: 100.0000 mg | ORAL_TABLET | Freq: Two times a day (BID) | ORAL | Status: DC | PRN

## 2020-12-01 MED ORDER — CHEWING GUM (ORBIT) SUGAR FREE
1.0000 | CHEWING_GUM | Freq: Three times a day (TID) | ORAL | Status: DC
  Administered 2020-12-01 – 2020-12-03 (×4): 1 via ORAL
  Filled 2020-12-01: qty 1

## 2020-12-01 MED ORDER — FLUOXETINE HCL 10 MG PO CAPS
10.0000 mg | ORAL_CAPSULE | Freq: Every evening | ORAL | Status: DC
  Administered 2020-12-02: 10 mg via ORAL
  Filled 2020-12-01 (×3): qty 1

## 2020-12-01 MED ORDER — LIDOCAINE 2% (20 MG/ML) 5 ML SYRINGE
INTRAMUSCULAR | Status: DC | PRN
  Administered 2020-12-01: 100 mg via INTRAVENOUS

## 2020-12-01 MED ORDER — LACTATED RINGERS IV SOLN: INTRAVENOUS | Status: DC

## 2020-12-01 MED ORDER — KETOROLAC TROMETHAMINE 30 MG/ML IJ SOLN
INTRAMUSCULAR | Status: AC
  Filled 2020-12-01: qty 1

## 2020-12-01 MED ORDER — MIDAZOLAM HCL 5 MG/5ML IJ SOLN
INTRAMUSCULAR | Status: DC | PRN
  Administered 2020-12-01: 2 mg via INTRAVENOUS

## 2020-12-01 MED ORDER — SODIUM CHLORIDE (PF) 0.9 % IJ SOLN
INTRAMUSCULAR | Status: DC | PRN
  Administered 2020-12-01: 20 mL

## 2020-12-01 MED ORDER — ROCURONIUM BROMIDE 100 MG/10ML IV SOLN
INTRAVENOUS | Status: DC | PRN
  Administered 2020-12-01 (×3): 20 mg via INTRAVENOUS
  Administered 2020-12-01: 70 mg via INTRAVENOUS
  Administered 2020-12-01: 10 mg via INTRAVENOUS

## 2020-12-01 MED ORDER — DROPERIDOL 2.5 MG/ML IJ SOLN
INTRAMUSCULAR | Status: DC | PRN
  Administered 2020-12-01: .625 mg via INTRAVENOUS

## 2020-12-01 MED ORDER — ACETAMINOPHEN 500 MG PO TABS
1000.0000 mg | ORAL_TABLET | ORAL | Status: DC
Start: 2020-12-01 — End: 2020-12-01
  Filled 2020-12-01: qty 2

## 2020-12-01 MED ORDER — KETAMINE HCL 10 MG/ML IJ SOLN
INTRAMUSCULAR | Status: AC
  Filled 2020-12-01: qty 1

## 2020-12-01 MED ORDER — ACETAMINOPHEN 10 MG/ML IV SOLN
INTRAVENOUS | Status: AC
  Filled 2020-12-01: qty 100

## 2020-12-01 MED ORDER — DEXMEDETOMIDINE (PRECEDEX) IN NS 20 MCG/5ML (4 MCG/ML) IV SYRINGE
PREFILLED_SYRINGE | INTRAVENOUS | Status: AC
  Filled 2020-12-01: qty 5

## 2020-12-01 MED ORDER — LIDOCAINE HCL (PF) 2 % IJ SOLN
INTRAMUSCULAR | Status: DC | PRN
  Administered 2020-12-01: 1.5 mg/kg/h via INTRADERMAL

## 2020-12-01 MED ORDER — PROPOFOL 1000 MG/100ML IV EMUL
INTRAVENOUS | Status: AC
  Filled 2020-12-01: qty 100

## 2020-12-01 MED ORDER — DEXMEDETOMIDINE (PRECEDEX) IN NS 20 MCG/5ML (4 MCG/ML) IV SYRINGE
PREFILLED_SYRINGE | INTRAVENOUS | Status: DC | PRN
  Administered 2020-12-01 (×3): 4 ug via INTRAVENOUS

## 2020-12-01 MED ORDER — BUPIVACAINE LIPOSOME 1.3 % IJ SUSP
20.0000 mL | Freq: Once | INTRAMUSCULAR | Status: AC
  Administered 2020-12-01: 20 mL
  Filled 2020-12-01: qty 20

## 2020-12-01 MED ORDER — ONDANSETRON HCL 4 MG/2ML IJ SOLN
INTRAMUSCULAR | Status: AC
  Filled 2020-12-01: qty 2

## 2020-12-01 MED ORDER — ONDANSETRON HCL 4 MG PO TABS: 4.0000 mg | ORAL_TABLET | Freq: Four times a day (QID) | ORAL | Status: DC | PRN

## 2020-12-01 MED ORDER — KETAMINE HCL 10 MG/ML IJ SOLN
INTRAMUSCULAR | Status: DC | PRN
  Administered 2020-12-01 (×3): 10 mg via INTRAVENOUS

## 2020-12-01 SURGICAL SUPPLY — 84 items
ADH SKN CLS APL DERMABOND .7 (GAUZE/BANDAGES/DRESSINGS) ×1
AGENT HMST 10 BLLW SHRT CANN (HEMOSTASIS) ×1
AGENT HMST KT MTR STRL THRMB (HEMOSTASIS)
APL ESCP 34 STRL LF DISP (HEMOSTASIS)
APL LAPSCP (MISCELLANEOUS) ×1
APPLICATOR LAPAROSCOPIC HEMOBL (MISCELLANEOUS) ×2 IMPLANT
APPLICATOR SURGIFLO ENDO (HEMOSTASIS) IMPLANT
BACTOSHIELD CHG 4% 4OZ (MISCELLANEOUS) ×1
BAG COUNTER SPONGE SURGICOUNT (BAG) IMPLANT
BAG LAPAROSCOPIC 12 15 PORT 16 (BASKET) IMPLANT
BAG RETRIEVAL 12/15 (BASKET)
BAG SPEC RTRVL LRG 6X4 10 (ENDOMECHANICALS)
BAG SPNG CNTER NS LX DISP (BAG)
BLADE EXTENDED COATED 6.5IN (ELECTRODE) ×2 IMPLANT
BLADE SURG SZ10 CARB STEEL (BLADE) IMPLANT
COVER BACK TABLE 60X90IN (DRAPES) ×2 IMPLANT
COVER TIP SHEARS 8 DVNC (MISCELLANEOUS) ×1 IMPLANT
COVER TIP SHEARS 8MM DA VINCI (MISCELLANEOUS) ×2
DECANTER SPIKE VIAL GLASS SM (MISCELLANEOUS) IMPLANT
DERMABOND ADVANCED (GAUZE/BANDAGES/DRESSINGS) ×1
DERMABOND ADVANCED .7 DNX12 (GAUZE/BANDAGES/DRESSINGS) ×1 IMPLANT
DRAPE ARM DVNC X/XI (DISPOSABLE) ×4 IMPLANT
DRAPE COLUMN DVNC XI (DISPOSABLE) ×1 IMPLANT
DRAPE DA VINCI XI ARM (DISPOSABLE) ×8
DRAPE DA VINCI XI COLUMN (DISPOSABLE) ×2
DRAPE SHEET LG 3/4 BI-LAMINATE (DRAPES) ×2 IMPLANT
DRAPE SURG IRRIG POUCH 19X23 (DRAPES) ×2 IMPLANT
DRAPE WARM FLUID 44X44 (DRAPES) ×2 IMPLANT
DRSG OPSITE POSTOP 4X10 (GAUZE/BANDAGES/DRESSINGS) ×2 IMPLANT
DRSG OPSITE POSTOP 4X6 (GAUZE/BANDAGES/DRESSINGS) IMPLANT
DRSG OPSITE POSTOP 4X8 (GAUZE/BANDAGES/DRESSINGS) IMPLANT
ELECT PENCIL ROCKER SW 15FT (MISCELLANEOUS) ×2 IMPLANT
ELECT REM PT RETURN 15FT ADLT (MISCELLANEOUS) ×2 IMPLANT
GLOVE SURG ENC MOIS LTX SZ6 (GLOVE) ×8 IMPLANT
GLOVE SURG ENC MOIS LTX SZ6.5 (GLOVE) ×4 IMPLANT
GOWN STRL REUS W/ TWL LRG LVL3 (GOWN DISPOSABLE) ×4 IMPLANT
GOWN STRL REUS W/TWL LRG LVL3 (GOWN DISPOSABLE) ×8
HEMOSTAT HEMOBLAST BELLOWS (HEMOSTASIS) ×2 IMPLANT
HOLDER FOLEY CATH W/STRAP (MISCELLANEOUS) ×2 IMPLANT
IRRIG SUCT STRYKERFLOW 2 WTIP (MISCELLANEOUS) ×2
IRRIGATION SUCT STRKRFLW 2 WTP (MISCELLANEOUS) ×1 IMPLANT
KIT PROCEDURE DA VINCI SI (MISCELLANEOUS)
KIT PROCEDURE DVNC SI (MISCELLANEOUS) IMPLANT
KIT TURNOVER KIT A (KITS) ×2 IMPLANT
LIGASURE IMPACT 36 18CM CVD LR (INSTRUMENTS) ×2 IMPLANT
LOOP VESSEL MAXI BLUE (MISCELLANEOUS) ×2 IMPLANT
MANIPULATOR UTERINE 4.5 ZUMI (MISCELLANEOUS) ×2 IMPLANT
NEEDLE HYPO 21X1.5 SAFETY (NEEDLE) ×4 IMPLANT
NEEDLE SPNL 18GX3.5 QUINCKE PK (NEEDLE) IMPLANT
OBTURATOR OPTICAL STANDARD 8MM (TROCAR) ×2
OBTURATOR OPTICAL STND 8 DVNC (TROCAR) ×1
OBTURATOR OPTICALSTD 8 DVNC (TROCAR) ×1 IMPLANT
PACK ROBOT GYN CUSTOM WL (TRAY / TRAY PROCEDURE) ×2 IMPLANT
PAD POSITIONING PINK XL (MISCELLANEOUS) ×2 IMPLANT
PORT ACCESS TROCAR AIRSEAL 12 (TROCAR) ×1 IMPLANT
PORT ACCESS TROCAR AIRSEAL 5M (TROCAR) ×1
POUCH SPECIMEN RETRIEVAL 10MM (ENDOMECHANICALS) IMPLANT
RETRACTOR WND ALEXIS 25 LRG (MISCELLANEOUS) ×1 IMPLANT
RTRCTR WOUND ALEXIS 25CM LRG (MISCELLANEOUS) ×2
SCRUB CHG 4% DYNA-HEX 4OZ (MISCELLANEOUS) ×1 IMPLANT
SEAL CANN UNIV 5-8 DVNC XI (MISCELLANEOUS) ×4 IMPLANT
SEAL XI 5MM-8MM UNIVERSAL (MISCELLANEOUS) ×8
SET TRI-LUMEN FLTR TB AIRSEAL (TUBING) ×2 IMPLANT
SPONGE T-LAP 18X18 ~~LOC~~+RFID (SPONGE) ×8 IMPLANT
SURGIFLO W/THROMBIN 8M KIT (HEMOSTASIS) IMPLANT
SUT MNCRL AB 4-0 PS2 18 (SUTURE) ×2 IMPLANT
SUT PDS AB 1 TP1 96 (SUTURE) ×4 IMPLANT
SUT VIC AB 0 CT1 27 (SUTURE)
SUT VIC AB 0 CT1 27XBRD ANTBC (SUTURE) IMPLANT
SUT VIC AB 2-0 CT1 27 (SUTURE)
SUT VIC AB 2-0 CT1 TAPERPNT 27 (SUTURE) IMPLANT
SUT VIC AB 2-0 SH 27 (SUTURE) ×2
SUT VIC AB 2-0 SH 27X BRD (SUTURE) ×1 IMPLANT
SUT VIC AB 4-0 PS2 18 (SUTURE) ×8 IMPLANT
SUT VLOC 180 2-0 6IN GS21 (SUTURE) ×2 IMPLANT
SYR 10ML LL (SYRINGE) IMPLANT
SYR BULB IRRIG 60ML STRL (SYRINGE) ×2 IMPLANT
TOWEL OR NON WOVEN STRL DISP B (DISPOSABLE) ×2 IMPLANT
TRAP SPECIMEN MUCUS 40CC (MISCELLANEOUS) IMPLANT
TRAY FOLEY MTR SLVR 16FR STAT (SET/KITS/TRAYS/PACK) ×2 IMPLANT
TROCAR XCEL NON-BLD 5MMX100MML (ENDOMECHANICALS) IMPLANT
UNDERPAD 30X36 HEAVY ABSORB (UNDERPADS AND DIAPERS) ×2 IMPLANT
WATER STERILE IRR 1000ML POUR (IV SOLUTION) ×2 IMPLANT
YANKAUER SUCT BULB TIP 10FT TU (MISCELLANEOUS) ×2 IMPLANT

## 2020-12-01 NOTE — Discharge Instructions (Addendum)
12/01/2020  Medications that I would like you to take: Tylenol (as needed) for pain Lyrica - I sent a prescription for this today. You can use it at night before bed to help with sleep and with pain Senakot - this is the combination stool softener and laxative Miralax - I would plan to take this once a day until bowel function returns to normal Lovenox (injection) - this is a blood thinner that you have been receiving in the hospital. This is to help prevent you from getting a blood clot in the month after surgery. You will give this to yourself (or your husband will) once a day. Your nurse will teach you how to give the injections before you go home.  Phenergan (anti-nausea) - I sent a prescription in today for this. This is to take if you need to take a stronger pain medication and want to prevent nausea. Take this 20-30 minutes before you plan to take the pain pill. Ibuprofen - as needed for pain Tramadol - as needed for pain Maalox (helps with stomach irritation, reflux) - I sent a prescription in today for this to use if needed.  Activity: 1. Be up and out of the bed during the day.  Take a nap if needed.  You may walk up steps but be careful and use the hand rail.  Stair climbing will tire you more than you think, you may need to stop part way and rest.   2. No lifting or straining for 6 weeks.  3. No driving until you are off narcotics and feel that you can brake safely. For most patient this is 1-2 weeks.    4. Shower daily.  Use soap and water on your incision and pat dry; don't rub.   5. No sexual activity and nothing in the vagina for at least 8 weeks.  Medications:  - Take ibuprofen and tylenol first line for pain control. Take these regularly (every 6 hours) to decrease the build up of pain.  - If necessary, for severe pain not relieved by ibuprofen, take tramadol.  - While taking tramadol you should take sennakot every night to reduce the likelihood of constipation. If this  causes diarrhea, stop its use.  Diet: 1. Low sodium Heart Healthy Diet is recommended.  2. It is safe to use a laxative if you have difficulty moving your bowels.   Wound Care: 1. Keep clean and dry.  Shower daily.  Reasons to call the Doctor:  Fever - Oral temperature greater than 100.4 degrees Fahrenheit Foul-smelling vaginal discharge Difficulty urinating Nausea and vomiting Increased pain at the site of the incision that is unrelieved with pain medicine. Difficulty breathing with or without chest pain New calf pain especially if only on one side Sudden, continuing increased vaginal bleeding with or without clots.   Follow-up: 1. See Jeral Pinch in 3 weeks as scheduled. You'll have a follow-up by phone (scheduled on 7/11 but may be before that) to review pathology from surgery with Dr. Berline Lopes.  Contacts: For questions or concerns you should contact:  Dr. Jeral Pinch at [phone] After hours and on week-ends call [phone] and ask to speak to the physician on call for Gynecologic Oncology

## 2020-12-01 NOTE — Brief Op Note (Signed)
12/01/2020  1:36 PM  PATIENT:  [name] [name]  65 y.o. 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  PROCEDURE:  Procedure(s): XI ROBOTIC ASSISTED TOTAL HYSTERECTOMY BILATERAL SALPINGO OOPHORECTOMY WITH  LAPAROTOMY, OMENTECTOMY AND DEBULKING (N/A) LYMPH NODE DISSECTION (N/A)  SURGEON:  Surgeon(s) and Role:    Lafonda Mosses, MD - Primary    * Lahoma Crocker, MD - Assisting  ANESTHESIA:   general  EBL:  200 mL   BLOOD ADMINISTERED:none  DRAINS: none   LOCAL MEDICATIONS USED:  MARCAINE     SPECIMEN:  uterus, cervix, bilateral adnexa, omentum, presacral/retroperitoneal adenopathy  DISPOSITION OF SPECIMEN:  PATHOLOGY  COUNTS:  YES  TOURNIQUET:  * No tourniquets in log *  DICTATION: .Note written in EPIC  PLAN OF CARE: Admit for overnight observation  PATIENT DISPOSITION:  PACU - hemodynamically stable.   Delay start of Pharmacological VTE agent (>24hrs) due to surgical blood loss or risk of bleeding: no

## 2020-12-01 NOTE — Plan of Care (Signed)

## 2020-12-01 NOTE — Anesthesia Preprocedure Evaluation (Signed)
Anesthesia Evaluation  Patient identified by MRN, date of birth, ID band Patient awake    Reviewed: Allergy & Precautions, NPO status , Patient's Chart, lab work & pertinent test results  History of Anesthesia Complications (+) PONV  Airway Mallampati: II  TM Distance: >3 FB Neck ROM: Full    Dental no notable dental hx.    Pulmonary neg pulmonary ROS,    Pulmonary exam normal breath sounds clear to auscultation       Cardiovascular negative cardio ROS Normal cardiovascular exam Rhythm:Regular Rate:Normal     Neuro/Psych negative neurological ROS  negative psych ROS   GI/Hepatic negative GI ROS, Neg liver ROS,   Endo/Other  negative endocrine ROS  Renal/GU negative Renal ROS  negative genitourinary   Musculoskeletal negative musculoskeletal ROS (+)   Abdominal   Peds negative pediatric ROS (+)  Hematology negative hematology ROS (+)   Anesthesia Other Findings   Reproductive/Obstetrics negative OB ROS                             Anesthesia Physical Anesthesia Plan  ASA: 2  Anesthesia Plan: General   Post-op Pain Management:    Induction: Intravenous  PONV Risk Score and Plan: 4 or greater and Ondansetron, Dexamethasone, Treatment may vary due to age or medical condition, TIVA and Scopolamine patch - Pre-op  Airway Management Planned: Oral ETT  Additional Equipment:   Intra-op Plan:   Post-operative Plan: Extubation in OR  Informed Consent: I have reviewed the patients History and Physical, chart, labs and discussed the procedure including the risks, benefits and alternatives for the proposed anesthesia with the patient or authorized representative who has indicated his/her understanding and acceptance.     Dental advisory given  Plan Discussed with: CRNA and Surgeon  Anesthesia Plan Comments: (TIVA to help minimize delayed awakening )        Anesthesia Quick  Evaluation

## 2020-12-01 NOTE — Op Note (Signed)
OPERATIVE NOTE  Pre-operative Diagnosis: low grade serous carcinoma with significant retroperitoneal adenopathy, no other obvious imaging evidence of disease  Post-operative Diagnosis: same, significantly adherent presacral and para-aortic lymphadenopathy  Operation: Robotic-assisted laparoscopic total hysterectomy with bilateral salpingo-oophorectomy, laparotomy, infra-colic omentectomy, portion of necrotic tumor-ridden retroperitoneal lymph nodes, oversew of sigmoid serosa  Surgeon: Jeral Pinch MD  Assistant Surgeon: Lahoma Crocker MD (an MD assistant was necessary for tissue manipulation, management of robotic instrumentation, retraction and positioning due to the complexity of the case and hospital policies).   Intra-operative consultant:  Everitt Amber MD  Anesthesia: GET  Urine Output: 200  Operative Findings: On EUA, small mobile uterus. ON intra-abdominal entry, normal liver and diaphragm. One filmy adhesion noted between the right liver lobe and the anterior abdominal wall. The spleen was somewhat lateral and appeared mildly enlarged. There were some adhesions of the infragastric omentum to the anterior abdominal wall in the midline. The omentum was normal appearing without obvious evidence of tumor. Small and large bowel were normal appearing, appendix normal. Uterus 6cm and normal in appearance. Somewhat atrophic bilateral adnexa. There were filmy adhesions between each adnexa and the pelvic sidewall. Some filmy adhesions were also noted between the posterior uterus and rectum. Approximately 0.5-1cm white nodule within the broad ligament, lateral to the right cornua, c/w possible fibroma vs fibroid. No pelvic adenopathy. Significantly enlarged, necrotic, and tumor-filled lymph node conglomeration was noted starting within the presacral space (adherent to the sacral promontory) and extending superiorly along the great vessels to the level of the renal vessel (suspected). Given  adherence of the involved lymph nodes, I could not visualize this anatomy, I could long palpate along each lateral aspect of the aorta. There was no identifiable plane between matted lymph nodes and underlying aorta or IVC. Given burden of disease and my concern that resection was not feasible, I asked my senior partner (Dr. Denman George) to evaluate intra-op. She agreed that moving forward with attempt at debulking involved lymph nodes was likely not feasible and an attempt at this time would be unsafe.   There was no obvious peritoneal disease and no clear involvement of gyn organs. Despite prior biopsy, findings at surgery remain concerning for a non-gynecologic process. If this is confirmed to be low-grade serous carcinoma of gyn origin, this was an R2 resection.   Estimated Blood Loss:  200 mL      Total IV Fluids: see I&O flowsheet         Specimens: uterus, cervix, bilateral tubes and ovaries, omentum, retroperitoneal (para-aortic/presacral) lymph nodes         Complications:  Brief monopolar electrocautery applied to a small (67mm) area of sigmoid serosa during laterization of uterine vessels on the left - this area of serosa was oversewed with interrupted 2-0 Vicryl and reinforced by running 2-0 V-Lock; patient tolerated the procedure well.         Disposition: PACU - hemodynamically stable.  Procedure Details  The patient was seen in the Holding Room. The risks, benefits, complications, treatment options, and expected outcomes were discussed with the patient.  The patient concurred with the proposed plan, giving informed consent.  The site of surgery properly noted/marked. The patient was identified as [name] [name] and the procedure verified as a Robotic-assisted hysterectomy with bilateral salpingo oophorectomy, lymph node debulking, possible staging procedures, possible laparotomy.   After induction of anesthesia, the patient was draped and prepped in the usual sterile manner. Patient was  placed in supine position after anesthesia and draped  and prepped in the usual sterile manner as follows: Her arms were tucked to her side with all appropriate precautions.  The shoulders were stabilized with padded shoulder blocks applied to the acromium processes.  The patient was placed in the semi-lithotomy position in [place].  The perineum and vagina were prepped with CholoraPrep. The patient was draped after the CholoraPrep had been allowed to dry for 3 minutes.  A Time Out was held and the above information confirmed.  The urethra was prepped with Betadine. Foley catheter was placed.  A sterile speculum was placed in the vagina.  The cervix was grasped with a single-tooth tenaculum. The cervix was dilated with Kennon Rounds dilators.  The ZUMI uterine manipulator with a medium colpotomizer ring was placed without difficulty.  A pneum occluder balloon was placed over the manipulator.  OG tube placement was confirmed and to suction.   Next, a 10 mm skin incision was made 1 cm below the subcostal margin in the midclavicular line.  The 5 mm Optiview port and scope was used for direct entry.  Opening pressure was under 10 mm CO2.  The abdomen was insufflated and the findings were noted as above.   At this point and all points during the procedure, the patient's intra-abdominal pressure did not exceed 15 mmHg. Next, an 8 mm skin incision was made superior to the umbilicus and a right and left port were placed about 8 cm lateral to the robot port on the right and left side.  A fourth arm was placed on the right.  The 5 mm assist trocar was exchanged for a 10-12 mm port. All ports were placed under direct visualization.  The patient was placed in steep Trendelenburg.  Bowel was folded away into the upper abdomen.  The robot was docked in the normal manner.  The right and left peritoneum were opened parallel to the IP ligament to open the retroperitoneal spaces bilaterally. The round ligaments were transected.  The ureter was noted to be on the medial leaf of the broad ligament.  The peritoneum above the ureter was incised and stretched and the infundibulopelvic ligament was skeletonized, cauterized and cut.    The posterior peritoneum was taken down to the level of the KOH ring.  The anterior peritoneum was also taken down.  The bladder flap was created to the level of the KOH ring.  The uterine artery on the right side was skeletonized, cauterized and cut in the normal manner.  A similar procedure was performed on the left.  The colpotomy was made and the uterus, cervix, bilateral ovaries and tubes were amputated and delivered through the vagina.  Pedicles were inspected and excellent hemostasis was achieved.    During lateralization of the uterine vessels on the left, monopolar electrocautery was inadvertently and briefly applied to the serosa of the sigmoid colon. This was immediately recognized. Given small and superficial area affected, decision made to oversew the sigmoid colon with interrupted stitches of 2-0 Vicryl. This was reinforced by a running layer of 2-0 V-lock.   The colpotomy at the vaginal cuff was closed with Vicryl on a CT1 needle in a running manner.  Irrigation was used and excellent hemostasis was achieved.    At this point, attention was turned to the mid-abdomen. The right ureter was again noted and elevated laterally and anteriorly. The peritoneum overlaying the right external iliac artery was cauterized until the inferior aspect of the lymph node conglomeration. No clear plane was evident between the vessel  and the necrotic lymph nodes and further dissection did not appear safe. During this initial attempt to identify a plane between the lymph node packet, manipulation of the inferior aspect of the bundle caused rupture of the tumor-laden lymph node with extrusion of tumor. This was collected to be sent for pathology. The decision was made to proceed with laparotomy for palpation to aide  in decision about whether debulking was safe and feasible. Robotic instruments were removed under direct visulaization.  The robot was undocked.    With the abdomen still insufflated, a midline vertical incision was made extending the peri-umbilical incision and carried through the subcutaneous tissue to the fascia. The fascial incision was made and extended superiorally. The rectus muscles were separated. The peritoneum was identified and entered. Peritoneal incision was extended longitudinally.  The abdominal cavity was entered sharply and without incident.   After the upper abdomen was explored manually, an Alexis retractor was placed in the incision and the Bookwalter retractor was then placed. First, the transverse colon was inspected and an infragastric omentectomy was performed using monopolar electrocautery and the Ligasure device. Hemostasis was assured.   The small bowel was then packed into the upper abdomen using the Bookwalter retractor.  With the right ureter in view, attention was turned back to the right common iliac and the adenopathy. After extensive palpation of the massive adenopathy, no clear plane was identifiable. Given my concern that lymph nodes were not resectable, I called Dr. Denman George in for consultation. After exploration, she agreed that the lymph nodes were not debulkable without significant risk and morbidity. She recommended aborting further surgery.    The pelvis and abdomen were irrigated. Given oozing tumor bed at the level of the bifurcation, hemoblast was placed within the bed and pressure held for 3 minutes. Good hemostasis was noted after.  The fascia was reapproximated with 0 looped PDS using a total of two sutures. The subcutaneous layer was then irrigated copiously.  Exparel was injected for local anesthesia. The subcutaneous layer was reapproximated with interrupted 2-0 Vicryl. The subcutaneous layer was then reapproximated with a running 4-0 Monocryl.  The fascia  at the 10-12 mm port was closed with 0 Vicryl on a UR-5 needle.  The subcuticular tissue was closed with 4-0 Vicryl and the skin was closed with 4-0 Monocryl in a subcuticular manner.  Dermabond was applied to all incisions.    The vagina was swabbed with  minimal bleeding noted.   All sponge, lap and needle counts were correct x  3.   The patient was transferred to the recovery room in stable condition.  Jeral Pinch, MD

## 2020-12-01 NOTE — Interval H&P Note (Signed)
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  The various methods of treatment have been discussed with the patient and family. After consideration of risks, benefits and other options for treatment, the patient has consented to  Procedure(s): XI ROBOTIC ASSISTED TOTAL HYSTERECTOMY BILATERAL SALPINGO OOPHORECTOMY WITH OMENTECTOMY AND DEBULKING, POSSIBLE MINI LAPAROTOMY, POSSIBLE LAPAROTOMY (N/A) LYMPH NODE DISSECTION (N/A) as a surgical intervention.  The patient's history has been reviewed, patient examined, no change in status, stable for surgery.  I have reviewed the patient's chart and labs.  Questions were answered to the patient's satisfaction.     Lafonda Mosses

## 2020-12-01 NOTE — Anesthesia Procedure Notes (Signed)
Procedure Name: Intubation Date/Time: 12/01/2020 9:56 AM Performed by: Gwyndolyn Saxon, CRNA Pre-anesthesia Checklist: Patient identified, Emergency Drugs available, Suction available and Patient being monitored Patient Re-evaluated:Patient Re-evaluated prior to induction Oxygen Delivery Method: Circle system utilized Preoxygenation: Pre-oxygenation with 100% oxygen Induction Type: IV induction Ventilation: Mask ventilation without difficulty and Oral airway inserted - appropriate to patient size Laryngoscope Size: Sabra Heck and 2 Grade View: Grade III Tube type: Oral Tube size: 6.5 mm Number of attempts: 1 Airway Equipment and Method: Patient positioned with wedge pillow and Stylet Placement Confirmation: ETT inserted through vocal cords under direct vision, positive ETCO2 and breath sounds checked- equal and bilateral Secured at: 20 cm Tube secured with: Tape Dental Injury: Teeth and Oropharynx as per pre-operative assessment  Comments: Pt with previous neck surgery; limited ROM, Grade II-III with cricoid pressure

## 2020-12-01 NOTE — Anesthesia Postprocedure Evaluation (Signed)
Anesthesia Post Note  Patient: [name] [name]  Procedure(s) Performed: XI ROBOTIC ASSISTED TOTAL HYSTERECTOMY BILATERAL SALPINGO OOPHORECTOMY WITH  LAPAROTOMY, OMENTECTOMY AND DEBULKING LYMPH NODE DISSECTION     Patient location during evaluation: PACU Anesthesia Type: General Level of consciousness: awake and alert Pain management: pain level controlled Vital Signs Assessment: post-procedure vital signs reviewed and stable Respiratory status: spontaneous breathing, nonlabored ventilation, respiratory function stable and patient connected to nasal cannula oxygen Cardiovascular status: blood pressure returned to baseline and stable Postop Assessment: no apparent nausea or vomiting Anesthetic complications: no   No notable events documented.  Last Vitals:  Vitals:   12/01/20 1400 12/01/20 1415  BP: 117/60 (!) 110/98  Pulse: (!) 59 77  Resp: (!) 24 15  Temp:    SpO2: 100% 100%    Last Pain:  Vitals:   12/01/20 1415  TempSrc:   PainSc: Asleep                 Veronia Laprise S

## 2020-12-01 NOTE — Transfer of Care (Signed)
Immediate Anesthesia Transfer of Care Note  Patient: [name] [name]  Procedure(s) Performed: XI ROBOTIC ASSISTED TOTAL HYSTERECTOMY BILATERAL SALPINGO OOPHORECTOMY WITH  LAPAROTOMY, OMENTECTOMY AND DEBULKING LYMPH NODE DISSECTION  Patient Location: PACU  Anesthesia Type:General  Level of Consciousness: drowsy  Airway & Oxygen Therapy: Patient Spontanous Breathing and Patient connected to face mask oxygen  Post-op Assessment: Report given to RN and Post -op Vital signs reviewed and stable  Post vital signs: Reviewed and stable  Last Vitals:  Vitals Value Taken Time  BP 121/61 12/01/20 1348  Temp    Pulse 58 12/01/20 1351  Resp 15 12/01/20 1351  SpO2 100 % 12/01/20 1351  Vitals shown include unvalidated device data.  Last Pain:  Vitals:   12/01/20 0841  TempSrc:   PainSc: 3       Patients Stated Pain Goal: 1 (68/03/21 2248)  Complications: No notable events documented.

## 2020-12-02 ENCOUNTER — Encounter (HOSPITAL_COMMUNITY): Payer: Self-pay | Admitting: Gynecologic Oncology

## 2020-12-02 DIAGNOSIS — M797 Fibromyalgia: Secondary | ICD-10-CM | POA: Diagnosis present

## 2020-12-02 DIAGNOSIS — I96 Gangrene, not elsewhere classified: Secondary | ICD-10-CM | POA: Diagnosis present

## 2020-12-02 DIAGNOSIS — Z79899 Other long term (current) drug therapy: Secondary | ICD-10-CM | POA: Diagnosis not present

## 2020-12-02 DIAGNOSIS — Z85820 Personal history of malignant melanoma of skin: Secondary | ICD-10-CM | POA: Diagnosis not present

## 2020-12-02 DIAGNOSIS — Z801 Family history of malignant neoplasm of trachea, bronchus and lung: Secondary | ICD-10-CM | POA: Diagnosis not present

## 2020-12-02 DIAGNOSIS — C481 Malignant neoplasm of specified parts of peritoneum: Secondary | ICD-10-CM | POA: Diagnosis present

## 2020-12-02 DIAGNOSIS — Z20822 Contact with and (suspected) exposure to covid-19: Secondary | ICD-10-CM | POA: Diagnosis present

## 2020-12-02 DIAGNOSIS — R590 Localized enlarged lymph nodes: Secondary | ICD-10-CM | POA: Diagnosis present

## 2020-12-02 DIAGNOSIS — N736 Female pelvic peritoneal adhesions (postinfective): Secondary | ICD-10-CM | POA: Diagnosis present

## 2020-12-02 DIAGNOSIS — F32A Depression, unspecified: Secondary | ICD-10-CM | POA: Diagnosis present

## 2020-12-02 LAB — BASIC METABOLIC PANEL
Anion gap: 11 (ref 5–15)
BUN: 10 mg/dL (ref 8–23)
CO2: 24 mmol/L (ref 22–32)
Calcium: 8.8 mg/dL — ABNORMAL LOW (ref 8.9–10.3)
Chloride: 98 mmol/L (ref 98–111)
Creatinine, Ser: 0.9 mg/dL (ref 0.44–1.00)
GFR, Estimated: >60 mL/min (ref >60)
Glucose, Bld: 169 mg/dL — ABNORMAL HIGH (ref 70–99)
Potassium: 4.3 mmol/L (ref 3.5–5.1)
Sodium: 133 mmol/L — ABNORMAL LOW (ref 135–145)

## 2020-12-02 LAB — CBC
HCT: 37.8 % (ref 36.0–46.0)
Hemoglobin: 12.7 g/dL (ref 12.0–15.0)
MCH: 30.7 pg (ref 26.0–34.0)
MCHC: 33.6 g/dL (ref 30.0–36.0)
MCV: 91.3 fL (ref 80.0–100.0)
Platelets: 215 10*3/uL (ref 150–400)
RBC: 4.14 MIL/uL (ref 3.87–5.11)
RDW: 11.9 % (ref 11.5–15.5)
WBC: 15.8 10*3/uL — ABNORMAL HIGH (ref 4.0–10.5)
nRBC: 0 % (ref 0.0–0.2)

## 2020-12-02 MED ORDER — KETOROLAC TROMETHAMINE 15 MG/ML IJ SOLN: 15.0000 mg | Freq: Three times a day (TID) | INTRAMUSCULAR | Status: DC

## 2020-12-02 MED ORDER — ALUM & MAG HYDROXIDE-SIMETH 200-200-20 MG/5ML PO SUSP
30.0000 mL | ORAL | Status: DC | PRN
  Administered 2020-12-02: 30 mL via ORAL
  Filled 2020-12-02: qty 30

## 2020-12-02 MED ORDER — POLYETHYLENE GLYCOL 3350 17 G PO PACK
17.0000 g | PACK | Freq: Every day | ORAL | Status: DC
Start: 2020-12-02 — End: 2020-12-03
  Administered 2020-12-02 – 2020-12-03 (×2): 17 g via ORAL
  Filled 2020-12-02 (×2): qty 1

## 2020-12-02 MED ORDER — ALUM & MAG HYDROXIDE-SIMETH 200-200-20 MG/5ML PO SUSP: 30.0000 mL | Freq: Once | ORAL | Status: DC

## 2020-12-02 MED ORDER — LORAZEPAM 0.5 MG PO TABS
0.5000 mg | ORAL_TABLET | Freq: Four times a day (QID) | ORAL | Status: DC | PRN
  Administered 2020-12-02: 0.5 mg via ORAL
  Filled 2020-12-02: qty 1

## 2020-12-02 MED ORDER — KETOROLAC TROMETHAMINE 15 MG/ML IJ SOLN
15.0000 mg | Freq: Four times a day (QID) | INTRAMUSCULAR | Status: AC
  Administered 2020-12-02 – 2020-12-03 (×4): 15 mg via INTRAVENOUS
  Filled 2020-12-02 (×4): qty 1

## 2020-12-02 MED ORDER — BOOST / RESOURCE BREEZE PO LIQD CUSTOM
1.0000 | Freq: Three times a day (TID) | ORAL | Status: DC
Start: 2020-12-02 — End: 2020-12-03
  Administered 2020-12-02 – 2020-12-03 (×3): 1 via ORAL

## 2020-12-02 NOTE — Progress Notes (Signed)
GYN Oncology Post-Op  1 Day Post-Op Procedure(s) (LRB): XI ROBOTIC ASSISTED TOTAL HYSTERECTOMY BILATERAL SALPINGO OOPHORECTOMY WITH  LAPAROTOMY, OMENTECTOMY AND DEBULKING (N/A) LYMPH NODE DISSECTION (N/A)  Subjective: Patient reports feeling "not good". "My stomach is all messed up." Reporting intermittent nausea with no emesis. States she was having stomach issues prior to surgery. Reports burning and pain around her stomach. Has not taken in significant amount of oral intake. Does not want to take tramadol since she feels it made her pain worse and abdominal distension worse. She has been ambulating without difficulty without dizziness or lightheadedness. Voiding without difficulty. Has had 2 episodes of passing flatus. No BM reported since Saturday. Denies chest pain, dyspnea.   Objective: Vital signs in last 24 hours: Temp:  [97.8 F (36.6 C)-98.4 F (36.9 C)] 98.2 F (36.8 C) (07/06 0436) Pulse Rate:  [59-77] 70 (07/06 0436) Resp:  [14-24] 18 (07/05 2154) BP: (77-158)/(54-98) 139/70 (07/06 0436) SpO2:  [96 %-100 %] 96 % (07/06 0436) Weight:  [138 lb 14.2 oz (63 kg)] 138 lb 14.2 oz (63 kg) (07/05 1930) Last BM Date: 11/28/20  Intake/Output from previous day: 07/05 0701 - 07/06 0700 In: 3174.1 [I.V.:2974.1; IV Piggyback:200] Out: 2550 [Urine:2350; Blood:200]  Physical Examination: General: alert, cooperative, appears stated age, and appears uncomfortable Resp: clear to auscultation bilaterally Cardio: regular rate and rhythm, S1, S2 normal, no murmur, click, rub or gallop GI: incision: lap sites to the abdomen with dermabond and laparotomy incision with op site dressing in place with no drainage noted underneath. Abdomen tender to palpation, slightly distended and tympanic on percussion. Extremities: extremities normal, atraumatic, no cyanosis or edema  Labs: WBC/Hgb/Hct/Plts:  15.8/12.7/37.8/215 (07/06 0315) BUN/Cr/glu/ALT/AST/amyl/lip:  10/0.90/--/--/--/--/-- (07/06  0315)  Assessment: 65 y.o. s/p Procedure(s): XI ROBOTIC ASSISTED TOTAL HYSTERECTOMY BILATERAL SALPINGO OOPHORECTOMY WITH  LAPAROTOMY, OMENTECTOMY AND DEBULKING LYMPH NODE DISSECTION: stable Pain:  Pain is not well-controlled on PRN medications. Refusing to use tramadol and can not take ibuprofen. Kpad ordered. IV toradol ordered Q6H for the next 24 hours.  Heme: Hgb 12.7 and Hct 37.8 this am. Appropriate compared with pre-operative values and surgical losses.  CV: BP and HR stable post-operatively. Continue to monitor with ordered vital signs.  GI:  Tolerating po: small amounts. Antiemetics ordered PRN. Maalox ordered.   GU: Voiding adequate amounts since foley removal. Creatinine 0.90 this am.  FEN: No critical values reported.   Prophylaxis: SCDs and lovenox ordered.  Plan: IVF to 50 cc/hr Diet as tolerated Maalox ordered. Bowel regimen of miralax and sennakot-S ordered. Kpad for symptom relief Toradol ordered every 6 hours for the next 24 hours Binder at the bedside if needed Encouraged ambulation, IS use, deep breathing, and coughing Plan for probable discharge tomorrow if pain is better controlled, able to tolerate diet.  The patient is to be discharged to home.   LOS: 0 days    Dorothyann Gibbs 12/02/2020, 10:13 AM

## 2020-12-03 ENCOUNTER — Other Ambulatory Visit: Payer: Self-pay | Admitting: Gynecologic Oncology

## 2020-12-03 DIAGNOSIS — C569 Malignant neoplasm of unspecified ovary: Secondary | ICD-10-CM

## 2020-12-03 LAB — BASIC METABOLIC PANEL
Anion gap: 5 (ref 5–15)
BUN: 10 mg/dL (ref 8–23)
CO2: 29 mmol/L (ref 22–32)
Calcium: 8.6 mg/dL — ABNORMAL LOW (ref 8.9–10.3)
Chloride: 104 mmol/L (ref 98–111)
Creatinine, Ser: 0.84 mg/dL (ref 0.44–1.00)
GFR, Estimated: >60 mL/min (ref >60)
Glucose, Bld: 125 mg/dL — ABNORMAL HIGH (ref 70–99)
Potassium: 3.6 mmol/L (ref 3.5–5.1)
Sodium: 138 mmol/L (ref 135–145)

## 2020-12-03 LAB — CBC
HCT: 38.8 % (ref 36.0–46.0)
Hemoglobin: 12.9 g/dL (ref 12.0–15.0)
MCH: 30.9 pg (ref 26.0–34.0)
MCHC: 33.2 g/dL (ref 30.0–36.0)
MCV: 93 fL (ref 80.0–100.0)
Platelets: 197 10*3/uL (ref 150–400)
RBC: 4.17 MIL/uL (ref 3.87–5.11)
RDW: 12.4 % (ref 11.5–15.5)
WBC: 8.1 10*3/uL (ref 4.0–10.5)
nRBC: 0 % (ref 0.0–0.2)

## 2020-12-03 MED ORDER — PREGABALIN 25 MG PO CAPS: 25.0000 mg | ORAL_CAPSULE | Freq: Two times a day (BID) | ORAL | 0 refills | Status: DC

## 2020-12-03 MED ORDER — ENOXAPARIN (LOVENOX) PATIENT EDUCATION KIT
PACK | Freq: Once | Status: AC
Start: 2020-12-03 — End: 2020-12-03
  Filled 2020-12-03: qty 1

## 2020-12-03 MED ORDER — ALUM & MAG HYDROXIDE-SIMETH 200-200-20 MG/5ML PO SUSP: 30.0000 mL | ORAL | 0 refills | Status: DC | PRN

## 2020-12-03 MED ORDER — ENOXAPARIN SODIUM 40 MG/0.4ML IJ SOSY: 40.0000 mg | PREFILLED_SYRINGE | INTRAMUSCULAR | 0 refills | Status: DC

## 2020-12-03 MED ORDER — PROMETHAZINE HCL 12.5 MG PO TABS: 12.5000 mg | ORAL_TABLET | Freq: Four times a day (QID) | ORAL | 0 refills | Status: DC | PRN

## 2020-12-03 NOTE — Discharge Summary (Signed)
Physician Discharge Summary  Patient ID: PEBBLE BOTKIN MRN: [id number] DOB/AGE: November 16, 1955 65 y.o.  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  good  Hospital Course: The patient presented on 7/5 and underwent robotic assisted total hysterectomy and bilateral salpingo-oophorectomy with subsequent conversion to open for abdominal exploration,, omentectomy, and ultimately aborted resection of necrotic and tumor laden retroperitoneal adenopathy.  Given her sensitivity to medications, the patient declined taking many postoperative medications.  She had increased pain and generally did not feel well on postoperative day #1.  By postoperative day #2, she was voiding freely, reporting flatus, tolerating liquids without nausea or emesis, and having small amounts of solid intake.  She was ambulating without difficulty and denied feeling dizzy or lightheaded.  She is discharged home in stable condition.  Consults: None  Significant Diagnostic Studies:  CBC    Component Value Date/Time   WBC 8.1 12/03/2020 0800   RBC 4.17 12/03/2020 0800   HGB 12.9 12/03/2020 0800   HCT 38.8 12/03/2020 0800   PLT 197 12/03/2020 0800   MCV 93.0 12/03/2020 0800   MCH 30.9 12/03/2020 0800   MCHC 33.2 12/03/2020 0800   RDW 12.4 12/03/2020 0800   LYMPHSABS 0.9 11/23/2007 1916   MONOABS 0.6 11/23/2007 1916   EOSABS 0.0 11/23/2007 1916   BASOSABS 0.0 11/23/2007 1916   CMP Latest Ref Rng & Units 12/03/2020 12/02/2020 11/27/2020  Glucose 70 - 99 mg/dL 125(H) 169(H) 102(H)  BUN 8 - 23 mg/dL 10 10 12   Creatinine 0.44 - 1.00 mg/dL 0.84 0.90 0.77  Sodium 135 - 145 mmol/L 138 133(L) 139  Potassium 3.5 - 5.1 mmol/L  3.6 4.3 3.9  Chloride 98 - 111 mmol/L 104 98 105  CO2 22 - 32 mmol/L 29 24 28   Calcium 8.9 - 10.3 mg/dL 8.6(L) 8.8(L) 9.6   Treatments: IV hydration and surgery (as described above)  Discharge Exam: Blood pressure 132/78, pulse 67, temperature 98.1 F (36.7 C), temperature source Oral, resp. rate 18, height 5\' 3"  (1.6 m), weight 138 lb 14.2 oz (63 kg), SpO2 97 %. Gen: No acute distress, alert and oriented Cardiovascular: Regular rate and rhythm, no murmurs rubs or gallops Pulmonary: Lungs clear to auscultation bilaterally, no wheezes or rhonchi Abdomen: Moderately tender to palpation diffusely, mildly distended, normoactive bowel sounds, incisions clean dry and intact with Dermabond in place and honeycomb dressing over laparotomy incision.  Some periincisional bruising noted around laparotomy incision. Extremities: Warm and well perfused, no calf tenderness or edema  Disposition: Discharge disposition: 01-Home or Self Care       Discharge Instructions     Call MD for:   Complete by: As directed    Call MD for:  difficulty breathing, headache or visual disturbances   Complete by: As directed    Call MD for:  extreme fatigue   Complete by: As directed    Call MD for:  persistant dizziness or light-headedness   Complete by: As directed    Call MD for:  persistant nausea and vomiting   Complete by: As directed    Call MD for:  redness, tenderness, or signs of infection (pain, swelling, redness, odor or green/yellow discharge around incision site)   Complete by: As directed    Call MD for:  severe uncontrolled pain   Complete by: As directed    Call MD for:  temperature >100.4   Complete by: As directed    Diet - low sodium heart healthy   Complete by: As directed    Discharge wound care:   Complete by: As directed    Take honeycomb dressing off 5 days after surgery (Sunday). You can shower with your incisions (don't scrub them, pat them dry).   Increase activity slowly    Complete by: As directed       Allergies as of 12/03/2020       Reactions   Ciprofloxacin-ciproflox Hcl Er Nausea And Vomiting   Amoxicillin Nausea And Vomiting   Pt received ancef on 12-01-20 without issue   Bupropion    Other reaction(s): GI Upset (intolerance)   Codeine Nausea And Vomiting   Duloxetine    Other reaction(s): Other (See Comments) High blood pressure response   Methylphenidate    Other reaction(s): Other (See Comments) BLISTERS   Other    Narcotics=nausea/vomiting (sick) & behavioral changes ("makes me crazy")   Latex Rash        Medication List     TAKE these medications    alum & mag hydroxide-simeth 200-200-20 MG/5ML suspension Commonly known as: MAALOX/MYLANTA Take 30 mLs by mouth every 4 (four) hours as needed for up to 6 doses for indigestion or heartburn.   CRANBERRY CONCENTRATE/VITAMINC PO Take 2 tablets by mouth in the morning.   enoxaparin 40 MG/0.4ML injection Commonly known as: LOVENOX Inject 0.4 mLs (40 mg total) into the skin daily for 26 doses.   FISH OIL CONCENTRATE PO Take 1 capsule by mouth 3 (three) times a week.   FLUoxetine 20 MG tablet Commonly known as: PROZAC Take 10 mg by mouth every evening.   pregabalin 25 MG capsule Commonly known as: LYRICA Take 1 capsule (25 mg total) by mouth 2 (two) times daily.   PREVAGEN PO Take 1 tablet by mouth in the morning.   PROBIOTIC PO Take 1 capsule by mouth in the morning. OLLY [employer] [place] Women Probiotic Capsules   promethazine 12.5 MG tablet Commonly known as: PHENERGAN Take 1 tablet (12.5 mg total) by mouth every 6 (six) hours as needed for up to 15 doses for nausea or vomiting.   senna-docusate 8.6-50 MG tablet Commonly known as: Senokot-S Take 2 tablets by mouth at bedtime. For AFTER surgery, do not take if having diarrhea   traMADol 50 MG tablet Commonly known as: ULTRAM Take 1 tablet (50 mg total) by mouth every 6 (six) hours as needed for severe pain. For AFTER  surgery pain only, do not take and drive               Discharge Care Instructions  (From admission, onward)           Start     Ordered   12/03/20 0000  Discharge wound care:       Comments: Take honeycomb dressing off 5 days after surgery (Sunday). You can shower with your incisions (don't scrub them, pat them dry).   12/03/20 1014             Greater than thirty minutes were spend for face to face discharge instructions and discharge orders/summary in EPIC.   Signed: Lafonda Mosses 12/03/2020, 10:29 AM

## 2020-12-04 ENCOUNTER — Telehealth: Payer: Self-pay

## 2020-12-04 NOTE — Telephone Encounter (Signed)
Spoke with Ms.Macari  She states she is eating small amounts of food, drinking fluids but not able to state amount of fluid and urinating well. Husband bought protein drinks today. She has started with diarrhea this am at 0230.  She had taken miralax a senokot-s yesterday in the hospital. The diarrhea is tapering off.  She stated that she will not take the laxatives again. Encouraged her to drink plenty of water and keep track of ounces and she needs to replace the fluid lost from diarrhea. She denies fever or chills. Incisions are dry and intact. Her pain is controlled with  Tylenol 500 mg prn. Pain is 3/10 in her abdomen. Instructed to call office with any fever, chills, purulent drainage, uncontrolled pain or any other questions or concerns. Patient verbalizes understanding.  Pt aware of post op appointments as well as the office number [phone] and after hours number [phone] to call if she has any questions or concerns

## 2020-12-07 ENCOUNTER — Telehealth: Payer: Self-pay | Admitting: Oncology

## 2020-12-07 ENCOUNTER — Inpatient Hospital Stay: Payer: Medicare Other | Attending: Hematology and Oncology | Admitting: Gynecologic Oncology

## 2020-12-07 DIAGNOSIS — K219 Gastro-esophageal reflux disease without esophagitis: Secondary | ICD-10-CM

## 2020-12-07 DIAGNOSIS — Z90710 Acquired absence of both cervix and uterus: Secondary | ICD-10-CM

## 2020-12-07 DIAGNOSIS — Z7901 Long term (current) use of anticoagulants: Secondary | ICD-10-CM | POA: Insufficient documentation

## 2020-12-07 DIAGNOSIS — C482 Malignant neoplasm of peritoneum, unspecified: Secondary | ICD-10-CM | POA: Insufficient documentation

## 2020-12-07 DIAGNOSIS — Z79899 Other long term (current) drug therapy: Secondary | ICD-10-CM | POA: Insufficient documentation

## 2020-12-07 DIAGNOSIS — C569 Malignant neoplasm of unspecified ovary: Secondary | ICD-10-CM

## 2020-12-07 DIAGNOSIS — Z7952 Long term (current) use of systemic steroids: Secondary | ICD-10-CM | POA: Insufficient documentation

## 2020-12-07 DIAGNOSIS — Z90722 Acquired absence of ovaries, bilateral: Secondary | ICD-10-CM

## 2020-12-07 MED ORDER — FAMOTIDINE 20 MG PO TABS: 20.0000 mg | ORAL_TABLET | Freq: Every day | ORAL | 3 refills | Status: DC

## 2020-12-07 NOTE — Telephone Encounter (Signed)
[name] [name] and scheduled a new patient apt with Dr. Alvy Bimler on 12/15/20 followed by Patient Education.  Advised her to arrive at 1:30 and to bring a family member with her.  She verbalized understanding and agreement.

## 2020-12-07 NOTE — Addendum Note (Signed)
Addended by: Elmo Putt R on: 12/07/2020 03:45 PM   Modules accepted: Orders

## 2020-12-07 NOTE — Progress Notes (Signed)
Gynecologic Oncology Telehealth Consult Note: Gyn-Onc  I connected with [name] [name] on 12/07/20 at 12:45 PM EDT by telephone and verified that I am speaking with the correct person using two identifiers.  I discussed the limitations, risks, security and privacy concerns of performing an evaluation and management service by telemedicine and the availability of in-person appointments. I also discussed with the patient that there may be a patient responsible charge related to this service. The patient expressed understanding and agreed to proceed.  Other persons participating in the visit and their role in the encounter: Patient's husband.  Patient's location: Home Provider's location: [facility]  Reason for Visit: Postoperative follow-up  Treatment History: The patient reports a history of chronic back pain and has had 2 prior back surgeries.  For at least a year, she remembers having back pain although not being able to tell if she also had abdominal pain.  Recently, her back surgeon ordered an MRI to look at her spine and this unfortunately saw significant retroperitoneal adenopathy.  This was followed by a CT and PET showing FDG avidity of her retroperitoneal adenopathy. CA-125 was mildly elevated at 96. CT-guided biopsy of adenopathy was c/w low grade serous carcinoma.  On 7/5, the patient underwent robotic assisted total hysterectomy and BSO, conversion to laparotomy with infracolic omentectomy, partial debulking of necrotic tumor red and retroperitoneal lymph nodes, oversewing of sigmoid serosa  Interval History: The patient reports having continued issues since discharge from the hospital.  She reports that her incisions are healing well with improving pain.  She denies any urinary symptoms.  She had some minimal spotting initially, but this has stopped and she denies any vaginal bleeding or discharge.  She denies any fevers or chills.  She continues to have significant  heartburn as well as episodes of feeling very bloated.  She has been having about 2 episodes of diarrhea a day, took Imodium recently.  She has not taken any stool softeners or laxatives since discharge.  Some of her bowel movements have been smaller and others larger.  She has been able to drink watered-down tea but has significant difficulty with anything that is sweet such as electrolyte solution.  This gives her significant indigestion.  She has been able to eat some although not very much.  Her husband estimates that she eats at most what is equivalent to half of a sandwich several times a day.  She has tried starting Prilosec (took 2 doses) for her heartburn.  She endorses minimal nausea but only associated with increased pain related to episodes of bloating.  She denies any emesis.  She endorses flatus with her diarrhea.  From a pain standpoint, she has been using Lyrica but really more to help her sleep.  She tried Tums for several days for her indigestion but did not feel like this helped much.  She also continues to take Maalox as well as Gas-X.  Past Medical/Surgical History: Past Medical History:  Diagnosis Date   Arthritis    Chronic pain    Contact lens/glasses fitting    wears contacts or glasses   Depression    Fibromyalgia    Melanoma ([place])    Peritoneal carcinoma ([place])    PONV (postoperative nausea and vomiting)    prolonged sedation very hard to wake    Past Surgical History:  Procedure Laterality Date   BACK SURGERY     lumb lam   BREAST BIOPSY Right    2021  BREAST BIOPSY Left    BREAST ENHANCEMENT SURGERY  05/30/1980   implants    BREAST IMPLANT EXCHANGE  05/30/2009   CARPAL TUNNEL RELEASE  05/30/2001   rt and lt   CARPOMETACARPEL SUSPENSION PLASTY  06/22/2012   Procedure: CARPOMETACARPEL ([place]) SUSPENSION PLASTY;  Surgeon: Alta Corning, MD;  Location: [facility];  Service: Orthopedics;  Laterality: Left;  burton's interposition arthroplasty left  thumb    CATARACT EXTRACTION W/ INTRAOCULAR LENS  IMPLANT, BILATERAL     COLONOSCOPY     x2   DIAGNOSTIC LAPAROSCOPY  05/30/2000   expl lap   DILATION AND CURETTAGE OF UTERUS  05/30/2001   ablation   ELBOW SURGERY  05/31/1999   decompression rt   EPIDURAL BLOCK INJECTION     multiple   LYMPH NODE DISSECTION N/A 12/01/2020   Procedure: LYMPH NODE DISSECTION;  Surgeon: Lafonda Mosses, MD;  Location: [facility];  Service: Gynecology;  Laterality: N/A;   SKIN CANCER EXCISION     melanoma, from abdomen   TONSILLECTOMY      Family History  Problem Relation Age of Onset   Lung cancer Father    Colon cancer Neg Hx    Breast cancer Neg Hx    Ovarian cancer Neg Hx    Endometrial cancer Neg Hx    Pancreatic cancer Neg Hx    Prostate cancer Neg Hx     Social History   Socioeconomic History   Marital status: Married    Spouse name: Not on file   Number of children: Not on file   Years of education: Not on file   Highest education level: Not on file  Occupational History   Not on file  Tobacco Use   Smoking status: Never   Smokeless tobacco: Never  Vaping Use   Vaping Use: Never used  Substance and Sexual Activity   Alcohol use: Yes    Comment: rare   Drug use: No   Sexual activity: Yes    Partners: Male    Birth control/protection: Post-menopausal  Other Topics Concern   Not on file  Social History Narrative   Not on file   Social Determinants of Health   Financial Resource Strain: Not on file  Food Insecurity: Not on file  Transportation Needs: Not on file  Physical Activity: Not on file  Stress: Not on file  Social Connections: Not on file    Current Medications:  Current Outpatient Medications:    famotidine (PEPCID) 20 MG tablet, Take 1 tablet (20 mg total) by mouth daily. Can take a second dose as needed daily, Disp: 40 tablet, Rfl: 3   alum & mag hydroxide-simeth (MAALOX/MYLANTA) 200-200-20 MG/5ML suspension, Take 30 mLs by mouth every 4 (four) hours as  needed for up to 6 doses for indigestion or heartburn., Disp: 180 mL, Rfl: 0   Apoaequorin (PREVAGEN PO), Take 1 tablet by mouth in the morning., Disp: , Rfl:    Cranberry-Vitamin C (CRANBERRY CONCENTRATE/VITAMINC PO), Take 2 tablets by mouth in the morning., Disp: , Rfl:    enoxaparin (LOVENOX) 40 MG/0.4ML injection, Inject 0.4 mLs (40 mg total) into the skin daily for 26 doses., Disp: 10.4 mL, Rfl: 0   FLUoxetine (PROZAC) 20 MG tablet, Take 10 mg by mouth every evening., Disp: , Rfl:    Omega-3 Fatty Acids (FISH OIL CONCENTRATE PO), Take 1 capsule by mouth 3 (three) times a week., Disp: , Rfl:    pregabalin (LYRICA) 25 MG capsule, Take 1  capsule (25 mg total) by mouth 2 (two) times daily., Disp: 20 capsule, Rfl: 0   Probiotic Product (PROBIOTIC PO), Take 1 capsule by mouth in the morning. OLLY [employer] Women Probiotic Capsules, Disp: , Rfl:    promethazine (PHENERGAN) 12.5 MG tablet, Take 1 tablet (12.5 mg total) by mouth every 6 (six) hours as needed for up to 15 doses for nausea or vomiting., Disp: 15 tablet, Rfl: 0   senna-docusate (SENOKOT-S) 8.6-50 MG tablet, Take 2 tablets by mouth at bedtime. For AFTER surgery, do not take if having diarrhea, Disp: 30 tablet, Rfl: 0   traMADol (ULTRAM) 50 MG tablet, Take 1 tablet (50 mg total) by mouth every 6 (six) hours as needed for severe pain. For AFTER surgery pain only, do not take and drive, Disp: 10 tablet, Rfl: 0  Review of Symptoms: Pertinent positives as per HPI.  Physical Exam: There were no vitals taken for this visit. Deferred given limitations of phone visit.  Laboratory & Radiologic Studies: Final pathology from surgery still pending at this time  Assessment & Plan: [name] [name] is a 65 y.o. woman with metastatic low-grade serous carcinoma of gynecologic origin.  The patient continues to have significant bowel function issues since surgery.  This was something that she struggled with prior to surgery.  I have recommended  that she not take any Imodium and also avoid the use of any laxatives.  She endorses being able to stay hydrated and we talked about smaller more frequent snacks instead of trying to eat larger meals.  To help with her indigestion, I sent in the prescription for Pepcid to her pharmacy.  We also discussed continued ambulation and that she could try chewing gum to help with improvement of her bowel function.  While she may have postoperative ileus or partial obstruction, she is not having nausea and emesis as I would have suspect with any sort of complete or partial obstruction.  She is very sensitive to medications and does not tolerate it medications well without significant side effects.  I recommended that she stop taking anything that may be contributed to her bowel dysfunction.  She is using Lyrica currently to help her sleep, and I have recommended that she use instead of Benadryl or Tylenol PM.  She will have periods of significant improvement in her abdominal pain and bloating after she has bowel function and flatus.  Hopeful that with improvement in her bowel dysfunction, she will generally feel better.  Patient knows that I will call on Wednesday to check in.  If she continues to have significant issues from a GI standpoint, we discussed getting a CT with oral contrast.  I received a call from one of the pathologist today.  The final report is still not signed but repeat additional stains support a gynecologic origin of this low-grade serous carcinoma.  It is estrogen receptor positive and progesterone receptor negative.  The only site of malignancy identified RDD necrotic appearing para-aortic and pericaval lymph nodes.  The patient and I had initially discussed her enrollment in a clinical trial, given the disease burden and significant amount of tumor that could not be debulked at the time of surgery, I recommend that we pursue traditional chemotherapy.  We discussed that this would mean 3 cycles of  carboplatin and paclitaxel followed by a CT scan to assess for interval disease improvement.  Prior to surgery, the patient was living in [place] taking care of her mother.  She has now been in  [place] and they have moved her mother to [place].  We discussed the importance of getting her treatment at 1 facility if at all possible.  Her preference is to receive initial treatment here in [place] rather than [place].  I discussed the assessment and treatment plan with the patient. The patient was provided with an opportunity to ask questions and all were answered. The patient agreed with the plan and demonstrated an understanding of the instructions.   The patient was advised to call back or see an in-person evaluation if the symptoms worsen or if the condition fails to improve as anticipated.   38 minutes of total time was spent for this patient encounter, including preparation, over-the-phone counseling with the patient and coordination of care, and documentation of the encounter.   Jeral Pinch, MD  Division of Gynecologic Oncology  Department of Obstetrics and Gynecology  [facility] : [facility]

## 2020-12-08 ENCOUNTER — Ambulatory Visit: Payer: Medicare Other | Admitting: Oncology

## 2020-12-09 ENCOUNTER — Telehealth: Payer: Self-pay | Admitting: Oncology

## 2020-12-09 LAB — SURGICAL PATHOLOGY

## 2020-12-09 NOTE — Telephone Encounter (Signed)
[name] [name] said she is starting to feel better.  She continues to have diarrhea 3-4 times a day but said it has more "substance to it and not as much." She is not taking Imodium or any laxatives.  She is eating small meals more often and drinking water.  She said she actually wanted to eat today.  She denies having nausea/vomiting, urinary issues, vaginal bleeding or pain.  She said her incision is doing well without drainage or redness.  She did ask what her stage is and advised her that I will get back to her.  Called her back and advised her that she has stage III metastatic low-grade serous carcinoma of gynecologic origin.  She verbalized understanding.  Advised her to call with any questions or needs.

## 2020-12-15 ENCOUNTER — Other Ambulatory Visit: Payer: Self-pay

## 2020-12-15 ENCOUNTER — Encounter: Payer: Self-pay | Admitting: Hematology and Oncology

## 2020-12-15 ENCOUNTER — Inpatient Hospital Stay: Payer: Medicare Other | Admitting: Hematology and Oncology

## 2020-12-15 ENCOUNTER — Other Ambulatory Visit (HOSPITAL_COMMUNITY): Payer: Self-pay

## 2020-12-15 ENCOUNTER — Inpatient Hospital Stay: Payer: Medicare Other

## 2020-12-15 VITALS — BP 131/67 | HR 79 | Temp 97.8°F | Resp 18 | Ht 63.0 in | Wt 132.2 lb

## 2020-12-15 DIAGNOSIS — G894 Chronic pain syndrome: Secondary | ICD-10-CM | POA: Diagnosis not present

## 2020-12-15 DIAGNOSIS — R197 Diarrhea, unspecified: Secondary | ICD-10-CM | POA: Insufficient documentation

## 2020-12-15 DIAGNOSIS — Z7901 Long term (current) use of anticoagulants: Secondary | ICD-10-CM | POA: Diagnosis not present

## 2020-12-15 DIAGNOSIS — Z7952 Long term (current) use of systemic steroids: Secondary | ICD-10-CM | POA: Diagnosis not present

## 2020-12-15 DIAGNOSIS — R634 Abnormal weight loss: Secondary | ICD-10-CM

## 2020-12-15 DIAGNOSIS — Z79899 Other long term (current) drug therapy: Secondary | ICD-10-CM | POA: Diagnosis not present

## 2020-12-15 DIAGNOSIS — C481 Malignant neoplasm of specified parts of peritoneum: Secondary | ICD-10-CM | POA: Insufficient documentation

## 2020-12-15 DIAGNOSIS — C482 Malignant neoplasm of peritoneum, unspecified: Secondary | ICD-10-CM | POA: Diagnosis not present

## 2020-12-15 MED ORDER — DEXAMETHASONE 4 MG PO TABS
ORAL_TABLET | ORAL | 6 refills | Status: DC
  Filled 2020-12-15: qty 16, 84d supply, fill #0

## 2020-12-15 MED ORDER — PROCHLORPERAZINE MALEATE 10 MG PO TABS
10.0000 mg | ORAL_TABLET | Freq: Four times a day (QID) | ORAL | 1 refills | Status: DC | PRN
  Filled 2020-12-15: qty 30, 8d supply, fill #0

## 2020-12-15 MED ORDER — MIRTAZAPINE 15 MG PO TABS
15.0000 mg | ORAL_TABLET | Freq: Every day | ORAL | 3 refills | Status: DC
  Filled 2020-12-15: qty 30, 30d supply, fill #0

## 2020-12-15 MED ORDER — LIDOCAINE-PRILOCAINE 2.5-2.5 % EX CREA
1.0000 "application " | TOPICAL_CREAM | Freq: Every day | CUTANEOUS | 11 refills | Status: AC
  Filled 2020-12-15: qty 30, 30d supply, fill #0

## 2020-12-15 MED ORDER — ONDANSETRON HCL 8 MG PO TABS
8.0000 mg | ORAL_TABLET | Freq: Two times a day (BID) | ORAL | 1 refills | Status: DC | PRN
  Filled 2020-12-15: qty 30, 15d supply, fill #0

## 2020-12-15 NOTE — Assessment & Plan Note (Signed)
She has frequent loose stool We discussed importance of hydration as tolerated

## 2020-12-15 NOTE — Assessment & Plan Note (Signed)
She have severe chronic pain likely secondary to nerve root compression from her disease The patient tolerate pain medicine poorly I recommend trial of acetaminophen along with antiacid medicine to prevent heartburn sensation that she experiences with taking acetaminophen

## 2020-12-15 NOTE — Progress Notes (Signed)
[place] progress notes  Patient Care Team: Algis Greenhouse, MD as PCP - General (Unknown Physician Specialty) Lafonda Mosses, MD as Consulting Physician (Gynecologic Oncology) Heath Lark, MD as Consulting Physician (Hematology and Oncology)  CHIEF COMPLAINTS/PURPOSE OF VISIT:  Primary peritoneal cancer, low-grade serous, for adjuvant treatment  HISTORY OF PRESENTING ILLNESS:  [name] [name] 65 y.o. female was transferred to my care per patient request She was seen in [place] because she was taking care of her mother at the time Due to her recent cancer diagnosis, she prefers to treatment here She is here accompanied by her husband, [name] [name]  She has not been feeling well since Mother's Day of 2020 when she fell Over the last 2 years, she has gradual changes in bowel habits, loss of appetite, erratic bowel habits with postprandial diarrhea and chronic, severe back pain Her pain is typically on the left upper quadrant She also had pain in her stomach especially when she tries to take pain medicine She had recent severe heartburn after surgery but is gradually improving Incisional surgical pain is not too bad At the heaviest, she weighed 165 pounds but prior to surgery, she weighs around 150 pounds She has very poor oral intake recently Her husband is very concerned about her wellbeing She has early satiety She could not tolerate tramadol, narcotics or Lyrica  Due to her severe back pain, she had imaging study including CT scan and PET CT scan She had CT-guided biopsy which showed low-grade serous carcinoma She subsequently underwent surgery is now here to discuss the role of adjuvant chemotherapy  I reviewed the patient's records extensive and collaborated the history with the patient. Summary of her history is as follows: Oncology History Overview Note  Low grade serous   Extraovarian primary peritoneal carcinoma ([facility])  09/29/2020 Imaging   MRI  lumbar spine The dominant finding in this case is that of massive retroperitoneal lymphadenopathy, likely related to either metastatic disease or lymphoma.   Chronic degenerative and postoperative findings of the spine as outlined in detail above   10/05/2020 Imaging   IMPRESSION outside CT 1. Large calcified retroperitoneal lymph nodes highly concerning for metastatic disease, likely related to primary carcinoma of the colon or lymphoma. Clinical correlation is recommended. 2. No bowel obstruction. Normal appendix. 3. Stable right hepatic dome hemangioma.   10/30/2020 PET scan   1. The densely calcified retroperitoneal adenopathy is highly hypermetabolic with maximum SUV of 23.4 (Deauville 5) there is also Deauville 3 activity in a small right axillary lymph node. Given the lack of an obvious primary outside of the retroperitoneum, appearance tends to favor lymphoma or Castleman's disease. Tissue diagnosis suggested. 2. Other imaging findings of potential clinical significance: Chronic left maxillary sinusitis. Aortic Atherosclerosis (ICD10-I70.0). Coronary atherosclerosis.   11/10/2020 Procedure   CT guided biopsy of retroperitoneal adenopathy   11/10/2020 Pathology Results   biopsy results which favor a low-grade serous carcinoma of gyn origin. This is a somewhat atypical presentation, but my suspicion is that it is primary peritoneal carcinoma   11/16/2020 Tumor Marker   Patient's tumor was tested for the following markers: CA-125. Results of the tumor marker test revealed 96.4.   12/01/2020 Pathology Results   SURGICAL PATHOLOGY  CASE: [id number]  PATIENT: [name] [name]  Surgical Pathology Report   Clinical History: Metastatic low grade serous carcinoma of gyn origin  ([facility])    FINAL MICROSCOPIC DIAGNOSIS:   A. UTERUS, CERVIX, BILATERAL FALLOPIAN TUBES AND OVARIES:  Uterus:  -  Inactive endometrium  -  Leiomyoma (1.1 cm; largest)  -  Calcified nodule of the broad ligament  with psammoma bodies (right)  -  No hyperplasia or malignancy identified   Cervix:  -  Benign cervix  -  No dysplasia or malignancy identified   Ovaries, bilateral:  -  Serous cyst adenofibroma with microscopic focus of borderline change  (right)    Fallopian tubes, bilateral:  -  Benign fallopian tube with paratubal cyst (right)  -  No malignancy identified   B. OMENTUM, OMENTECTOMY:  -  No carcinoma identified   C. PERIAORTIC TUMOR, EXCISION:  -  Serous carcinoma, low-grade, with psammoma bodies  -  See comment below   COMMENT:   C.  At the request of the treating clinician, immunohistochemistry was repeated.  The neoplastic cells are positive for ER, cytokeratin 7, cytokeratin 5/6, p53, PAX8 and WT1 but negative for cytokeratin 20,  TTF-1, PR and thyroglobulin.  The morphology and immunophenotype are consistent with a primary peritoneal low-grade serous carcinoma (see also, [id number]).   ONCOLOGY TABLE:   OVARY or FALLOPIAN TUBE or PRIMARY PERITONEUM: Resection   Procedure: Total hysterectomy and bilateral salpingo-oophorectomy with omentectomy and periaortic tumor excision  Specimen Integrity: Intact  Tumor Site: Primary peritoneum (see comment)  Tumor Size: See comment  Histologic Type: Low-grade serous carcinoma  Histologic Grade: Low-grade  Ovarian Surface Involvement: Not identified  Fallopian Tube Surface Involvement: Not identified  Implants (required for advanced stage serous/seromucinous borderline  tumors only): Not applicable  Other Tissue/ Organ Involvement: Periaortic tumor  Largest Extrapelvic Peritoneal Focus: 5.5 cm (aggregate measurement)  Peritoneal/Ascitic Fluid Involvement: Not applicable  Chemotherapy Response Score (CRS): Not applicable, no known presurgical therapy  Regional Lymph Nodes: Not applicable (no lymph nodes submitted or found)  Distant Metastasis:       Distant Site(s) Involved: Not applicable  Pathologic Stage Classification  (pTNM, AJCC 8th Edition): pT3, pN not assigned  Ancillary Studies: Can be performed upon request  Representative Tumor Block: C1  Comment(s): The only focus of low-grade serous carcinoma identified in the resection specimen is the tissue submitted as periaortic tumor (part C).  Clinically, this was favored to represent matted lymph nodes; however, there is no evidence of nodal tissue in the examined material. This case was discussed with Dr. Berline Lopes and given the extent of the matted lymph nodes, this is being staged as a pT3.     12/01/2020 Surgery   Pre-operative Diagnosis: low grade serous carcinoma with significant retroperitoneal adenopathy, no other obvious imaging evidence of disease   Post-operative Diagnosis: same, significantly adherent presacral and para-aortic lymphadenopathy   Operation: Robotic-assisted laparoscopic total hysterectomy with bilateral salpingo-oophorectomy, laparotomy, infra-colic omentectomy, portion of necrotic tumor-ridden retroperitoneal lymph nodes, oversew of sigmoid serosa   Surgeon: Jeral Pinch MD   Assistant Surgeon: Lahoma Crocker MD (an MD assistant was necessary for tissue manipulation, management of robotic instrumentation, retraction and positioning due to the complexity of the case and hospital policies).    Intra-operative consultant:  Everitt Amber MD    Operative Findings: On EUA, small mobile uterus. ON intra-abdominal entry, normal liver and diaphragm. One filmy adhesion noted between the right liver lobe and the anterior abdominal wall. The spleen was somewhat lateral and appeared mildly enlarged. There were some adhesions of the infragastric omentum to the anterior abdominal wall in the midline. The omentum was normal appearing without obvious evidence of tumor. Small and large bowel were normal appearing, appendix normal. Uterus 6cm and normal in  appearance. Somewhat atrophic bilateral adnexa. There were filmy adhesions between each adnexa  and the pelvic sidewall. Some filmy adhesions were also noted between the posterior uterus and rectum. Approximately 0.5-1cm white nodule within the broad ligament, lateral to the right cornua, c/w possible fibroma vs fibroid. No pelvic adenopathy. Significantly enlarged, necrotic, and tumor-filled lymph node conglomeration was noted starting within the presacral space (adherent to the sacral promontory) and extending superiorly along the great vessels to the level of the renal vessel (suspected). Given adherence of the involved lymph nodes, I could not visualize this anatomy, I could long palpate along each lateral aspect of the aorta. There was no identifiable plane between matted lymph nodes and underlying aorta or IVC. Given burden of disease and my concern that resection was not feasible, I asked my senior partner (Dr. Andrey Farmer) to evaluate intra-op. She agreed that moving forward with attempt at debulking involved lymph nodes was likely not feasible and an attempt at this time would be unsafe.   There was no obvious peritoneal disease and no clear involvement of gyn organs. Despite prior biopsy, findings at surgery remain concerning for a non-gynecologic process. If this is confirmed to be low-grade serous carcinoma of gyn origin, this was an R2 resection.    12/15/2020 Initial Diagnosis   Extraovarian primary peritoneal carcinoma (HCC)   12/15/2020 Cancer Staging   Staging form: Ovary, AJCC 7th Edition - Pathologic stage from 12/15/2020: Stage IIIC (T3c, N0, cM0) - Signed by Artis Delay, MD on 12/15/2020  Staged by: Managing physician  Stage prefix: Initial diagnosis    01/08/2021 -  Chemotherapy    Patient is on Treatment Plan: OVARIAN CARBOPLATIN (AUC 6) / PACLITAXEL (175) Q21D X 6 CYCLES         MEDICAL HISTORY:  Past Medical History:  Diagnosis Date   Arthritis    Chronic pain    Contact lens/glasses fitting    wears contacts or glasses   Depression    Fibromyalgia    Melanoma  (HCC)    Peritoneal carcinoma (HCC)    PONV (postoperative nausea and vomiting)    prolonged sedation very hard to wake    SURGICAL HISTORY: Past Surgical History:  Procedure Laterality Date   BACK SURGERY     lumb lam   BREAST BIOPSY Right    2021   BREAST BIOPSY Left    BREAST ENHANCEMENT SURGERY  05/30/1980   implants    BREAST IMPLANT EXCHANGE  05/30/2009   CARPAL TUNNEL RELEASE  05/30/2001   rt and lt   CARPOMETACARPEL SUSPENSION PLASTY  06/22/2012   Procedure: CARPOMETACARPEL (CMC) SUSPENSION PLASTY;  Surgeon: Harvie Junior, MD;  Location: [facility];  Service: Orthopedics;  Laterality: Left;  burton's interposition arthroplasty left thumb    CATARACT EXTRACTION W/ INTRAOCULAR LENS  IMPLANT, BILATERAL     COLONOSCOPY     x2   DIAGNOSTIC LAPAROSCOPY  05/30/2000   expl lap   DILATION AND CURETTAGE OF UTERUS  05/30/2001   ablation   ELBOW SURGERY  05/31/1999   decompression rt   EPIDURAL BLOCK INJECTION     multiple   LYMPH NODE DISSECTION N/A 12/01/2020   Procedure: LYMPH NODE DISSECTION;  Surgeon: Carver Fila, MD;  Location: [facility];  Service: Gynecology;  Laterality: N/A;   SKIN CANCER EXCISION     melanoma, from abdomen   TONSILLECTOMY      SOCIAL HISTORY: Social History   Socioeconomic History   Marital status: Married  Spouse name: Not on file   Number of children: Not on file   Years of education: Not on file   Highest education level: Not on file  Occupational History   Not on file  Tobacco Use   Smoking status: Never   Smokeless tobacco: Never  Vaping Use   Vaping Use: Never used  Substance and Sexual Activity   Alcohol use: Yes    Comment: rare   Drug use: No   Sexual activity: Yes    Partners: Male    Birth control/protection: Post-menopausal  Other Topics Concern   Not on file  Social History Narrative   Not on file   Social Determinants of Health   Financial Resource Strain: Not on file  Food Insecurity: Not  on file  Transportation Needs: Not on file  Physical Activity: Not on file  Stress: Not on file  Social Connections: Not on file  Intimate Partner Violence: Not on file    FAMILY HISTORY: Family History  Problem Relation Age of Onset   Lung cancer Father    Colon cancer Neg Hx    Breast cancer Neg Hx    Ovarian cancer Neg Hx    Endometrial cancer Neg Hx    Pancreatic cancer Neg Hx    Prostate cancer Neg Hx     ALLERGIES:  is allergic to ciprofloxacin-ciproflox hcl er, amoxicillin, bupropion, codeine, duloxetine, methylphenidate, other, and latex.  MEDICATIONS:  Current Outpatient Medications  Medication Sig Dispense Refill   dexamethasone (DECADRON) 4 MG tablet Take 2 tablets the night before and 2 tablets the morning of chemotherapy, every 3 weeks for 6 cycles 36 tablet 6   lidocaine-prilocaine (EMLA) cream Apply to affected area daily as instructed. 30 g 11   mirtazapine (REMERON) 15 MG tablet Take 1 tablet (15 mg total) by mouth at bedtime. 30 tablet 3   alum & mag hydroxide-simeth (MAALOX/MYLANTA) 200-200-20 MG/5ML suspension Take 30 mLs by mouth every 4 (four) hours as needed for up to 6 doses for indigestion or heartburn. 180 mL 0   Apoaequorin (PREVAGEN PO) Take 1 tablet by mouth in the morning.     Cranberry-Vitamin C (CRANBERRY CONCENTRATE/VITAMINC PO) Take 2 tablets by mouth in the morning.     enoxaparin (LOVENOX) 40 MG/0.4ML injection Inject 0.4 mLs (40 mg total) into the skin daily for 26 doses. 10.4 mL 0   famotidine (PEPCID) 20 MG tablet Take 1 tablet (20 mg total) by mouth daily. Can take a second dose as needed daily 40 tablet 3   FLUoxetine (PROZAC) 20 MG tablet Take 10 mg by mouth every evening.     Omega-3 Fatty Acids (FISH OIL CONCENTRATE PO) Take 1 capsule by mouth 3 (three) times a week.     ondansetron (ZOFRAN) 8 MG tablet Take 1 tablet by mouth 2 times daily as needed for refractory nausea / vomiting. Start on day 3 after carboplatin chemo. 30 tablet 1    Probiotic Product (PROBIOTIC PO) Take 1 capsule by mouth in the morning. OLLY [employer] [place] [employer] Probiotic Capsules     prochlorperazine (COMPAZINE) 10 MG tablet Take 1 tablet (10 mg total) by mouth every 6 (six) hours as needed (Nausea or vomiting). 30 tablet 1   senna-docusate (SENOKOT-S) 8.6-50 MG tablet Take 2 tablets by mouth at bedtime. For AFTER surgery, do not take if having diarrhea 30 tablet 0   traMADol (ULTRAM) 50 MG tablet Take 1 tablet (50 mg total) by mouth every 6 (six) hours as needed  for severe pain. For AFTER surgery pain only, do not take and drive 10 tablet 0   No current facility-administered medications for this visit.    REVIEW OF SYSTEMS:   Constitutional: Denies fevers, chills or abnormal night sweats Eyes: Denies blurriness of vision, double vision or watery eyes Ears, nose, mouth, throat, and face: Denies mucositis or sore throat Respiratory: Denies cough, dyspnea or wheezes Cardiovascular: Denies palpitation, chest discomfort or lower extremity swelling Skin: Denies abnormal skin rashes Lymphatics: Denies new lymphadenopathy or easy bruising Behavioral/Psych: Mood is stable, no new changes  All other systems were reviewed with the patient and are negative.  PHYSICAL EXAMINATION: ECOG PERFORMANCE STATUS: 2 - Symptomatic, <50% confined to bed  Vitals:   12/15/20 1349  BP: 131/67  Pulse: 79  Resp: 18  Temp: 97.8 F (36.6 C)  SpO2: 100%   Filed Weights   12/15/20 1349  Weight: 132 lb 3.2 oz (60 kg)    GENERAL:alert, no distress and comfortable.  She looks frail SKIN: skin color, texture, turgor are normal, no rashes or significant lesions EYES: normal, conjunctiva are pink and non-injected, sclera clear OROPHARYNX:no exudate, normal lips, buccal mucosa, and tongue  NECK: supple, thyroid normal size, non-tender, without nodularity LYMPH:  no palpable lymphadenopathy in the cervical, axillary or inguinal LUNGS: clear to auscultation and percussion with  normal breathing effort HEART: regular rate & rhythm and no murmurs without lower extremity edema ABDOMEN:abdomen soft, non-tender and normal bowel sounds.  Well-healed surgical scars noted Musculoskeletal:no cyanosis of digits and no clubbing  PSYCH: alert & oriented x 3 with fluent speech NEURO: no focal motor/sensory deficits  LABORATORY DATA:  I have reviewed the data as listed Lab Results  Component Value Date   WBC 8.1 12/03/2020   HGB 12.9 12/03/2020   HCT 38.8 12/03/2020   MCV 93.0 12/03/2020   PLT 197 12/03/2020   Recent Labs    11/27/20 1129 12/02/20 0315 12/03/20 0800  NA 139 133* 138  K 3.9 4.3 3.6  CL 105 98 104  CO2 $Re'28 24 29  'VbX$ GLUCOSE 102* 169* 125*  BUN $Re'12 10 10  'fcj$ CREATININE 0.77 0.90 0.84  CALCIUM 9.6 8.8* 8.6*  GFRNONAA >60 >60 >60    RADIOGRAPHIC STUDIES: I have reviewed her imaging study I have personally reviewed the radiological images as listed and agreed with the findings in the report.   ASSESSMENT & PLAN:  Extraovarian primary peritoneal carcinoma ([place]) The patient has an unusual presentation of a low-grade serous carcinoma causing significant GI issues and pain She is slowly improving and recovering from her surgery We discussed the role of treatment She had residual disease at the time of surgery We discussed the role of chemotherapy  We reviewed the NCCN guidelines We discussed the role of chemotherapy. The intent is of curative intent.  We discussed some of the risks, benefits, side-effects of carboplatin & Taxol. Treatment is intravenous, every 3 weeks x 6 cycles  Some of the short term side-effects included, though not limited to, including weight loss, life threatening infections, risk of allergic reactions, need for transfusions of blood products, nausea, vomiting, change in bowel habits, loss of hair, admission to hospital for various reasons, and risks of death.   Long term side-effects are also discussed including risks of  infertility, permanent damage to nerve function, hearing loss, chronic fatigue, kidney damage with possibility needing hemodialysis, and rare secondary malignancy including bone marrow disorders.  The patient is aware that the response rates discussed earlier is  not guaranteed.  After a long discussion, patient made an informed decision to proceed with the prescribed plan of care.   Patient education material was dispensed. We discussed premedication with dexamethasone before chemotherapy. I recommend port placement and chemo education class We discussed the importance of aggressive supportive care prior to starting treatment I will see her again next month for further follow-up  Chronic pain syndrome She have severe chronic pain likely secondary to nerve root compression from her disease The patient tolerate pain medicine poorly I recommend trial of acetaminophen along with antiacid medicine to prevent heartburn sensation that she experiences with taking acetaminophen  Weight loss, abnormal She has severe, profound weight loss over the last few weeks We discussed importance of frequent small meals and high-protein diet She sleep poorly I recommend a trial of Remeron  Diarrhea She has frequent loose stool We discussed importance of hydration as tolerated  Orders Placed This Encounter  Procedures   IR IMAGING GUIDED PORT INSERTION    Standing Status:   Future    Standing Expiration Date:   12/15/2021    Order Specific Question:   Reason for Exam (SYMPTOM  OR DIAGNOSIS REQUIRED)    Answer:   need port to start chemo 1st week of chemo    Order Specific Question:   Preferred Imaging Location?    Answer:   [facility]   CBC with Differential ([place] Only)    Standing Status:   Standing    Number of Occurrences:   20    Standing Expiration Date:   12/15/2021   CMP ([place] only)    Standing Status:   Standing    Number of Occurrences:   20    Standing Expiration  Date:   12/15/2021   CA 125    Standing Status:   Standing    Number of Occurrences:   11    Standing Expiration Date:   12/15/2021    All questions were answered. The patient knows to call the clinic with any problems, questions or concerns. The total time spent in the appointment was 90 minutes encounter with patients including review of chart and various tests results, discussions about plan of care and coordination of care plan   Heath Lark, MD 12/15/2020 4:34 PM

## 2020-12-15 NOTE — Progress Notes (Signed)
START ON PATHWAY REGIMEN - Ovarian     A cycle is every 21 days:     Paclitaxel      Carboplatin   **Always confirm dose/schedule in your pharmacy ordering system**  Patient Characteristics: Postoperative without Neoadjuvant Therapy (Pathologic Staging), Newly Diagnosed, Adjuvant Therapy, Stage IIIC Suboptimal and Stage IV BRCA Mutation Status: Awaiting Test Results Therapeutic Status: Postoperative without Neoadjuvant Therapy (Pathologic Staging) AJCC 8 Stage Grouping: IIIC AJCC M Category: cM0 AJCC T Category: pT3c AJCC N Category: pN0  Intent of Therapy: Curative Intent, Discussed with Patient

## 2020-12-15 NOTE — Assessment & Plan Note (Signed)
She has severe, profound weight loss over the last few weeks We discussed importance of frequent small meals and high-protein diet She sleep poorly I recommend a trial of Remeron

## 2020-12-15 NOTE — Assessment & Plan Note (Signed)
The patient has an unusual presentation of a low-grade serous carcinoma causing significant GI issues and pain She is slowly improving and recovering from her surgery We discussed the role of treatment She had residual disease at the time of surgery We discussed the role of chemotherapy  We reviewed the NCCN guidelines We discussed the role of chemotherapy. The intent is of curative intent.  We discussed some of the risks, benefits, side-effects of carboplatin & Taxol. Treatment is intravenous, every 3 weeks x 6 cycles  Some of the short term side-effects included, though not limited to, including weight loss, life threatening infections, risk of allergic reactions, need for transfusions of blood products, nausea, vomiting, change in bowel habits, loss of hair, admission to hospital for various reasons, and risks of death.   Long term side-effects are also discussed including risks of infertility, permanent damage to nerve function, hearing loss, chronic fatigue, kidney damage with possibility needing hemodialysis, and rare secondary malignancy including bone marrow disorders.  The patient is aware that the response rates discussed earlier is not guaranteed.  After a long discussion, patient made an informed decision to proceed with the prescribed plan of care.   Patient education material was dispensed. We discussed premedication with dexamethasone before chemotherapy. I recommend port placement and chemo education class We discussed the importance of aggressive supportive care prior to starting treatment I will see her again next month for further follow-up

## 2020-12-21 ENCOUNTER — Telehealth: Payer: Self-pay | Admitting: Oncology

## 2020-12-21 ENCOUNTER — Encounter: Payer: Self-pay | Admitting: Oncology

## 2020-12-21 ENCOUNTER — Other Ambulatory Visit: Payer: Self-pay | Admitting: Oncology

## 2020-12-21 NOTE — Progress Notes (Signed)
Gynecologic Oncology Multi-Disciplinary Disposition Conference Note  Date of the Conference: 12/21/2020  Patient Name: [name] [name]  Referring Provider: Dr. Lavera Guise Primary GYN Oncologist: Dr. Berline Lopes  Stage/Disposition:  Stage III primary peritoneal low-grade serous carcinoma. Disposition is to chemotherapy.  Also genetic counseling referral and next gen sequencing to be requested.   This Multidisciplinary conference took place involving physicians from [place], [place], Radiation Oncology, Pathology, Radiology along with the Gynecologic Oncology Nurse Practitioner and RN.  Comprehensive assessment of the patient's malignancy, staging, need for surgery, chemotherapy, radiation therapy, and need for further testing were reviewed. Supportive measures, both inpatient and following discharge were also discussed. The recommended plan of care is documented. Greater than 35 minutes were spent correlating and coordinating this patient's care.

## 2020-12-21 NOTE — Progress Notes (Signed)
Requested [facility] next gen sequencing on accession [id number] with [facility] via email.

## 2020-12-21 NOTE — Telephone Encounter (Signed)
Called [name] and rescheduled her post op apt with Dr. Berline Lopes to Friday, 7/29.

## 2020-12-22 ENCOUNTER — Encounter: Payer: Medicare Other | Admitting: Gynecologic Oncology

## 2020-12-25 ENCOUNTER — Inpatient Hospital Stay (HOSPITAL_BASED_OUTPATIENT_CLINIC_OR_DEPARTMENT_OTHER): Payer: Medicare Other | Admitting: Gynecologic Oncology

## 2020-12-25 ENCOUNTER — Encounter: Payer: Self-pay | Admitting: Gynecologic Oncology

## 2020-12-25 ENCOUNTER — Other Ambulatory Visit: Payer: Self-pay

## 2020-12-25 VITALS — BP 142/65 | HR 78 | Temp 97.8°F | Resp 18 | Ht 63.0 in | Wt 131.0 lb

## 2020-12-25 DIAGNOSIS — R197 Diarrhea, unspecified: Secondary | ICD-10-CM

## 2020-12-25 DIAGNOSIS — Z90710 Acquired absence of both cervix and uterus: Secondary | ICD-10-CM

## 2020-12-25 DIAGNOSIS — Z90722 Acquired absence of ovaries, bilateral: Secondary | ICD-10-CM

## 2020-12-25 DIAGNOSIS — C481 Malignant neoplasm of specified parts of peritoneum: Secondary | ICD-10-CM

## 2020-12-25 DIAGNOSIS — Z7189 Other specified counseling: Secondary | ICD-10-CM

## 2020-12-25 NOTE — Patient Instructions (Signed)
Its good to see you today.  Your incisions are healing well. Remember, no lifting more than 10 pounds until 6 weeks after surgery and nothing in the vagina for at least 8.  You can try adding some more fiber to your diet to see if that helps with your diarrhea.  I will be in touch with Dr. Alvy Bimler about your progress on chemotherapy.

## 2020-12-25 NOTE — Progress Notes (Signed)
Gynecologic Oncology Return Clinic Visit  12/25/2020  Reason for Visit: Postoperative follow-up, treatment discussion  Treatment History: Oncology History Overview Note  Low grade serous   Extraovarian primary peritoneal carcinoma ([place])  09/29/2020 Imaging   MRI lumbar spine The dominant finding in this case is that of massive retroperitoneal lymphadenopathy, likely related to either metastatic disease or lymphoma.   Chronic degenerative and postoperative findings of the spine as outlined in detail above   10/05/2020 Imaging   IMPRESSION outside CT 1. Large calcified retroperitoneal lymph nodes highly concerning for metastatic disease, likely related to primary carcinoma of the colon or lymphoma. Clinical correlation is recommended. 2. No bowel obstruction. Normal appendix. 3. Stable right hepatic dome hemangioma.   10/30/2020 PET scan   1. The densely calcified retroperitoneal adenopathy is highly hypermetabolic with maximum SUV of 23.4 (Deauville 5) there is also Deauville 3 activity in a small right axillary lymph node. Given the lack of an obvious primary outside of the retroperitoneum, appearance tends to favor lymphoma or Castleman's disease. Tissue diagnosis suggested. 2. Other imaging findings of potential clinical significance: Chronic left maxillary sinusitis. Aortic Atherosclerosis (ICD10-I70.0). Coronary atherosclerosis.   11/10/2020 Procedure   CT guided biopsy of retroperitoneal adenopathy   11/10/2020 Pathology Results   biopsy results which favor a low-grade serous carcinoma of gyn origin. This is a somewhat atypical presentation, but my suspicion is that it is primary peritoneal carcinoma   11/16/2020 Tumor Marker   Patient's tumor was tested for the following markers: CA-125. Results of the tumor marker test revealed 96.4.   12/01/2020 Pathology Results   SURGICAL PATHOLOGY  CASE: [id number]  PATIENT: [name] [name]  Surgical Pathology Report   Clinical History:  Metastatic low grade serous carcinoma of gyn origin  ([facility])    FINAL MICROSCOPIC DIAGNOSIS:   A. UTERUS, CERVIX, BILATERAL FALLOPIAN TUBES AND OVARIES:  Uterus:  -  Inactive endometrium  -  Leiomyoma (1.1 cm; largest)  -  Calcified nodule of the broad ligament with psammoma bodies (right)  -  No hyperplasia or malignancy identified   Cervix:  -  Benign cervix  -  No dysplasia or malignancy identified   Ovaries, bilateral:  -  Serous cyst adenofibroma with microscopic focus of borderline change  (right)    Fallopian tubes, bilateral:  -  Benign fallopian tube with paratubal cyst (right)  -  No malignancy identified   B. OMENTUM, OMENTECTOMY:  -  No carcinoma identified   C. PERIAORTIC TUMOR, EXCISION:  -  Serous carcinoma, low-grade, with psammoma bodies  -  See comment below   COMMENT:   C.  At the request of the treating clinician, immunohistochemistry was repeated.  The neoplastic cells are positive for ER, cytokeratin 7, cytokeratin 5/6, p53, PAX8 and WT1 but negative for cytokeratin 20,  TTF-1, PR and thyroglobulin.  The morphology and immunophenotype are consistent with a primary peritoneal low-grade serous carcinoma (see also, [id number]).   ONCOLOGY TABLE:   OVARY or FALLOPIAN TUBE or PRIMARY PERITONEUM: Resection   Procedure: Total hysterectomy and bilateral salpingo-oophorectomy with omentectomy and periaortic tumor excision  Specimen Integrity: Intact  Tumor Site: Primary peritoneum (see comment)  Tumor Size: See comment  Histologic Type: Low-grade serous carcinoma  Histologic Grade: Low-grade  Ovarian Surface Involvement: Not identified  Fallopian Tube Surface Involvement: Not identified  Implants (required for advanced stage serous/seromucinous borderline  tumors only): Not applicable  Other Tissue/ Organ Involvement: Periaortic tumor  Largest Extrapelvic Peritoneal Focus: 5.5 cm (aggregate measurement)  Peritoneal/Ascitic Fluid  Involvement: Not  applicable  Chemotherapy Response Score (CRS): Not applicable, no known presurgical therapy  Regional Lymph Nodes: Not applicable (no lymph nodes submitted or found)  Distant Metastasis:       Distant Site(s) Involved: Not applicable  Pathologic Stage Classification (pTNM, AJCC 8th Edition): pT3, pN not assigned  Ancillary Studies: Can be performed upon request  Representative Tumor Block: C1  Comment(s): The only focus of low-grade serous carcinoma identified in the resection specimen is the tissue submitted as periaortic tumor (part C).  Clinically, this was favored to represent matted lymph nodes; however, there is no evidence of nodal tissue in the examined material. This case was discussed with Dr. Berline Lopes and given the extent of the matted lymph nodes, this is being staged as a pT3.     12/01/2020 Surgery   Pre-operative Diagnosis: low grade serous carcinoma with significant retroperitoneal adenopathy, no other obvious imaging evidence of disease   Post-operative Diagnosis: same, significantly adherent presacral and para-aortic lymphadenopathy   Operation: Robotic-assisted laparoscopic total hysterectomy with bilateral salpingo-oophorectomy, laparotomy, infra-colic omentectomy, portion of necrotic tumor-ridden retroperitoneal lymph nodes, oversew of sigmoid serosa   Surgeon: Jeral Pinch MD   Assistant Surgeon: Lahoma Crocker MD (an MD assistant was necessary for tissue manipulation, management of robotic instrumentation, retraction and positioning due to the complexity of the case and hospital policies).    Intra-operative consultant:  Everitt Amber MD    Operative Findings: On EUA, small mobile uterus. ON intra-abdominal entry, normal liver and diaphragm. One filmy adhesion noted between the right liver lobe and the anterior abdominal wall. The spleen was somewhat lateral and appeared mildly enlarged. There were some adhesions of the infragastric omentum to the anterior abdominal  wall in the midline. The omentum was normal appearing without obvious evidence of tumor. Small and large bowel were normal appearing, appendix normal. Uterus 6cm and normal in appearance. Somewhat atrophic bilateral adnexa. There were filmy adhesions between each adnexa and the pelvic sidewall. Some filmy adhesions were also noted between the posterior uterus and rectum. Approximately 0.5-1cm white nodule within the broad ligament, lateral to the right cornua, c/w possible fibroma vs fibroid. No pelvic adenopathy. Significantly enlarged, necrotic, and tumor-filled lymph node conglomeration was noted starting within the presacral space (adherent to the sacral promontory) and extending superiorly along the great vessels to the level of the renal vessel (suspected). Given adherence of the involved lymph nodes, I could not visualize this anatomy, I could long palpate along each lateral aspect of the aorta. There was no identifiable plane between matted lymph nodes and underlying aorta or IVC. Given burden of disease and my concern that resection was not feasible, I asked my senior partner (Dr. Denman George) to evaluate intra-op. She agreed that moving forward with attempt at debulking involved lymph nodes was likely not feasible and an attempt at this time would be unsafe.   There was no obvious peritoneal disease and no clear involvement of gyn organs. Despite prior biopsy, findings at surgery remain concerning for a non-gynecologic process. If this is confirmed to be low-grade serous carcinoma of gyn origin, this was an R2 resection.    12/15/2020 Initial Diagnosis   Extraovarian primary peritoneal carcinoma ([place])   12/15/2020 Cancer Staging   Staging form: [place], AJCC 7th Edition - Pathologic stage from 12/15/2020: Stage IIIC (T3c, N0, cM0) - Signed by Heath Lark, MD on 12/15/2020  Staged by: Managing physician  Stage prefix: Initial diagnosis    01/08/2021 -  Chemotherapy  Patient is on Treatment Plan:  OVARIAN CARBOPLATIN (AUC 6) / PACLITAXEL (175) Q21D X 6 CYCLES         Interval History: The patient presents today for follow-up after recent surgery on 7/5.  She has had some improvement since our telephone conversation but is continuing to struggle with GI symptoms.  She now believes that her GI symptoms are similar to prior to surgery or mildly worse.  She continues to have almost exclusively diarrhea although has noted more recent see a little bit of consistency to her stool.  Some days she has no bowel function and other days she has multiple bowel movements.  For instance, she had 3 bowel movements yesterday.  Since her visit with Dr. Alvy Bimler, the patient is working on eating and drinking more protein.  She is getting close to her protein daily goal.  She thinks mostly she is in the range of 80-90 g of protein a day.  She notes continued improvement of her incisional pain.  When she eats now, she gets bloated and has abdominal pain.  This seems to cause her incision to also hurt.  She denies any fevers or chills.  She denies any vaginal bleeding or discharge.  She reports normal bowel function.  She is using Tylenol as needed for pain and takes Tums before using the Tylenol.  She cannot experience that any medications help with her GI symptoms.  Past Medical/Surgical History: Past Medical History:  Diagnosis Date   Arthritis    Chronic pain    Contact lens/glasses fitting    wears contacts or glasses   Depression    Fibromyalgia    Melanoma ([place])    Peritoneal carcinoma ([place])    PONV (postoperative nausea and vomiting)    prolonged sedation very hard to wake    Past Surgical History:  Procedure Laterality Date   BACK SURGERY     lumb lam   BREAST BIOPSY Right    2021   BREAST BIOPSY Left    BREAST ENHANCEMENT SURGERY  05/30/1980   implants    BREAST IMPLANT EXCHANGE  05/30/2009   CARPAL TUNNEL RELEASE  05/30/2001   rt and lt   CARPOMETACARPEL SUSPENSION PLASTY   06/22/2012   Procedure: CARPOMETACARPEL ([place]) SUSPENSION PLASTY;  Surgeon: Alta Corning, MD;  Location: [facility];  Service: Orthopedics;  Laterality: Left;  burton's interposition arthroplasty left thumb    CATARACT EXTRACTION W/ INTRAOCULAR LENS  IMPLANT, BILATERAL     COLONOSCOPY     x2   DIAGNOSTIC LAPAROSCOPY  05/30/2000   expl lap   DILATION AND CURETTAGE OF UTERUS  05/30/2001   ablation   ELBOW SURGERY  05/31/1999   decompression rt   EPIDURAL BLOCK INJECTION     multiple   LYMPH NODE DISSECTION N/A 12/01/2020   Procedure: LYMPH NODE DISSECTION;  Surgeon: Lafonda Mosses, MD;  Location: [facility];  Service: Gynecology;  Laterality: N/A;   SKIN CANCER EXCISION     melanoma, from abdomen   TONSILLECTOMY      Family History  Problem Relation Age of Onset   Lung cancer Father    Colon cancer Neg Hx    Breast cancer Neg Hx    Ovarian cancer Neg Hx    Endometrial cancer Neg Hx    Pancreatic cancer Neg Hx    Prostate cancer Neg Hx     Social History   Socioeconomic History   Marital status: Married    Spouse name:  Not on file   Number of children: Not on file   Years of education: Not on file   Highest education level: Not on file  Occupational History   Not on file  Tobacco Use   Smoking status: Never   Smokeless tobacco: Never  Vaping Use   Vaping Use: Never used  Substance and Sexual Activity   Alcohol use: Yes    Comment: rare   Drug use: No   Sexual activity: Yes    Partners: Male    Birth control/protection: Post-menopausal  Other Topics Concern   Not on file  Social History Narrative   Not on file   Social Determinants of Health   Financial Resource Strain: Not on file  Food Insecurity: Not on file  Transportation Needs: Not on file  Physical Activity: Not on file  Stress: Not on file  Social Connections: Not on file    Current Medications:  Current Outpatient Medications:    alum & mag hydroxide-simeth (MAALOX/MYLANTA)  200-200-20 MG/5ML suspension, Take 30 mLs by mouth every 4 (four) hours as needed for up to 6 doses for indigestion or heartburn., Disp: 180 mL, Rfl: 0   Apoaequorin (PREVAGEN PO), Take 1 tablet by mouth in the morning., Disp: , Rfl:    Cranberry-Vitamin C (CRANBERRY CONCENTRATE/VITAMINC PO), Take 2 tablets by mouth in the morning., Disp: , Rfl:    dexamethasone (DECADRON) 4 MG tablet, Take 2 tablets the night before and 2 tablets the morning of chemotherapy, every 3 weeks for 6 cycles, Disp: 36 tablet, Rfl: 6   enoxaparin (LOVENOX) 40 MG/0.4ML injection, Inject 0.4 mLs (40 mg total) into the skin daily for 26 doses., Disp: 10.4 mL, Rfl: 0   famotidine (PEPCID) 20 MG tablet, Take 1 tablet (20 mg total) by mouth daily. Can take a second dose as needed daily, Disp: 40 tablet, Rfl: 3   FLUoxetine (PROZAC) 20 MG tablet, Take 10 mg by mouth every evening., Disp: , Rfl:    lidocaine-prilocaine (EMLA) cream, Apply to affected area daily as instructed., Disp: 30 g, Rfl: 11   mirtazapine (REMERON) 15 MG tablet, Take 1 tablet (15 mg total) by mouth at bedtime., Disp: 30 tablet, Rfl: 3   Omega-3 Fatty Acids (FISH OIL CONCENTRATE PO), Take 1 capsule by mouth 3 (three) times a week., Disp: , Rfl:    ondansetron (ZOFRAN) 8 MG tablet, Take 1 tablet by mouth 2 times daily as needed for refractory nausea / vomiting. Start on day 3 after carboplatin chemo., Disp: 30 tablet, Rfl: 1   Probiotic Product (PROBIOTIC PO), Take 1 capsule by mouth in the morning. OLLY [place] [employer] Probiotic Capsules, Disp: , Rfl:    prochlorperazine (COMPAZINE) 10 MG tablet, Take 1 tablet (10 mg total) by mouth every 6 (six) hours as needed (Nausea or vomiting)., Disp: 30 tablet, Rfl: 1   senna-docusate (SENOKOT-S) 8.6-50 MG tablet, Take 2 tablets by mouth at bedtime. For AFTER surgery, do not take if having diarrhea, Disp: 30 tablet, Rfl: 0   traMADol (ULTRAM) 50 MG tablet, Take 1 tablet (50 mg total) by mouth every 6 (six) hours as  needed for severe pain. For AFTER surgery pain only, do not take and drive, Disp: 10 tablet, Rfl: 0  Review of Systems: Pertinent positives as per HPI. Denies fevers, chills, fatigue, unexplained weight changes. Denies hearing loss, neck lumps or masses, mouth sores, ringing in ears or voice changes. Denies cough or wheezing.  Denies shortness of breath. Denies chest pain or  palpitations. Denies leg swelling. Denies abdominal distention, blood in stools, nausea, vomiting, or early satiety. Denies pain with intercourse, dysuria, frequency, hematuria or incontinence. Denies hot flashes, pelvic pain, vaginal bleeding or vaginal discharge.   Denies joint pain or muscle pain/cramps. Denies itching, rash, or wounds. Denies dizziness, headaches, numbness or seizures. Denies swollen lymph nodes or glands, denies easy bruising or bleeding. Denies confusion, or decreased concentration.  Physical Exam: BP (!) 142/65 (BP Location: Right Arm, Patient Position: Sitting)   Pulse 78   Temp 97.8 F (36.6 C) (Tympanic)   Resp 18   Ht $R'5\' 3"'Bm$  (1.6 m)   Wt 131 lb (59.4 kg)   SpO2 100%   BMI 23.21 kg/m  General: Alert, oriented, no acute distress. HEENT: Normocephalic, atraumatic, sclera anicteric. Chest: Unlabored breathing on room air. Abdomen: soft, mildly tender in the lower quadrants, minimally distended.  Normoactive bowel sounds.  No masses or hepatosplenomegaly appreciated.  Well-healing incisions and midline laparotomy.  Remaining Dermabond removed. Extremities: Grossly normal range of motion.  Warm, well perfused.  No edema bilaterally. GU: Normal appearing external genitalia without erythema, excoriation, or lesions.  Speculum exam reveals no discharge or bleeding, cuff intact, suture still visible.  Bimanual exam reveals cuff intact, no fluctuance or significant tenderness on palpation.    Laboratory & Radiologic Studies: None new  Assessment & Plan: [name] [name] is a 65 y.o. woman  with Stage IIIC presumed primary peritoneal carcinoma, low-grade, who presents for postoperative follow-up, treatment discussion.  The patient has had a slow recovery, but is improving and has had significant decrease in postoperative pain.  She continues to have significant GI dysfunction, more similar now to what she experienced before surgery.  We discussed that this is still somewhat incongruent with what I saw at surgery.  She has no evidence of miliary disease or carcinomatosis that should be causing bowel dysfunction.  We reviewed postoperative restrictions as well as continued expectations over the next several weeks.  She has less than a week left of her Lovenox.  We discussed trying to increase her fiber intake to this helps with her diarrhea.  I congratulated her on increasing her protein consumption.  Her weight has now been stable for over a week on her home scale.  She is met with Dr. Alvy Bimler and scheduled to start chemotherapy in about 2 weeks.  She is very nervous about this.  I was encouraging today because I am hopeful that if her GI dysfunction is related to her cancer (all started around the same time that she fell, hurt her back, and began having low back pain), she may have improvement in her symptoms with treatment of her cancer response to chemotherapy.  The patient knows that she can call my clinic if any questions or concerns,.  Otherwise, I will plan to see her back after she finishes adjuvant treatment.  38 minutes of total time was spent for this patient encounter, including preparation, face-to-face counseling with the patient and coordination of care, and documentation of the encounter.  Jeral Pinch, MD  Division of Gynecologic Oncology  Department of Obstetrics and Gynecology  [facility]

## 2021-01-01 ENCOUNTER — Other Ambulatory Visit: Payer: Self-pay | Admitting: Radiology

## 2021-01-01 NOTE — Progress Notes (Signed)
Pharmacist Chemotherapy Monitoring - Initial Assessment    Anticipated start date: 01/08/21   The following has been reviewed per standard work regarding the patient's treatment regimen: The patient's diagnosis, treatment plan and drug doses, and organ/hematologic function Lab orders and baseline tests specific to treatment regimen  The treatment plan start date, drug sequencing, and pre-medications Prior authorization status  Patient's documented medication list, including drug-drug interaction screen and prescriptions for anti-emetics and supportive care specific to the treatment regimen The drug concentrations, fluid compatibility, administration routes, and timing of the medications to be used The patient's access for treatment and lifetime cumulative dose history, if applicable  The patient's medication allergies and previous infusion related reactions, if applicable   Changes made to treatment plan:  N/A  Follow up needed:  Pending authorization for treatment    Larene Beach, [place], 01/01/2021  2:07 PM

## 2021-01-04 ENCOUNTER — Other Ambulatory Visit: Payer: Self-pay

## 2021-01-04 ENCOUNTER — Encounter (HOSPITAL_COMMUNITY): Payer: Self-pay

## 2021-01-04 ENCOUNTER — Ambulatory Visit (HOSPITAL_COMMUNITY)
Admission: RE | Admit: 2021-01-04 | Discharge: 2021-01-04 | Disposition: A | Discharge status: Home / Self Care | Payer: Medicare Other | Source: Ambulatory Visit | Attending: Hematology and Oncology | Admitting: Hematology and Oncology

## 2021-01-04 ENCOUNTER — Ambulatory Visit (HOSPITAL_COMMUNITY)
Admission: RE | Admit: 2021-01-04 | Discharge: 2021-01-04 | Disposition: A | Discharge status: Home / Self Care | Payer: Medicare Other | Source: Ambulatory Visit | Attending: Obstetrics & Gynecology | Admitting: Obstetrics & Gynecology

## 2021-01-04 DIAGNOSIS — Z881 Allergy status to other antibiotic agents status: Secondary | ICD-10-CM | POA: Diagnosis not present

## 2021-01-04 DIAGNOSIS — Z91040 Latex allergy status: Secondary | ICD-10-CM | POA: Insufficient documentation

## 2021-01-04 DIAGNOSIS — Z885 Allergy status to narcotic agent status: Secondary | ICD-10-CM | POA: Diagnosis not present

## 2021-01-04 DIAGNOSIS — Z888 Allergy status to other drugs, medicaments and biological substances status: Secondary | ICD-10-CM | POA: Diagnosis not present

## 2021-01-04 DIAGNOSIS — C481 Malignant neoplasm of specified parts of peritoneum: Secondary | ICD-10-CM | POA: Diagnosis not present

## 2021-01-04 HISTORY — PX: IR IMAGING GUIDED PORT INSERTION: IMG5740

## 2021-01-04 MED ORDER — LIDOCAINE-EPINEPHRINE 1 %-1:100000 IJ SOLN
INTRAMUSCULAR | Status: AC | PRN
  Administered 2021-01-04: 20 mL

## 2021-01-04 MED ORDER — FENTANYL CITRATE (PF) 100 MCG/2ML IJ SOLN
INTRAMUSCULAR | Status: AC | PRN
  Administered 2021-01-04 (×2): 50 ug via INTRAVENOUS

## 2021-01-04 MED ORDER — LIDOCAINE-EPINEPHRINE (PF) 2 %-1:200000 IJ SOLN
INTRAMUSCULAR | Status: AC
  Filled 2021-01-04: qty 20

## 2021-01-04 MED ORDER — MIDAZOLAM HCL 2 MG/2ML IJ SOLN
INTRAMUSCULAR | Status: AC | PRN
  Administered 2021-01-04 (×2): 1 mg via INTRAVENOUS

## 2021-01-04 MED ORDER — HEPARIN SOD (PORK) LOCK FLUSH 100 UNIT/ML IV SOLN
INTRAVENOUS | Status: AC
  Filled 2021-01-04: qty 5

## 2021-01-04 MED ORDER — MIDAZOLAM HCL 2 MG/2ML IJ SOLN
INTRAMUSCULAR | Status: AC
  Filled 2021-01-04: qty 4

## 2021-01-04 MED ORDER — SODIUM CHLORIDE 0.9 % IV SOLN: INTRAVENOUS | Status: DC

## 2021-01-04 MED ORDER — FENTANYL CITRATE (PF) 100 MCG/2ML IJ SOLN
INTRAMUSCULAR | Status: AC
  Filled 2021-01-04: qty 2

## 2021-01-04 MED ORDER — HEPARIN SOD (PORK) LOCK FLUSH 100 UNIT/ML IV SOLN
INTRAVENOUS | Status: AC | PRN
  Administered 2021-01-04: 500 [IU] via INTRAVENOUS

## 2021-01-04 NOTE — Discharge Instructions (Signed)
Urgent needs - Interventional Radiology on call MD [phone]  Wound - May remove dressing and shower in 24 to 48 hours.  Keep site clean and dry.  Replace with bandaid as needed.  Do not submerge in tub or water until site healing well. If closed with glue, glue will flake off on its own.  If ordered by your provider, may start Emla cream in 2 weeks or after incision is healed.  After completion of treatment, your provider should have you set up for monthly port flushes.   

## 2021-01-04 NOTE — Consult Note (Signed)
Chief Complaint: Patient was seen in consultation today for Port-A-Cath placement  Referring Physician(s): [place]  Supervising Physician: Ruthann Cancer  Patient Status: [facility] - Out-pt  History of Present Illness: [name] [name] is a 65 y.o. female with history of primary extraovarian peritoneal carcinoma diagnosed in June of this year, status post surgery, who presents today for Port-A-Cath placement for chemotherapy.  Past Medical History:  Diagnosis Date   Arthritis    Chronic pain    Contact lens/glasses fitting    wears contacts or glasses   Depression    Fibromyalgia    Melanoma ([place])    Peritoneal carcinoma ([place])    PONV (postoperative nausea and vomiting)    prolonged sedation very hard to wake    Past Surgical History:  Procedure Laterality Date   BACK SURGERY     lumb lam   BREAST BIOPSY Right    2021   BREAST BIOPSY Left    BREAST ENHANCEMENT SURGERY  05/30/1980   implants    BREAST IMPLANT EXCHANGE  05/30/2009   CARPAL TUNNEL RELEASE  05/30/2001   rt and lt   CARPOMETACARPEL SUSPENSION PLASTY  06/22/2012   Procedure: CARPOMETACARPEL ([place]) SUSPENSION PLASTY;  Surgeon: Alta Corning, MD;  Location: [facility];  Service: Orthopedics;  Laterality: Left;  burton's interposition arthroplasty left thumb    CATARACT EXTRACTION W/ INTRAOCULAR LENS  IMPLANT, BILATERAL     COLONOSCOPY     x2   DIAGNOSTIC LAPAROSCOPY  05/30/2000   expl lap   DILATION AND CURETTAGE OF UTERUS  05/30/2001   ablation   ELBOW SURGERY  05/31/1999   decompression rt   EPIDURAL BLOCK INJECTION     multiple   LYMPH NODE DISSECTION N/A 12/01/2020   Procedure: LYMPH NODE DISSECTION;  Surgeon: Lafonda Mosses, MD;  Location: [facility];  Service: Gynecology;  Laterality: N/A;   SKIN CANCER EXCISION     melanoma, from abdomen   TONSILLECTOMY      Allergies: Ciprofloxacin-ciproflox hcl er, Amoxicillin, Bupropion, Codeine, Duloxetine, Methylphenidate, Other, and  Latex  Medications: Prior to Admission medications   Medication Sig Start Date End Date Taking? Authorizing Provider  alum & mag hydroxide-simeth (MAALOX/MYLANTA) 200-200-20 MG/5ML suspension Take 30 mLs by mouth every 4 (four) hours as needed for up to 6 doses for indigestion or heartburn. 12/03/20  Yes Lafonda Mosses, MD  famotidine (PEPCID) 20 MG tablet Take 1 tablet (20 mg total) by mouth daily. Can take a second dose as needed daily 12/07/20 05/16/21 Yes Lafonda Mosses, MD  mirtazapine (REMERON) 15 MG tablet Take 1 tablet (15 mg total) by mouth at bedtime. Patient taking differently: Take 15 mg by mouth at bedtime. Takes 1/2 tablet 12/15/20  Yes Gorsuch, Ni, MD  Apoaequorin (PREVAGEN PO) Take 1 tablet by mouth in the morning.    [provider]  Cranberry-Vitamin C (CRANBERRY CONCENTRATE/VITAMINC PO) Take 2 tablets by mouth in the morning.    [provider]  dexamethasone (DECADRON) 4 MG tablet Take 2 tablets the night before and 2 tablets the morning of chemotherapy, every 3 weeks for 6 cycles 12/15/20   Heath Lark, MD  enoxaparin (LOVENOX) 40 MG/0.4ML injection Inject 0.4 mLs (40 mg total) into the skin daily for 26 doses. 12/03/20 12/29/20  Lafonda Mosses, MD  FLUoxetine (PROZAC) 20 MG tablet Take 10 mg by mouth every evening. 09/24/20   [provider]  lidocaine-prilocaine (EMLA) cream Apply to affected area daily as instructed. 12/15/20 01/14/21  Heath Lark, MD  Omega-3 Fatty Acids (FISH OIL CONCENTRATE PO) Take 1 capsule by mouth 3 (three) times a week.    [provider]  ondansetron (ZOFRAN) 8 MG tablet Take 1 tablet by mouth 2 times daily as needed for refractory nausea / vomiting. Start on day 3 after carboplatin chemo. 12/15/20   Heath Lark, MD  Probiotic Product (PROBIOTIC PO) Take 1 capsule by mouth in the morning. OLLY [place] [employer] Probiotic Capsules    [provider]  prochlorperazine (COMPAZINE) 10 MG tablet Take 1  tablet (10 mg total) by mouth every 6 (six) hours as needed (Nausea or vomiting). 12/15/20   Heath Lark, MD  senna-docusate (SENOKOT-S) 8.6-50 MG tablet Take 2 tablets by mouth at bedtime. For AFTER surgery, do not take if having diarrhea 11/26/20   Cross, Lenna Sciara D, NP  traMADol (ULTRAM) 50 MG tablet Take 1 tablet (50 mg total) by mouth every 6 (six) hours as needed for severe pain. For AFTER surgery pain only, do not take and drive [id number]   Dorothyann Gibbs, NP     Family History  Problem Relation Age of Onset   Lung cancer Father    Colon cancer Neg Hx    Breast cancer Neg Hx    Ovarian cancer Neg Hx    Endometrial cancer Neg Hx    Pancreatic cancer Neg Hx    Prostate cancer Neg Hx     Social History   Socioeconomic History   Marital status: Married    Spouse name: Not on file   Number of children: Not on file   Years of education: Not on file   Highest education level: Not on file  Occupational History   Not on file  Tobacco Use   Smoking status: Never   Smokeless tobacco: Never  Vaping Use   Vaping Use: Never used  Substance and Sexual Activity   Alcohol use: Yes    Comment: rare   Drug use: No   Sexual activity: Yes    Partners: Male    Birth control/protection: Post-menopausal  Other Topics Concern   Not on file  Social History Narrative   Not on file   Social Determinants of Health   Financial Resource Strain: Not on file  Food Insecurity: Not on file  Transportation Needs: Not on file  Physical Activity: Not on file  Stress: Not on file  Social Connections: Not on file      Review of Systems denies fever, headache, chest pain, dyspnea, cough, nausea, vomiting or bleeding. She  does have some intermittent abdominal and back pain  Vital Signs: BP 131/77 (BP Location: Right Arm)   Pulse 73   Temp 98.5 F (36.9 C) (Oral)   Resp 15   SpO2 100%   Physical Exam awake, alert.  Chest clear to auscultation bilaterally.  Heart with regular rate and  rhythm.  Abdomen soft, positive bowel sounds, some tenderness along the incision site from recent surgery; no lower extremity edema  Imaging: No results found.  Labs:  CBC: Recent Labs    11/27/20 1129 12/02/20 0315 12/03/20 0800  WBC 6.4 15.8* 8.1  HGB 13.6 12.7 12.9  HCT 40.9 37.8 38.8  PLT 216 215 197    COAGS: No results for input(s): INR, APTT in the last 8760 hours.  BMP: Recent Labs    11/27/20 1129 12/02/20 0315 12/03/20 0800  NA 139 133* 138  K 3.9 4.3 3.6  CL 105 98 104  CO2  $'28 24 29  'Y$ GLUCOSE 102* 169* 125*  BUN '12 10 10  '$ CALCIUM 9.6 8.8* 8.6*  CREATININE 0.77 0.90 0.84  GFRNONAA >60 >60 >60    LIVER FUNCTION TESTS: No results for input(s): BILITOT, AST, ALT, ALKPHOS, PROT, ALBUMIN in the last 8760 hours.  TUMOR MARKERS: No results for input(s): AFPTM, CEA, CA199, CHROMGRNA in the last 8760 hours.  Assessment and Plan: 65 y.o. female with history of primary extraovarian peritoneal carcinoma diagnosed in June of this year, status post surgery, who presents today for Port-A-Cath placement for chemotherapy.Risks and benefits of image guided port-a-catheter placement was discussed with the patient including, but not limited to bleeding, infection, pneumothorax, or fibrin sheath development and need for additional procedures.  All of the patient's questions were answered, patient is agreeable to proceed. Consent signed and in chart.    Thank you for this interesting consult.  I greatly enjoyed meeting NATION ALSMAN and look forward to participating in their care.  A copy of this report was sent to the requesting provider on this date.  Electronically Signed: D. Rowe Robert, PA-C 01/04/2021, 11:24 AM   I spent a total of  25 minutes   in face to face in clinical consultation, greater than 50% of which was counseling/coordinating care for Port-A-Cath placement

## 2021-01-04 NOTE — Procedures (Signed)
Interventional Radiology Procedure Note ° °Procedure: Single Lumen Power Port Placement   ° °Access:  Right internal jugular vein ° °Findings: Catheter tip positioned at cavoatrial junction. Port is ready for immediate use.  ° °Complications: None ° °EBL: < 10 mL ° °Recommendations:  °- Ok to shower in 24 hours °- Do not submerge for 7 days °- Routine line care  ° ° °[name] Corella, MD ° ° ° °

## 2021-01-06 ENCOUNTER — Encounter (HOSPITAL_COMMUNITY): Payer: Self-pay | Admitting: Gynecologic Oncology

## 2021-01-07 ENCOUNTER — Encounter: Payer: Self-pay | Admitting: Hematology and Oncology

## 2021-01-07 MED FILL — Dexamethasone Sodium Phosphate Inj 100 MG/10ML: INTRAMUSCULAR | Qty: 1 | Status: AC

## 2021-01-07 MED FILL — Fosaprepitant Dimeglumine For IV Infusion 150 MG (Base Eq): INTRAVENOUS | Qty: 5 | Status: AC

## 2021-01-07 NOTE — Progress Notes (Signed)
Called pt to introduce myself as her Arboriculturist.  Unfortunately there aren't any foundations offering copay assistance for her Dx and the type of ins she has.  I informed her of the [employer] and went over what it covers.  Pt declined the grant and stated she may not qualify so I will request the registration staff leave her my card in case she changes her mind and would like to apply in the future.

## 2021-01-08 ENCOUNTER — Inpatient Hospital Stay: Payer: Medicare Other | Attending: Hematology and Oncology

## 2021-01-08 ENCOUNTER — Inpatient Hospital Stay (HOSPITAL_BASED_OUTPATIENT_CLINIC_OR_DEPARTMENT_OTHER): Payer: Medicare Other

## 2021-01-08 ENCOUNTER — Encounter: Payer: Self-pay | Admitting: Hematology and Oncology

## 2021-01-08 ENCOUNTER — Other Ambulatory Visit: Payer: Self-pay

## 2021-01-08 ENCOUNTER — Inpatient Hospital Stay: Payer: Medicare Other | Admitting: Hematology and Oncology

## 2021-01-08 VITALS — BP 132/79 | HR 91 | Temp 98.1°F | Resp 18

## 2021-01-08 DIAGNOSIS — Z5111 Encounter for antineoplastic chemotherapy: Secondary | ICD-10-CM | POA: Diagnosis not present

## 2021-01-08 DIAGNOSIS — Z7952 Long term (current) use of systemic steroids: Secondary | ICD-10-CM | POA: Diagnosis not present

## 2021-01-08 DIAGNOSIS — C482 Malignant neoplasm of peritoneum, unspecified: Secondary | ICD-10-CM | POA: Diagnosis present

## 2021-01-08 DIAGNOSIS — Z79899 Other long term (current) drug therapy: Secondary | ICD-10-CM | POA: Diagnosis not present

## 2021-01-08 DIAGNOSIS — C481 Malignant neoplasm of specified parts of peritoneum: Secondary | ICD-10-CM

## 2021-01-08 DIAGNOSIS — R634 Abnormal weight loss: Secondary | ICD-10-CM

## 2021-01-08 DIAGNOSIS — J029 Acute pharyngitis, unspecified: Secondary | ICD-10-CM | POA: Insufficient documentation

## 2021-01-08 LAB — CMP (CANCER CENTER ONLY)
ALT: 34 U/L (ref 0–44)
AST: 19 U/L (ref 15–41)
Albumin: 4.1 g/dL (ref 3.5–5.0)
Alkaline Phosphatase: 70 U/L (ref 38–126)
Anion gap: 12 (ref 5–15)
BUN: 18 mg/dL (ref 8–23)
CO2: 24 mmol/L (ref 22–32)
Calcium: 9.9 mg/dL (ref 8.9–10.3)
Chloride: 106 mmol/L (ref 98–111)
Creatinine: 0.79 mg/dL (ref 0.44–1.00)
GFR, Estimated: >60 mL/min (ref >60)
Glucose, Bld: 149 mg/dL — ABNORMAL HIGH (ref 70–99)
Potassium: 4.1 mmol/L (ref 3.5–5.1)
Sodium: 142 mmol/L (ref 135–145)
Total Bilirubin: 0.4 mg/dL (ref 0.3–1.2)
Total Protein: 7.3 g/dL (ref 6.5–8.1)

## 2021-01-08 LAB — CBC WITH DIFFERENTIAL (CANCER CENTER ONLY)
Abs Immature Granulocytes: 0.01 10*3/uL (ref 0.00–0.07)
Basophils Absolute: 0 10*3/uL (ref 0.0–0.1)
Basophils Relative: 0 %
Eosinophils Absolute: 0 10*3/uL (ref 0.0–0.5)
Eosinophils Relative: 0 %
HCT: 39.4 % (ref 36.0–46.0)
Hemoglobin: 13.3 g/dL (ref 12.0–15.0)
Immature Granulocytes: 0 %
Lymphocytes Relative: 12 %
Lymphs Abs: 0.9 10*3/uL (ref 0.7–4.0)
MCH: 30.2 pg (ref 26.0–34.0)
MCHC: 33.8 g/dL (ref 30.0–36.0)
MCV: 89.5 fL (ref 80.0–100.0)
Monocytes Absolute: 0.3 10*3/uL (ref 0.1–1.0)
Monocytes Relative: 4 %
Neutro Abs: 6.4 10*3/uL (ref 1.7–7.7)
Neutrophils Relative %: 84 %
Platelet Count: 235 10*3/uL (ref 150–400)
RBC: 4.4 MIL/uL (ref 3.87–5.11)
RDW: 12.8 % (ref 11.5–15.5)
WBC Count: 7.7 10*3/uL (ref 4.0–10.5)
nRBC: 0 % (ref 0.0–0.2)

## 2021-01-08 MED ORDER — SODIUM CHLORIDE 0.9 % IV SOLN
Freq: Once | INTRAVENOUS | Status: AC
  Filled 2021-01-08: qty 250

## 2021-01-08 MED ORDER — DIPHENHYDRAMINE HCL 50 MG/ML IJ SOLN
25.0000 mg | Freq: Once | INTRAMUSCULAR | Status: AC
  Administered 2021-01-08: 25 mg via INTRAVENOUS
  Filled 2021-01-08: qty 1

## 2021-01-08 MED ORDER — HEPARIN SOD (PORK) LOCK FLUSH 100 UNIT/ML IV SOLN
500.0000 [IU] | Freq: Once | INTRAVENOUS | Status: AC | PRN
  Administered 2021-01-08: 500 [IU]
  Filled 2021-01-08: qty 5

## 2021-01-08 MED ORDER — FAMOTIDINE 20 MG IN NS 100 ML IVPB
20.0000 mg | Freq: Once | INTRAVENOUS | Status: AC
  Administered 2021-01-08: 20 mg via INTRAVENOUS
  Filled 2021-01-08: qty 100

## 2021-01-08 MED ORDER — PALONOSETRON HCL INJECTION 0.25 MG/5ML
0.2500 mg | Freq: Once | INTRAVENOUS | Status: AC
  Administered 2021-01-08: 0.25 mg via INTRAVENOUS
  Filled 2021-01-08: qty 5

## 2021-01-08 MED ORDER — SODIUM CHLORIDE 0.9% FLUSH
10.0000 mL | Freq: Once | INTRAVENOUS | Status: AC
  Administered 2021-01-08: 10 mL
  Filled 2021-01-08: qty 10

## 2021-01-08 MED ORDER — SODIUM CHLORIDE 0.9 % IV SOLN
150.0000 mg | Freq: Once | INTRAVENOUS | Status: AC
  Administered 2021-01-08: 150 mg via INTRAVENOUS
  Filled 2021-01-08: qty 150

## 2021-01-08 MED ORDER — SODIUM CHLORIDE 0.9 % IV SOLN
175.0000 mg/m2 | Freq: Once | INTRAVENOUS | Status: AC
  Administered 2021-01-08: 288 mg via INTRAVENOUS
  Filled 2021-01-08: qty 48

## 2021-01-08 MED ORDER — SODIUM CHLORIDE 0.9 % IV SOLN
10.0000 mg | Freq: Once | INTRAVENOUS | Status: AC
  Administered 2021-01-08: 10 mg via INTRAVENOUS
  Filled 2021-01-08: qty 10

## 2021-01-08 MED ORDER — SODIUM CHLORIDE 0.9% FLUSH
10.0000 mL | INTRAVENOUS | Status: DC | PRN
  Administered 2021-01-08: 10 mL
  Filled 2021-01-08: qty 10

## 2021-01-08 MED ORDER — SODIUM CHLORIDE 0.9 % IV SOLN
470.0000 mg | Freq: Once | INTRAVENOUS | Status: AC
  Administered 2021-01-08: 470 mg via INTRAVENOUS
  Filled 2021-01-08: qty 47

## 2021-01-08 NOTE — Progress Notes (Signed)
[place] OFFICE PROGRESS NOTE  Patient Care Team: Algis Greenhouse, MD as PCP - General (Unknown Physician Specialty) Lafonda Mosses, MD as Consulting Physician (Gynecologic Oncology) Heath Lark, MD as Consulting Physician (Hematology and Oncology)  ASSESSMENT & PLAN:  Extraovarian primary peritoneal carcinoma [facility]) She is healed completely from her recent surgery We will proceed with treatment as scheduled I reinforced importance of adequate hydration and regular laxatives to avoid constipation  Weight loss, abnormal She has started to gain some weight We are aiming for 140 pounds goal weight  Sore throat She has mild sore throat, exam is benign She has no signs of leukocytosis and her home testing kit for COVID-19 is negative I recommend salt water gargle for now We will proceed with treatment without delay  No orders of the defined types were placed in this encounter.   All questions were answered. The patient knows to call the clinic with any problems, questions or concerns. The total time spent in the appointment was 20 minutes encounter with patients including review of chart and various tests results, discussions about plan of care and coordination of care plan   Heath Lark, MD 01/08/2021 12:16 PM  INTERVAL HISTORY: Please see below for problem oriented charting. She returns for cycle 1 of treatment She had intermittent sore throat for about a week but denies swallowing difficulties No recent fever or chills She tolerated port placement well She has started to gain some weight  SUMMARY OF ONCOLOGIC HISTORY: Oncology History Overview Note  Low grade serous   Extraovarian primary peritoneal carcinoma ([place])  09/29/2020 Imaging   MRI lumbar spine The dominant finding in this case is that of massive retroperitoneal lymphadenopathy, likely related to either metastatic disease or lymphoma.   Chronic degenerative and postoperative findings of the  spine as outlined in detail above   10/05/2020 Imaging   IMPRESSION outside CT 1. Large calcified retroperitoneal lymph nodes highly concerning for metastatic disease, likely related to primary carcinoma of the colon or lymphoma. Clinical correlation is recommended. 2. No bowel obstruction. Normal appendix. 3. Stable right hepatic dome hemangioma.   10/30/2020 PET scan   1. The densely calcified retroperitoneal adenopathy is highly hypermetabolic with maximum SUV of 23.4 (Deauville 5) there is also Deauville 3 activity in a small right axillary lymph node. Given the lack of an obvious primary outside of the retroperitoneum, appearance tends to favor lymphoma or Castleman's disease. Tissue diagnosis suggested. 2. Other imaging findings of potential clinical significance: Chronic left maxillary sinusitis. Aortic Atherosclerosis (ICD10-I70.0). Coronary atherosclerosis.   11/10/2020 Procedure   CT guided biopsy of retroperitoneal adenopathy   11/10/2020 Pathology Results   biopsy results which favor a low-grade serous carcinoma of gyn origin. This is a somewhat atypical presentation, but my suspicion is that it is primary peritoneal carcinoma   11/16/2020 Tumor Marker   Patient's tumor was tested for the following markers: CA-125. Results of the tumor marker test revealed 96.4.   12/01/2020 Pathology Results   SURGICAL PATHOLOGY  CASE: [phone]  PATIENT: [name] [name]  Surgical Pathology Report   Clinical History: Metastatic low grade serous carcinoma of gyn origin  ([facility])    FINAL MICROSCOPIC DIAGNOSIS:   A. UTERUS, CERVIX, BILATERAL FALLOPIAN TUBES AND OVARIES:  Uterus:  -  Inactive endometrium  -  Leiomyoma (1.1 cm; largest)  -  Calcified nodule of the broad ligament with psammoma bodies (right)  -  No hyperplasia or malignancy identified   Cervix:  -  Benign  cervix  -  No dysplasia or malignancy identified   Ovaries, bilateral:  -  Serous cyst adenofibroma with microscopic  focus of borderline change  (right)    Fallopian tubes, bilateral:  -  Benign fallopian tube with paratubal cyst (right)  -  No malignancy identified   B. OMENTUM, OMENTECTOMY:  -  No carcinoma identified   C. PERIAORTIC TUMOR, EXCISION:  -  Serous carcinoma, low-grade, with psammoma bodies  -  See comment below   COMMENT:   C.  At the request of the treating clinician, immunohistochemistry was repeated.  The neoplastic cells are positive for ER, cytokeratin 7, cytokeratin 5/6, p53, PAX8 and WT1 but negative for cytokeratin 20,  TTF-1, PR and thyroglobulin.  The morphology and immunophenotype are consistent with a primary peritoneal low-grade serous carcinoma (see also, [id number]).   ONCOLOGY TABLE:   OVARY or FALLOPIAN TUBE or PRIMARY PERITONEUM: Resection   Procedure: Total hysterectomy and bilateral salpingo-oophorectomy with omentectomy and periaortic tumor excision  Specimen Integrity: Intact  Tumor Site: Primary peritoneum (see comment)  Tumor Size: See comment  Histologic Type: Low-grade serous carcinoma  Histologic Grade: Low-grade  Ovarian Surface Involvement: Not identified  Fallopian Tube Surface Involvement: Not identified  Implants (required for advanced stage serous/seromucinous borderline  tumors only): Not applicable  Other Tissue/ Organ Involvement: Periaortic tumor  Largest Extrapelvic Peritoneal Focus: 5.5 cm (aggregate measurement)  Peritoneal/Ascitic Fluid Involvement: Not applicable  Chemotherapy Response Score (CRS): Not applicable, no known presurgical therapy  Regional Lymph Nodes: Not applicable (no lymph nodes submitted or found)  Distant Metastasis:       Distant Site(s) Involved: Not applicable  Pathologic Stage Classification (pTNM, AJCC 8th Edition): pT3, pN not assigned  Ancillary Studies: Can be performed upon request  Representative Tumor Block: C1  Comment(s): The only focus of low-grade serous carcinoma identified in the resection  specimen is the tissue submitted as periaortic tumor (part C).  Clinically, this was favored to represent matted lymph nodes; however, there is no evidence of nodal tissue in the examined material. This case was discussed with Dr. Berline Lopes and given the extent of the matted lymph nodes, this is being staged as a pT3.     12/01/2020 Surgery   Pre-operative Diagnosis: low grade serous carcinoma with significant retroperitoneal adenopathy, no other obvious imaging evidence of disease   Post-operative Diagnosis: same, significantly adherent presacral and para-aortic lymphadenopathy   Operation: Robotic-assisted laparoscopic total hysterectomy with bilateral salpingo-oophorectomy, laparotomy, infra-colic omentectomy, portion of necrotic tumor-ridden retroperitoneal lymph nodes, oversew of sigmoid serosa   Surgeon: Jeral Pinch MD   Assistant Surgeon: Lahoma Crocker MD (an MD assistant was necessary for tissue manipulation, management of robotic instrumentation, retraction and positioning due to the complexity of the case and hospital policies).    Intra-operative consultant:  Everitt Amber MD    Operative Findings: On EUA, small mobile uterus. ON intra-abdominal entry, normal liver and diaphragm. One filmy adhesion noted between the right liver lobe and the anterior abdominal wall. The spleen was somewhat lateral and appeared mildly enlarged. There were some adhesions of the infragastric omentum to the anterior abdominal wall in the midline. The omentum was normal appearing without obvious evidence of tumor. Small and large bowel were normal appearing, appendix normal. Uterus 6cm and normal in appearance. Somewhat atrophic bilateral adnexa. There were filmy adhesions between each adnexa and the pelvic sidewall. Some filmy adhesions were also noted between the posterior uterus and rectum. Approximately 0.5-1cm white nodule within the broad ligament, lateral  to the right cornua, c/w possible fibroma vs  fibroid. No pelvic adenopathy. Significantly enlarged, necrotic, and tumor-filled lymph node conglomeration was noted starting within the presacral space (adherent to the sacral promontory) and extending superiorly along the great vessels to the level of the renal vessel (suspected). Given adherence of the involved lymph nodes, I could not visualize this anatomy, I could long palpate along each lateral aspect of the aorta. There was no identifiable plane between matted lymph nodes and underlying aorta or IVC. Given burden of disease and my concern that resection was not feasible, I asked my senior partner (Dr. Denman George) to evaluate intra-op. She agreed that moving forward with attempt at debulking involved lymph nodes was likely not feasible and an attempt at this time would be unsafe.   There was no obvious peritoneal disease and no clear involvement of gyn organs. Despite prior biopsy, findings at surgery remain concerning for a non-gynecologic process. If this is confirmed to be low-grade serous carcinoma of gyn origin, this was an R2 resection.    12/15/2020 Initial Diagnosis   Extraovarian primary peritoneal carcinoma ([place])   12/15/2020 Cancer Staging   Staging form: Ovary, AJCC 7th Edition - Pathologic stage from 12/15/2020: Stage IIIC (T3c, N0, cM0) - Signed by Heath Lark, MD on 12/15/2020 Staged by: Managing physician Stage prefix: Initial diagnosis   01/04/2021 Procedure   Successful placement of a power injectable Port-A-Cath via the right internal jugular vein. The catheter is ready for immediate use.   01/08/2021 -  Chemotherapy    Patient is on Treatment Plan: OVARIAN CARBOPLATIN (AUC 6) / PACLITAXEL (175) Q21D X 6 CYCLES         REVIEW OF SYSTEMS:   Constitutional: Denies fevers, chills or abnormal weight loss Eyes: Denies blurriness of vision Ears, nose, mouth, throat, and face: Denies mucositis or sore throat Respiratory: Denies cough, dyspnea or wheezes Cardiovascular: Denies  palpitation, chest discomfort or lower extremity swelling Gastrointestinal:  Denies nausea, heartburn or change in bowel habits Skin: Denies abnormal skin rashes Lymphatics: Denies new lymphadenopathy or easy bruising Neurological:Denies numbness, tingling or new weaknesses Behavioral/Psych: Mood is stable, no new changes  All other systems were reviewed with the patient and are negative.  I have reviewed the past medical history, past surgical history, social history and family history with the patient and they are unchanged from previous note.  ALLERGIES:  is allergic to ciprofloxacin-ciproflox hcl er, amoxicillin, bupropion, codeine, duloxetine, methylphenidate, other, and latex.  MEDICATIONS:  Current Outpatient Medications  Medication Sig Dispense Refill   alum & mag hydroxide-simeth (MAALOX/MYLANTA) 200-200-20 MG/5ML suspension Take 30 mLs by mouth every 4 (four) hours as needed for up to 6 doses for indigestion or heartburn. 180 mL 0   Apoaequorin (PREVAGEN PO) Take 1 tablet by mouth in the morning.     Cranberry-Vitamin C (CRANBERRY CONCENTRATE/VITAMINC PO) Take 2 tablets by mouth in the morning.     dexamethasone (DECADRON) 4 MG tablet Take 2 tablets the night before and 2 tablets the morning of chemotherapy, every 3 weeks for 6 cycles 36 tablet 6   famotidine (PEPCID) 20 MG tablet Take 1 tablet (20 mg total) by mouth daily. Can take a second dose as needed daily 40 tablet 3   FLUoxetine (PROZAC) 20 MG tablet Take 10 mg by mouth every evening.     lidocaine-prilocaine (EMLA) cream Apply to affected area daily as instructed. 30 g 11   mirtazapine (REMERON) 15 MG tablet Take 1 tablet (15 mg total) by  mouth at bedtime. (Patient taking differently: Take 15 mg by mouth at bedtime. Takes 1/2 tablet) 30 tablet 3   Omega-3 Fatty Acids (FISH OIL CONCENTRATE PO) Take 1 capsule by mouth 3 (three) times a week.     ondansetron (ZOFRAN) 8 MG tablet Take 1 tablet by mouth 2 times daily as needed  for refractory nausea / vomiting. Start on day 3 after carboplatin chemo. 30 tablet 1   Probiotic Product (PROBIOTIC PO) Take 1 capsule by mouth in the morning. OLLY [employer] [place] [employer] Probiotic Capsules     prochlorperazine (COMPAZINE) 10 MG tablet Take 1 tablet (10 mg total) by mouth every 6 (six) hours as needed (Nausea or vomiting). 30 tablet 1   senna-docusate (SENOKOT-S) 8.6-50 MG tablet Take 2 tablets by mouth at bedtime. For AFTER surgery, do not take if having diarrhea 30 tablet 0   traMADol (ULTRAM) 50 MG tablet Take 1 tablet (50 mg total) by mouth every 6 (six) hours as needed for severe pain. For AFTER surgery pain only, do not take and drive 10 tablet 0   No current facility-administered medications for this visit.   Facility-Administered Medications Ordered in Other Visits  Medication Dose Route Frequency Provider Last Rate Last Admin   CARBOplatin (PARAPLATIN) 470 mg in sodium chloride 0.9 % 250 mL chemo infusion  470 mg Intravenous Once Alvy Bimler, Kateryn Marasigan, MD       dexamethasone (DECADRON) 10 mg in sodium chloride 0.9 % 50 mL IVPB  10 mg Intravenous Once Alvy Bimler, Roisin Mones, MD       fosaprepitant (EMEND) 150 mg in sodium chloride 0.9 % 145 mL IVPB  150 mg Intravenous Once Alvy Bimler, Henritta Mutz, MD       heparin lock flush 100 unit/mL  500 Units Intracatheter Once PRN Alvy Bimler, Andreka Stucki, MD       PACLitaxel (TAXOL) 288 mg in sodium chloride 0.9 % 250 mL chemo infusion (> 23m/m2)  175 mg/m2 (Treatment Plan Recorded) Intravenous Once GAlvy Bimler Jayleene Glaeser, MD       sodium chloride flush (NS) 0.9 % injection 10 mL  10 mL Intracatheter PRN GAlvy Bimler Abuk Selleck, MD        PHYSICAL EXAMINATION: ECOG PERFORMANCE STATUS: 1 - Symptomatic but completely ambulatory  Vitals:   01/08/21 1057  BP: (!) 149/83  Pulse: 74  Resp: 18  Temp: 97.6 F (36.4 C)  SpO2: 100%   Filed Weights   01/08/21 1057  Weight: 136 lb (61.7 kg)    GENERAL:alert, no distress and comfortable SKIN: skin color, texture, turgor are normal, no rashes or  significant lesions EYES: normal, Conjunctiva are pink and non-injected, sclera clear OROPHARYNX:no exudate, no erythema and lips, buccal mucosa, and tongue normal  NECK: supple, thyroid normal size, non-tender, without nodularity LYMPH:  no palpable lymphadenopathy in the cervical, axillary or inguinal LUNGS: clear to auscultation and percussion with normal breathing effort HEART: regular rate & rhythm and no murmurs and no lower extremity edema ABDOMEN:abdomen soft, non-tender and normal bowel sounds.  Noted well-healed surgical scar Musculoskeletal:no cyanosis of digits and no clubbing  NEURO: alert & oriented x 3 with fluent speech, no focal motor/sensory deficits  LABORATORY DATA:  I have reviewed the data as listed    Component Value Date/Time   NA 142 01/08/2021 1024   K 4.1 01/08/2021 1024   CL 106 01/08/2021 1024   CO2 24 01/08/2021 1024   GLUCOSE 149 (H) 01/08/2021 1024   BUN 18 01/08/2021 1024   CREATININE 0.79 01/08/2021 1024   CALCIUM 9.9  01/08/2021 1024   PROT 7.3 01/08/2021 1024   ALBUMIN 4.1 01/08/2021 1024   AST 19 01/08/2021 1024   ALT 34 01/08/2021 1024   ALKPHOS 70 01/08/2021 1024   BILITOT 0.4 01/08/2021 1024   GFRNONAA >60 01/08/2021 1024    No results found for: SPEP, UPEP  Lab Results  Component Value Date   WBC 7.7 01/08/2021   NEUTROABS 6.4 01/08/2021   HGB 13.3 01/08/2021   HCT 39.4 01/08/2021   MCV 89.5 01/08/2021   PLT 235 01/08/2021      Chemistry      Component Value Date/Time   NA 142 01/08/2021 1024   K 4.1 01/08/2021 1024   CL 106 01/08/2021 1024   CO2 24 01/08/2021 1024   BUN 18 01/08/2021 1024   CREATININE 0.79 01/08/2021 1024      Component Value Date/Time   CALCIUM 9.9 01/08/2021 1024   ALKPHOS 70 01/08/2021 1024   AST 19 01/08/2021 1024   ALT 34 01/08/2021 1024   BILITOT 0.4 01/08/2021 1024       RADIOGRAPHIC STUDIES: I have personally reviewed the radiological images as listed and agreed with the findings in the  report. IR IMAGING GUIDED PORT INSERTION  Result Date: 01/04/2021 INDICATION: 65 year old female with history of extra ovarian primary peritoneal carcinoma requiring central venous access for chemotherapy. EXAM: IMPLANTED PORT A CATH PLACEMENT WITH ULTRASOUND AND FLUOROSCOPIC GUIDANCE COMPARISON:  None. MEDICATIONS: None. ANESTHESIA/SEDATION: Moderate (conscious) sedation was employed during this procedure. A total of Versed 2 mg and Fentanyl 100 mcg was administered intravenously. Moderate Sedation Time: 16 minutes. The patient's level of consciousness and vital signs were monitored continuously by radiology nursing throughout the procedure under my direct supervision. CONTRAST:  None FLUOROSCOPY TIME:  0 minutes, 6 seconds (1 mGy) COMPLICATIONS: None immediate. PROCEDURE: The procedure, risks, benefits, and alternatives were explained to the patient. Questions regarding the procedure were encouraged and answered. The patient understands and consents to the procedure. The right neck and chest were prepped with chlorhexidine in a sterile fashion, and a sterile drape was applied covering the operative field. Maximum barrier sterile technique with sterile gowns and gloves were used for the procedure. A timeout was performed prior to the initiation of the procedure. Ultrasound was used to examine the jugular vein which was compressible and free of internal echoes. A skin marker was used to demarcate the planned venotomy and port pocket incision sites. Local anesthesia was provided to these sites and the subcutaneous tunnel track with 1% lidocaine with 1:100,000 epinephrine. A small incision was created at the jugular access site and blunt dissection was performed of the subcutaneous tissues. Under ultrasound guidance, the jugular vein was accessed with a 21 ga micropuncture needle and an 0.018" wire was inserted to the superior vena cava. Real-time ultrasound guidance was utilized for vascular access including the  acquisition of a permanent ultrasound image documenting patency of the accessed vessel. A 5 Fr micopuncture set was then used, through which a 0.035" Rosen wire was passed under fluoroscopic guidance into the inferior vena cava. An 8 Fr dilator was then placed over the wire. A subcutaneous port pocket was then created along the upper chest wall utilizing a combination of sharp and blunt dissection. The pocket was irrigated with sterile saline, packed with gauze, and observed for hemorrhage. A single lumen "ISP" sized power injectable port was chosen for placement. The 8 Fr catheter was tunneled from the port pocket site to the venotomy incision. The port was placed  in the pocket. The external catheter was trimmed to appropriate length. The dilator was exchanged for an 8 Fr peel-away sheath under fluoroscopic guidance. The catheter was then placed through the sheath and the sheath was removed. Final catheter positioning was confirmed and documented with a fluoroscopic spot radiograph. The port was accessed with a Huber needle, aspirated, and flushed with heparinized saline. The deep dermal layer of the port pocket incision was closed with interrupted 3-0 Vicryl suture. The skin was opposed with a running subcuticular 4-0 Monocryl suture. Dermabond was then placed over the port pocket and neck incisions. The patient tolerated the procedure well without immediate post procedural complication. FINDINGS: After catheter placement, the tip lies within the superior cavoatrial junction. The catheter aspirates and flushes normally and is ready for immediate use. IMPRESSION: Successful placement of a power injectable Port-A-Cath via the right internal jugular vein. The catheter is ready for immediate use. Ruthann Cancer, MD [facility] Electronically Signed   By: Ruthann Cancer MD   On: 01/04/2021 12:50

## 2021-01-08 NOTE — Assessment & Plan Note (Signed)
She is healed completely from her recent surgery We will proceed with treatment as scheduled I reinforced importance of adequate hydration and regular laxatives to avoid constipation

## 2021-01-08 NOTE — Assessment & Plan Note (Signed)
She has mild sore throat, exam is benign She has no signs of leukocytosis and her home testing kit for COVID-19 is negative I recommend salt water gargle for now We will proceed with treatment without delay

## 2021-01-08 NOTE — Assessment & Plan Note (Signed)
She has started to gain some weight We are aiming for 140 pounds goal weight

## 2021-01-08 NOTE — Patient Instructions (Signed)
[facility] MEDICAL ONCOLOGY  Discharge Instructions: Thank you for choosing [facility] to provide your oncology and hematology care.   If you have a lab appointment with the Cancer Center, please go directly to the Cancer Center and check in at the registration area.   Wear comfortable clothing and clothing appropriate for easy access to any Portacath or PICC line.   We strive to give you quality time with your provider. You may need to reschedule your appointment if you arrive late (15 or more minutes).  Arriving late affects you and other patients whose appointments are after yours.  Also, if you miss three or more appointments without notifying the office, you may be dismissed from the clinic at the provider's discretion.      For prescription refill requests, have your pharmacy contact our office and allow 72 hours for refills to be completed.    Today you received the following chemotherapy and/or immunotherapy agents Carboplatin and Taxol      To help prevent nausea and vomiting after your treatment, we encourage you to take your nausea medication as directed.  BELOW ARE SYMPTOMS THAT SHOULD BE REPORTED IMMEDIATELY: *FEVER GREATER THAN 100.4 F (38 C) OR HIGHER *CHILLS OR SWEATING *NAUSEA AND VOMITING THAT IS NOT CONTROLLED WITH YOUR NAUSEA MEDICATION *UNUSUAL SHORTNESS OF BREATH *UNUSUAL BRUISING OR BLEEDING *URINARY PROBLEMS (pain or burning when urinating, or frequent urination) *BOWEL PROBLEMS (unusual diarrhea, constipation, pain near the anus) TENDERNESS IN MOUTH AND THROAT WITH OR WITHOUT PRESENCE OF ULCERS (sore throat, sores in mouth, or a toothache) UNUSUAL RASH, SWELLING OR PAIN  UNUSUAL VAGINAL DISCHARGE OR ITCHING   Items with * indicate a potential emergency and should be followed up as soon as possible or go to the Emergency Department if any problems should occur.  Please show the CHEMOTHERAPY ALERT CARD or IMMUNOTHERAPY ALERT CARD at  check-in to the Emergency Department and triage nurse.  Should you have questions after your visit or need to cancel or reschedule your appointment, please contact [facility]COL[facility]  Dept: [phone]  and follow the prompts.  Office hours are 8:00 a.m. to 4:30 p.m. Monday - Friday. Please note that voicemails left after 4:00 p.m. may not be returned until the following business day.  We are closed weekends and major holidays. You have access to a nurse at all times for urgent questions. Please call the main number to the clinic Dept: [phone] and follow the prompts.   For any non-urgent questions, you may also contact your provider using MyChart. We now offer e-Visits for anyone 18 and older to request care online for non-urgent symptoms. For details visit [email or web address].   Also download the MyChart app! Go to the app store, search "MyChart", open the app, select [facility], and log in with your MyChart username and password.  Due to Covid, a mask is required upon entering the hospital/clinic. If you do not have a mask, one will be given to you upon arrival. For doctor visits, patients may have 1 support person aged 18 or older with them. For treatment visits, patients cannot have anyone with them due to current Covid guidelines and our immunocompromised population.   

## 2021-01-13 ENCOUNTER — Telehealth: Payer: Self-pay | Admitting: *Deleted

## 2021-01-13 NOTE — Telephone Encounter (Signed)
Patient contacted office. She states her sinuses are congested with minimal drainage and her throat is still  a little sore; occasional non-productive cough; temp elevation one time 99.2 - relieved with Tylenol. She wanted to know if these symptoms might be side effects of her recent chemotherapy. Informed her that these symptoms not usual side effects.   She said she is feeling a little better than when she was in office last Friday. Encouraged to gargle with salt water as advised by Dr. Alvy Bimler last Friday for relief of sore throat pain.  Encouraged her to contact PCP for evaluation, especially if symptoms do not resolve in the next few days.   Also encouraged to let this office know how she is doing, especially if temperature increases and if symptoms do not resolve in next few days. She verbalized understanding.

## 2021-01-28 MED FILL — Fosaprepitant Dimeglumine For IV Infusion 150 MG (Base Eq): INTRAVENOUS | Qty: 5 | Status: AC

## 2021-01-28 MED FILL — Dexamethasone Sodium Phosphate Inj 100 MG/10ML: INTRAMUSCULAR | Qty: 1 | Status: AC

## 2021-01-29 ENCOUNTER — Encounter: Payer: Self-pay | Admitting: Hematology and Oncology

## 2021-01-29 ENCOUNTER — Inpatient Hospital Stay: Payer: Medicare Other | Attending: Hematology and Oncology

## 2021-01-29 ENCOUNTER — Other Ambulatory Visit: Payer: Self-pay

## 2021-01-29 ENCOUNTER — Inpatient Hospital Stay: Payer: Medicare Other | Admitting: Hematology and Oncology

## 2021-01-29 ENCOUNTER — Inpatient Hospital Stay: Payer: Medicare Other

## 2021-01-29 DIAGNOSIS — Z7952 Long term (current) use of systemic steroids: Secondary | ICD-10-CM | POA: Diagnosis not present

## 2021-01-29 DIAGNOSIS — Z79899 Other long term (current) drug therapy: Secondary | ICD-10-CM | POA: Diagnosis not present

## 2021-01-29 DIAGNOSIS — R634 Abnormal weight loss: Secondary | ICD-10-CM

## 2021-01-29 DIAGNOSIS — Z5111 Encounter for antineoplastic chemotherapy: Secondary | ICD-10-CM | POA: Diagnosis present

## 2021-01-29 DIAGNOSIS — Z7901 Long term (current) use of anticoagulants: Secondary | ICD-10-CM | POA: Insufficient documentation

## 2021-01-29 DIAGNOSIS — R197 Diarrhea, unspecified: Secondary | ICD-10-CM

## 2021-01-29 DIAGNOSIS — I82C11 Acute embolism and thrombosis of right internal jugular vein: Secondary | ICD-10-CM | POA: Diagnosis not present

## 2021-01-29 DIAGNOSIS — C482 Malignant neoplasm of peritoneum, unspecified: Secondary | ICD-10-CM | POA: Diagnosis present

## 2021-01-29 DIAGNOSIS — C481 Malignant neoplasm of specified parts of peritoneum: Secondary | ICD-10-CM | POA: Diagnosis not present

## 2021-01-29 LAB — CMP (CANCER CENTER ONLY)
ALT: 12 U/L (ref 0–44)
AST: 12 U/L — ABNORMAL LOW (ref 15–41)
Albumin: 4 g/dL (ref 3.5–5.0)
Alkaline Phosphatase: 73 U/L (ref 38–126)
Anion gap: 13 (ref 5–15)
BUN: 22 mg/dL (ref 8–23)
CO2: 24 mmol/L (ref 22–32)
Calcium: 9.8 mg/dL (ref 8.9–10.3)
Chloride: 105 mmol/L (ref 98–111)
Creatinine: 0.81 mg/dL (ref 0.44–1.00)
GFR, Estimated: >60 mL/min (ref >60)
Glucose, Bld: 175 mg/dL — ABNORMAL HIGH (ref 70–99)
Potassium: 3.7 mmol/L (ref 3.5–5.1)
Sodium: 142 mmol/L (ref 135–145)
Total Bilirubin: 0.4 mg/dL (ref 0.3–1.2)
Total Protein: 7.3 g/dL (ref 6.5–8.1)

## 2021-01-29 LAB — CBC WITH DIFFERENTIAL (CANCER CENTER ONLY)
Abs Immature Granulocytes: 0.09 K/uL — ABNORMAL HIGH (ref 0.00–0.07)
Basophils Absolute: 0 K/uL (ref 0.0–0.1)
Basophils Relative: 0 %
Eosinophils Absolute: 0 K/uL (ref 0.0–0.5)
Eosinophils Relative: 0 %
HCT: 38 % (ref 36.0–46.0)
Hemoglobin: 12.7 g/dL (ref 12.0–15.0)
Immature Granulocytes: 1 %
Lymphocytes Relative: 11 %
Lymphs Abs: 1 K/uL (ref 0.7–4.0)
MCH: 29.7 pg (ref 26.0–34.0)
MCHC: 33.4 g/dL (ref 30.0–36.0)
MCV: 89 fL (ref 80.0–100.0)
Monocytes Absolute: 0.1 K/uL (ref 0.1–1.0)
Monocytes Relative: 1 %
Neutro Abs: 7.8 K/uL — ABNORMAL HIGH (ref 1.7–7.7)
Neutrophils Relative %: 87 %
Platelet Count: 271 K/uL (ref 150–400)
RBC: 4.27 MIL/uL (ref 3.87–5.11)
RDW: 12.7 % (ref 11.5–15.5)
WBC Count: 9.1 K/uL (ref 4.0–10.5)
nRBC: 0 % (ref 0.0–0.2)

## 2021-01-29 MED ORDER — SODIUM CHLORIDE 0.9 % IV SOLN
10.0000 mg | Freq: Once | INTRAVENOUS | Status: AC
  Administered 2021-01-29: 10 mg via INTRAVENOUS
  Filled 2021-01-29: qty 10

## 2021-01-29 MED ORDER — SODIUM CHLORIDE 0.9% FLUSH
10.0000 mL | INTRAVENOUS | Status: DC | PRN
  Administered 2021-01-29: 10 mL

## 2021-01-29 MED ORDER — SODIUM CHLORIDE 0.9 % IV SOLN
150.0000 mg | Freq: Once | INTRAVENOUS | Status: AC
  Administered 2021-01-29: 150 mg via INTRAVENOUS
  Filled 2021-01-29: qty 150

## 2021-01-29 MED ORDER — SODIUM CHLORIDE 0.9 % IV SOLN
175.0000 mg/m2 | Freq: Once | INTRAVENOUS | Status: AC
  Administered 2021-01-29: 288 mg via INTRAVENOUS
  Filled 2021-01-29: qty 48

## 2021-01-29 MED ORDER — SODIUM CHLORIDE 0.9% FLUSH
10.0000 mL | Freq: Once | INTRAVENOUS | Status: AC
  Administered 2021-01-29: 10 mL

## 2021-01-29 MED ORDER — PALONOSETRON HCL INJECTION 0.25 MG/5ML
0.2500 mg | Freq: Once | INTRAVENOUS | Status: AC
  Administered 2021-01-29: 0.25 mg via INTRAVENOUS
  Filled 2021-01-29: qty 5

## 2021-01-29 MED ORDER — SODIUM CHLORIDE 0.9 % IV SOLN
468.6000 mg | Freq: Once | INTRAVENOUS | Status: AC
  Administered 2021-01-29: 470 mg via INTRAVENOUS
  Filled 2021-01-29: qty 47

## 2021-01-29 MED ORDER — FAMOTIDINE 20 MG IN NS 100 ML IVPB
20.0000 mg | Freq: Once | INTRAVENOUS | Status: AC
  Administered 2021-01-29: 20 mg via INTRAVENOUS
  Filled 2021-01-29: qty 100

## 2021-01-29 MED ORDER — HEPARIN SOD (PORK) LOCK FLUSH 100 UNIT/ML IV SOLN
500.0000 [IU] | Freq: Once | INTRAVENOUS | Status: AC | PRN
  Administered 2021-01-29: 500 [IU]

## 2021-01-29 MED ORDER — SODIUM CHLORIDE 0.9 % IV SOLN: Freq: Once | INTRAVENOUS | Status: AC

## 2021-01-29 MED ORDER — DIPHENHYDRAMINE HCL 50 MG/ML IJ SOLN
25.0000 mg | Freq: Once | INTRAMUSCULAR | Status: AC
  Administered 2021-01-29: 25 mg via INTRAVENOUS
  Filled 2021-01-29: qty 1

## 2021-01-29 NOTE — Progress Notes (Signed)
[facility] OFFICE PROGRESS NOTE  Patient Care Team: Algis Greenhouse, MD as PCP - General (Unknown Physician Specialty) Lafonda Mosses, MD as Consulting Physician (Gynecologic Oncology) Heath Lark, MD as Consulting Physician (Hematology and Oncology)  ASSESSMENT & PLAN:  Extraovarian primary peritoneal carcinoma [facility]) She has fully recovered from side effects of recent treatment and infection We will proceed without delay We will call her next week with results of her tumor marker  Diarrhea She had recent diarrhea likely secondary to side effects of antibiotics It is subsiding She can take Imodium as needed  Weight loss, abnormal She had recent weight loss due to recent diarrhea She is eating better Hopefully, she can gain back some weight  No orders of the defined types were placed in this encounter.   All questions were answered. The patient knows to call the clinic with any problems, questions or concerns. The total time spent in the appointment was 20 minutes encounter with patients including review of chart and various tests results, discussions about plan of care and coordination of care plan   Heath Lark, MD 01/29/2021 12:38 PM  INTERVAL HISTORY: Please see below for problem oriented charting. she returns for treatment follow-up cycle 2 of carboplatin and paclitaxel for ovarian cancer She had recent sore throat and infection, resolved She had some diarrhea after antibiotics treatment but that is also improving Denies peripheral neuropathy Her sore throat has resolved She has lost some weight recently due to diarrhea and altered taste sensation  REVIEW OF SYSTEMS:   Constitutional: Denies fevers, chills or abnormal weight loss Eyes: Denies blurriness of vision Ears, nose, mouth, throat, and face: Denies mucositis or sore throat Respiratory: Denies cough, dyspnea or wheezes Cardiovascular: Denies palpitation, chest discomfort or lower extremity  swelling Skin: Denies abnormal skin rashes Lymphatics: Denies new lymphadenopathy or easy bruising Neurological:Denies numbness, tingling or new weaknesses Behavioral/Psych: Mood is stable, no new changes  All other systems were reviewed with the patient and are negative.  I have reviewed the past medical history, past surgical history, social history and family history with the patient and they are unchanged from previous note.  ALLERGIES:  is allergic to ciprofloxacin-ciproflox hcl er, amoxicillin, bupropion, codeine, duloxetine, methylphenidate, other, and latex.  MEDICATIONS:  Current Outpatient Medications  Medication Sig Dispense Refill   alum & mag hydroxide-simeth (MAALOX/MYLANTA) 200-200-20 MG/5ML suspension Take 30 mLs by mouth every 4 (four) hours as needed for up to 6 doses for indigestion or heartburn. 180 mL 0   Apoaequorin (PREVAGEN PO) Take 1 tablet by mouth in the morning.     Cranberry-Vitamin C (CRANBERRY CONCENTRATE/VITAMINC PO) Take 2 tablets by mouth in the morning.     dexamethasone (DECADRON) 4 MG tablet Take 2 tablets the night before and 2 tablets the morning of chemotherapy, every 3 weeks for 6 cycles 36 tablet 6   famotidine (PEPCID) 20 MG tablet Take 1 tablet (20 mg total) by mouth daily. Can take a second dose as needed daily 40 tablet 3   FLUoxetine (PROZAC) 20 MG tablet Take 10 mg by mouth every evening.     mirtazapine (REMERON) 15 MG tablet Take 1 tablet (15 mg total) by mouth at bedtime. (Patient taking differently: Take 15 mg by mouth at bedtime. Takes 1/2 tablet) 30 tablet 3   Omega-3 Fatty Acids (FISH OIL CONCENTRATE PO) Take 1 capsule by mouth 3 (three) times a week.     ondansetron (ZOFRAN) 8 MG tablet Take 1 tablet by mouth  2 times daily as needed for refractory nausea / vomiting. Start on day 3 after carboplatin chemo. 30 tablet 1   Probiotic Product (PROBIOTIC PO) Take 1 capsule by mouth in the morning. OLLY [employer] [place] Probiotic Capsules      prochlorperazine (COMPAZINE) 10 MG tablet Take 1 tablet (10 mg total) by mouth every 6 (six) hours as needed (Nausea or vomiting). 30 tablet 1   senna-docusate (SENOKOT-S) 8.6-50 MG tablet Take 2 tablets by mouth at bedtime. For AFTER surgery, do not take if having diarrhea 30 tablet 0   traMADol (ULTRAM) 50 MG tablet Take 1 tablet (50 mg total) by mouth every 6 (six) hours as needed for severe pain. For AFTER surgery pain only, do not take and drive 10 tablet 0   No current facility-administered medications for this visit.   Facility-Administered Medications Ordered in Other Visits  Medication Dose Route Frequency Provider Last Rate Last Admin   CARBOplatin (PARAPLATIN) 470 mg in sodium chloride 0.9 % 250 mL chemo infusion  470 mg Intravenous Once Alvy Bimler, Tiondra Fang, MD       heparin lock flush 100 unit/mL  500 Units Intracatheter Once PRN Alvy Bimler, Chayanne Speir, MD       PACLitaxel (TAXOL) 288 mg in sodium chloride 0.9 % 250 mL chemo infusion (> $RemoveBef'80mg'RfFuQttHDD$ /m2)  175 mg/m2 (Treatment Plan Recorded) Intravenous Once Alvy Bimler, Teodora Baumgarten, MD       sodium chloride flush (NS) 0.9 % injection 10 mL  10 mL Intracatheter PRN Heath Lark, MD        SUMMARY OF ONCOLOGIC HISTORY: Oncology History Overview Note  Low grade serous [facility] One done on 8/9: HRD not detected, MSI stable, low tumor mutational burden 1 Muts/Mb, no alterations of BRCA1 & 2   Extraovarian primary peritoneal carcinoma ([facility])  09/29/2020 Imaging   MRI lumbar spine The dominant finding in this case is that of massive retroperitoneal lymphadenopathy, likely related to either metastatic disease or lymphoma.   Chronic degenerative and postoperative findings of the spine as outlined in detail above   10/05/2020 Imaging   IMPRESSION outside CT 1. Large calcified retroperitoneal lymph nodes highly concerning for metastatic disease, likely related to primary carcinoma of the colon or lymphoma. Clinical correlation is recommended. 2. No bowel obstruction. Normal  appendix. 3. Stable right hepatic dome hemangioma.   10/30/2020 PET scan   1. The densely calcified retroperitoneal adenopathy is highly hypermetabolic with maximum SUV of 23.4 (Deauville 5) there is also Deauville 3 activity in a small right axillary lymph node. Given the lack of an obvious primary outside of the retroperitoneum, appearance tends to favor lymphoma or Castleman's disease. Tissue diagnosis suggested. 2. Other imaging findings of potential clinical significance: Chronic left maxillary sinusitis. Aortic Atherosclerosis (ICD10-I70.0). Coronary atherosclerosis.   11/10/2020 Procedure   CT guided biopsy of retroperitoneal adenopathy   11/10/2020 Pathology Results   biopsy results which favor a low-grade serous carcinoma of gyn origin. This is a somewhat atypical presentation, but my suspicion is that it is primary peritoneal carcinoma   11/16/2020 Tumor Marker   Patient's tumor was tested for the following markers: CA-125. Results of the tumor marker test revealed 96.4.   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 UTERUS, CERVIX, BILATERAL FALLOPIAN TUBES AND OVARIES:  Uterus:  -  Inactive endometrium  -  Leiomyoma (1.1 cm; largest)  -  Calcified nodule of the broad ligament with psammoma bodies (right)  -  No hyperplasia or malignancy identified   Cervix:  -  Benign cervix  -  No dysplasia or malignancy identified   Ovaries, bilateral:  -  Serous cyst adenofibroma with microscopic focus of borderline change  (right)    Fallopian tubes, bilateral:  -  Benign fallopian tube with paratubal cyst (right)  -  No malignancy identified   B. OMENTUM, OMENTECTOMY:  -  No carcinoma identified   C. PERIAORTIC TUMOR, EXCISION:  -  Serous carcinoma, low-grade, with psammoma  bodies  -  See comment below   COMMENT:   C.  At the request of the treating clinician, immunohistochemistry was repeated.  The neoplastic cells are positive for ER, cytokeratin 7, cytokeratin 5/6, p53, PAX8 and WT1 but negative for cytokeratin 20,  TTF-1, PR and thyroglobulin.  The morphology and immunophenotype are consistent with a primary peritoneal low-grade serous carcinoma (see also, [id number]).   ONCOLOGY TABLE:   OVARY or FALLOPIAN TUBE or PRIMARY PERITONEUM: Resection   Procedure: Total hysterectomy and bilateral salpingo-oophorectomy with omentectomy and periaortic tumor excision  Specimen Integrity: Intact  Tumor Site: Primary peritoneum (see comment)  Tumor Size: See comment  Histologic Type: Low-grade serous carcinoma  Histologic Grade: Low-grade  Ovarian Surface Involvement: Not identified  Fallopian Tube Surface Involvement: Not identified  Implants (required for advanced stage serous/seromucinous borderline  tumors only): Not applicable  Other Tissue/ Organ Involvement: Periaortic tumor  Largest Extrapelvic Peritoneal Focus: 5.5 cm (aggregate measurement)  Peritoneal/Ascitic Fluid Involvement: Not applicable  Chemotherapy Response Score (CRS): Not applicable, no known presurgical therapy  Regional Lymph Nodes: Not applicable (no lymph nodes submitted or found)  Distant Metastasis:       Distant Site(s) Involved: Not applicable  Pathologic Stage Classification (pTNM, AJCC 8th Edition): pT3, pN not assigned  Ancillary Studies: Can be performed upon request  Representative Tumor Block: C1  Comment(s): The only focus of low-grade serous carcinoma identified in the resection specimen is the tissue submitted as periaortic tumor (part C).  Clinically, this was favored to represent matted lymph nodes; however, there is no evidence of nodal tissue in the examined material. This case was discussed with Dr. Berline Lopes and given the extent of the matted lymph nodes, this is being  staged as a pT3.     12/01/2020 Surgery   Pre-operative Diagnosis: low grade serous carcinoma with significant retroperitoneal adenopathy, no other obvious imaging evidence of disease   Post-operative Diagnosis: same, significantly adherent presacral and para-aortic lymphadenopathy   Operation: Robotic-assisted laparoscopic total hysterectomy with bilateral salpingo-oophorectomy, laparotomy, infra-colic omentectomy, portion of necrotic tumor-ridden retroperitoneal lymph nodes, oversew of sigmoid serosa   Surgeon: Jeral Pinch MD   Assistant Surgeon: Lahoma Crocker MD (an MD assistant was necessary for tissue manipulation, management of robotic instrumentation, retraction and positioning due to the complexity of the case and hospital policies).    Intra-operative consultant:  Everitt Amber MD    Operative Findings: On EUA, small mobile uterus. ON intra-abdominal entry, normal liver and diaphragm. One filmy adhesion noted between the right liver lobe and the anterior abdominal wall. The spleen was somewhat lateral and appeared mildly enlarged. There were some adhesions of the infragastric omentum to the anterior abdominal wall in the midline. The omentum was normal appearing without obvious evidence of tumor. Small and large bowel were normal appearing, appendix normal. Uterus 6cm and normal in appearance. Somewhat atrophic bilateral adnexa.  There were filmy adhesions between each adnexa and the pelvic sidewall. Some filmy adhesions were also noted between the posterior uterus and rectum. Approximately 0.5-1cm white nodule within the broad ligament, lateral to the right cornua, c/w possible fibroma vs fibroid. No pelvic adenopathy. Significantly enlarged, necrotic, and tumor-filled lymph node conglomeration was noted starting within the presacral space (adherent to the sacral promontory) and extending superiorly along the great vessels to the level of the renal vessel (suspected). Given adherence  of the involved lymph nodes, I could not visualize this anatomy, I could long palpate along each lateral aspect of the aorta. There was no identifiable plane between matted lymph nodes and underlying aorta or IVC. Given burden of disease and my concern that resection was not feasible, I asked my senior partner (Dr. Denman George) to evaluate intra-op. She agreed that moving forward with attempt at debulking involved lymph nodes was likely not feasible and an attempt at this time would be unsafe.   There was no obvious peritoneal disease and no clear involvement of gyn organs. Despite prior biopsy, findings at surgery remain concerning for a non-gynecologic process. If this is confirmed to be low-grade serous carcinoma of gyn origin, this was an R2 resection.    12/15/2020 Initial Diagnosis   Extraovarian primary peritoneal carcinoma ([facility])   12/15/2020 Cancer Staging   Staging form: Ovary, AJCC 7th Edition - Pathologic stage from 12/15/2020: Stage IIIC (T3c, N0, cM0) - Signed by Heath Lark, MD on 12/15/2020 Staged by: Managing physician Stage prefix: Initial diagnosis   01/04/2021 Procedure   Successful placement of a power injectable Port-A-Cath via the right internal jugular vein. The catheter is ready for immediate use.   01/08/2021 -  Chemotherapy    Patient is on Treatment Plan: OVARIAN CARBOPLATIN (AUC 6) / PACLITAXEL (175) Q21D X 6 CYCLES         PHYSICAL EXAMINATION: ECOG PERFORMANCE STATUS: 1 - Symptomatic but completely ambulatory  Vitals:   01/29/21 1027  BP: (!) 148/81  Pulse: 90  Resp: 18  Temp: 98.2 F (36.8 C)  SpO2: 99%   Filed Weights   01/29/21 1027  Weight: 136 lb (61.7 kg)    GENERAL:alert, no distress and comfortable SKIN: skin color, texture, turgor are normal, no rashes or significant lesions EYES: normal, Conjunctiva are pink and non-injected, sclera clear OROPHARYNX:no exudate, no erythema and lips, buccal mucosa, and tongue normal  NECK: supple, thyroid normal  size, non-tender, without nodularity LYMPH:  no palpable lymphadenopathy in the cervical, axillary or inguinal LUNGS: clear to auscultation and percussion with normal breathing effort HEART: regular rate & rhythm and no murmurs and no lower extremity edema ABDOMEN:abdomen soft, non-tender and normal bowel sounds Musculoskeletal:no cyanosis of digits and no clubbing  NEURO: alert & oriented x 3 with fluent speech, no focal motor/sensory deficits  LABORATORY DATA:  I have reviewed the data as listed    Component Value Date/Time   NA 142 01/29/2021 1003   K 3.7 01/29/2021 1003   CL 105 01/29/2021 1003   CO2 24 01/29/2021 1003   GLUCOSE 175 (H) 01/29/2021 1003   BUN 22 01/29/2021 1003   CREATININE 0.81 01/29/2021 1003   CALCIUM 9.8 01/29/2021 1003   PROT 7.3 01/29/2021 1003   ALBUMIN 4.0 01/29/2021 1003   AST 12 (L) 01/29/2021 1003   ALT 12 01/29/2021 1003   ALKPHOS 73 01/29/2021 1003   BILITOT 0.4 01/29/2021 1003   GFRNONAA >60 01/29/2021 1003    No results found for: SPEP, UPEP  Lab Results  Component Value Date   WBC 9.1 01/29/2021   NEUTROABS 7.8 (H) 01/29/2021   HGB 12.7 01/29/2021   HCT 38.0 01/29/2021   MCV 89.0 01/29/2021   PLT 271 01/29/2021      Chemistry      Component Value Date/Time   NA 142 01/29/2021 1003   K 3.7 01/29/2021 1003   CL 105 01/29/2021 1003   CO2 24 01/29/2021 1003   BUN 22 01/29/2021 1003   CREATININE 0.81 01/29/2021 1003      Component Value Date/Time   CALCIUM 9.8 01/29/2021 1003   ALKPHOS 73 01/29/2021 1003   AST 12 (L) 01/29/2021 1003   ALT 12 01/29/2021 1003   BILITOT 0.4 01/29/2021 1003

## 2021-01-29 NOTE — Assessment & Plan Note (Signed)
She has fully recovered from side effects of recent treatment and infection We will proceed without delay We will call her next week with results of her tumor marker

## 2021-01-29 NOTE — Patient Instructions (Signed)
[facility] MEDICAL [facility]COLOGY  Discharge Instructions: Thank you for choosing [facility] to provide your oncology and hematology care.   If you have a lab appointment with the Cancer Center, please go directly to the Cancer Center and check in at the registration area.   Wear comfortable clothing and clothing appropriate for easy access to any Portacath or PICC line.   We strive to give you quality time with your provider. You may need to reschedule your appointment if you arrive late (15 or more minutes).  Arriving late affects you and other patients whose appointments are after yours.  Also, if you miss three or more appointments without notifying the office, you may be dismissed from the clinic at the provider's discretion.      For prescription refill requests, have your pharmacy contact our office and allow 72 hours for refills to be completed.    Today you received the following chemotherapy and/or immunotherapy agents Taxol and Carboplatin       To help prevent nausea and vomiting after your treatment, we encourage you to take your nausea medication as directed.  BELOW ARE SYMPTOMS THAT SHOULD BE REPORTED IMMEDIATELY: *FEVER GREATER THAN 100.4 F (38 C) OR HIGHER *CHILLS OR SWEATING *NAUSEA AND VOMITING THAT IS NOT CONTROLLED WITH YOUR NAUSEA MEDICATION *UNUSUAL SHORTNESS OF BREATH *UNUSUAL BRUISING OR BLEEDING *URINARY PROBLEMS (pain or burning when urinating, or frequent urination) *BOWEL PROBLEMS (unusual diarrhea, constipation, pain near the anus) TENDERNESS IN MOUTH AND THROAT WITH OR WITHOUT PRESENCE OF ULCERS (sore throat, sores in mouth, or a toothache) UNUSUAL RASH, SWELLING OR PAIN  UNUSUAL VAGINAL DISCHARGE OR ITCHING   Items with * indicate a potential emergency and should be followed up as soon as possible or go to the Emergency Department if any problems should occur.  Please show the CHEMOTHERAPY ALERT CARD or IMMUNOTHERAPY ALERT CARD at  check-in to the Emergency Department and triage nurse.  Should you have questions after your visit or need to cancel or reschedule your appointment, please contact [facility]  Dept: [phone]  and follow the prompts.  Office hours are 8:00 a.m. to 4:30 p.m. Monday - Friday. Please note that voicemails left after 4:00 p.m. may not be returned until the following business day.  We are closed weekends and major holidays. You have access to a nurse at all times for urgent questions. Please call the main number to the clinic Dept: [phone] and follow the prompts.   For any non-urgent questions, you may also contact your provider using MyChart. We now offer e-Visits for anyone 18 and older to request care online for non-urgent symptoms. For details visit [email or web address].   Also download the MyChart app! Go to the app store, search "MyChart", open the app, select [facility], and log in with your MyChart username and password.  Due to Covid, a mask is required upon entering the hospital/clinic. If you do not have a mask, one will be given to you upon arrival. For doctor visits, patients may have 1 support person aged 18 or older with them. For treatment visits, patients cannot have anyone with them due to current Covid guidelines and our immunocompromised population.   

## 2021-01-29 NOTE — Assessment & Plan Note (Signed)
She had recent weight loss due to recent diarrhea She is eating better Hopefully, she can gain back some weight

## 2021-01-29 NOTE — Assessment & Plan Note (Signed)
She had recent diarrhea likely secondary to side effects of antibiotics It is subsiding She can take Imodium as needed

## 2021-01-30 LAB — CA 125: Cancer Antigen (CA) 125: 59 U/mL — ABNORMAL HIGH (ref 0.0–38.1)

## 2021-02-02 ENCOUNTER — Telehealth: Payer: Self-pay

## 2021-02-02 NOTE — Telephone Encounter (Signed)
Called and given below message. Told Dr. Alvy Bimler is planning on ordering a CT scan after cycle 3.She verbalized understanding.   She is complaining of constipation. No bm since Thursday. She has been taking stool softeners bid only. She is able to eat and drink. She is eating 5-6 small meals a day.  Instructed to take Miralax BID and senokot 2 tablets TID. She verbalized understanding and will call back in the am with update.

## 2021-02-02 NOTE — Telephone Encounter (Signed)
-----   Message from Heath Lark, MD sent at 02/02/2021  8:23 AM EDT ----- Pls let her know CA-125 is better

## 2021-02-03 ENCOUNTER — Telehealth: Payer: Self-pay

## 2021-02-03 NOTE — Telephone Encounter (Signed)
Returned her call. She had a bm yesterday and feels better. She will continue to take Senokot and Miralax daily. No problems eating/ drinking.  She noticed today that she has some swelling on the side of her neck on the same side as her port. Denies redness and fever.  Instructed to send a picture on mychart of her neck. She verbalized understanding.

## 2021-02-04 ENCOUNTER — Encounter: Payer: Self-pay | Admitting: Hematology and Oncology

## 2021-02-05 ENCOUNTER — Other Ambulatory Visit: Payer: Self-pay

## 2021-02-05 ENCOUNTER — Emergency Department (HOSPITAL_BASED_OUTPATIENT_CLINIC_OR_DEPARTMENT_OTHER)
Admit: 2021-02-05 | Discharge: 2021-02-05 | Disposition: A | Discharge status: Home / Self Care | Payer: Medicare Other | Attending: Emergency Medicine | Admitting: Emergency Medicine

## 2021-02-05 ENCOUNTER — Telehealth: Payer: Self-pay

## 2021-02-05 ENCOUNTER — Emergency Department (HOSPITAL_COMMUNITY)
Admission: EM | Admit: 2021-02-05 | Discharge: 2021-02-05 | Disposition: A | Discharge status: Home / Self Care | Payer: Medicare Other | Attending: Emergency Medicine | Admitting: Emergency Medicine

## 2021-02-05 ENCOUNTER — Encounter (HOSPITAL_COMMUNITY): Payer: Self-pay

## 2021-02-05 DIAGNOSIS — Z85828 Personal history of other malignant neoplasm of skin: Secondary | ICD-10-CM | POA: Diagnosis not present

## 2021-02-05 DIAGNOSIS — Z91040 Latex allergy status: Secondary | ICD-10-CM | POA: Insufficient documentation

## 2021-02-05 DIAGNOSIS — R221 Localized swelling, mass and lump, neck: Secondary | ICD-10-CM | POA: Diagnosis not present

## 2021-02-05 DIAGNOSIS — M79601 Pain in right arm: Secondary | ICD-10-CM | POA: Diagnosis not present

## 2021-02-05 DIAGNOSIS — M7989 Other specified soft tissue disorders: Secondary | ICD-10-CM

## 2021-02-05 DIAGNOSIS — I8290 Acute embolism and thrombosis of unspecified vein: Secondary | ICD-10-CM

## 2021-02-05 DIAGNOSIS — I82621 Acute embolism and thrombosis of deep veins of right upper extremity: Secondary | ICD-10-CM | POA: Insufficient documentation

## 2021-02-05 MED ORDER — APIXABAN (ELIQUIS) VTE STARTER PACK (10MG AND 5MG): ORAL_TABLET | ORAL | 0 refills | Status: DC

## 2021-02-05 MED ORDER — APIXABAN 5 MG PO TABS
10.0000 mg | ORAL_TABLET | Freq: Once | ORAL | Status: AC
  Administered 2021-02-05: 10 mg via ORAL
  Filled 2021-02-05: qty 2

## 2021-02-05 MED ORDER — APIXABAN (ELIQUIS) EDUCATION KIT FOR DVT/PE PATIENTS
PACK | Freq: Once | Status: DC
  Filled 2021-02-05: qty 1

## 2021-02-05 NOTE — Discharge Instructions (Addendum)
Start taking the blood thinner.  Dr. Alvy Bimler will call you and may be able to answer any other questions you may have.  Tylenol as needed for pain.  Information on my medicine - ELIQUIS (apixaban)  This medication education was reviewed with me or my healthcare representative as part of my discharge preparation.  The pharmacist that spoke with me during my hospital stay was:  Darcel Smalling, Student-PharmD  Why was Eliquis prescribed for you? Eliquis was prescribed to treat blood clots that may have been found in the veins of your legs (deep vein thrombosis) or in your lungs (pulmonary embolism) and to reduce the risk of them occurring again.  What do You need to know about Eliquis ? The starting dose is 10 mg (two 5 mg tablets) taken TWICE daily for the FIRST SEVEN (7) DAYS, then on (enter date)  02/12/2021  the dose is reduced to ONE 5 mg tablet taken TWICE daily.  Eliquis may be taken with or without food.   Try to take the dose about the same time in the morning and in the evening. If you have difficulty swallowing the tablet whole please discuss with your pharmacist how to take the medication safely.  Take Eliquis exactly as prescribed and DO NOT stop taking Eliquis without talking to the doctor who prescribed the medication.  Stopping may increase your risk of developing a new blood clot.  Refill your prescription before you run out.  After discharge, you should have regular check-up appointments with your healthcare provider that is prescribing your Eliquis.    What do you do if you miss a dose? If a dose of ELIQUIS is not taken at the scheduled time, take it as soon as possible on the same day and twice-daily administration should be resumed. The dose should not be doubled to make up for a missed dose.  Important Safety Information A possible side effect of Eliquis is bleeding. You should call your healthcare provider right away if you experience any of the following: Bleeding  from an injury or your nose that does not stop. Unusual colored urine (red or dark brown) or unusual colored stools (red or black). Unusual bruising for unknown reasons. A serious fall or if you hit your head (even if there is no bleeding).  Some medicines may interact with Eliquis and might increase your risk of bleeding or clotting while on Eliquis. To help avoid this, consult your healthcare provider or pharmacist prior to using any new prescription or non-prescription medications, including herbals, vitamins, non-steroidal anti-inflammatory drugs (NSAIDs) and supplements.  This website has more information on Eliquis (apixaban): http://[email or web address].com/eliquis/home

## 2021-02-05 NOTE — ED Provider Notes (Signed)
[facility] DEPT Provider Note   CSN: [id number] Arrival date & time: 02/05/21  1044     History Chief Complaint  Patient presents with   Pain and swelling in Neck    [name] [name] is a 65 y.o. female.  Is a 64 year old female with a history of papillary serous tumor and extraovarian peritoneal carcinoma currently on chemotherapy presenting today with 3 to 4 days of pain in the right side of her neck and swelling.  It has gradually been getting worse and now the pain is radiating into her right shoulder and midway into her arm.  She has not noticed any difficulty swallowing, breathing.  She has not had any fever.  The area has not been red or draining.  She last received chemotherapy and had her port accessed on Friday and there were no issues.  She had no symptoms at that time.  Because the symptoms are worsening she spoke with her doctor and they recommended coming for evaluation.  She takes no blood thinners at this time.  No prior history of clots.  She reports most pain medication makes her sick and Tylenol does not really work so she is just been dealing with the pain.  No recent procedures. Of note she also noted that she had been constipated lately and took 6 laxative yesterday and it is now starting to kick in and she is having frequent stools.  The history is provided by the patient and medical records.      Past Medical History:  Diagnosis Date   Arthritis    Chronic pain    Contact lens/glasses fitting    wears contacts or glasses   Depression    Fibromyalgia    Melanoma ([place])    Peritoneal carcinoma ([place])    PONV (postoperative nausea and vomiting)    prolonged sedation very hard to wake    Patient Active Problem List   Diagnosis Date Noted   Extraovarian primary peritoneal carcinoma ([place]) 12/15/2020   Weight loss, abnormal 12/15/2020   Diarrhea 12/15/2020   Pelvic mass in female 12/01/2020   Adenopathy 11/24/2020   Papillary  serous tumor of low malignant potential, unspecified laterality ([place]) 11/16/2020   History of lumbar surgery 12/26/2018   Lower abdominal pain 12/04/2018   Mixed hyperlipidemia 02/05/2018   Elevated blood pressure reading 01/26/2016   Chronic low back pain 11/03/2015   Fibromyositis 11/03/2015   Irritable bowel syndrome 11/03/2015   Recurrent major depressive disorder, in remission ([place]) 11/03/2015   Chronic pain syndrome 06/26/2012    Past Surgical History:  Procedure Laterality Date   BACK SURGERY     lumb lam   BREAST BIOPSY Right    2021   BREAST BIOPSY Left    BREAST ENHANCEMENT SURGERY  05/30/1980   implants    BREAST IMPLANT EXCHANGE  05/30/2009   CARPAL TUNNEL RELEASE  05/30/2001   rt and lt   CARPOMETACARPEL SUSPENSION PLASTY  06/22/2012   Procedure: CARPOMETACARPEL ([place]) SUSPENSION PLASTY;  Surgeon: Alta Corning, MD;  Location: [facility];  Service: Orthopedics;  Laterality: Left;  burton's interposition arthroplasty left thumb    CATARACT EXTRACTION W/ INTRAOCULAR LENS  IMPLANT, BILATERAL     COLONOSCOPY     x2   DIAGNOSTIC LAPAROSCOPY  05/30/2000   expl lap   DILATION AND CURETTAGE OF UTERUS  05/30/2001   ablation   ELBOW SURGERY  05/31/1999   decompression rt   EPIDURAL BLOCK INJECTION  multiple   IR IMAGING GUIDED PORT INSERTION  01/04/2021   LYMPH NODE DISSECTION N/A 12/01/2020   Procedure: LYMPH NODE DISSECTION;  Surgeon: Lafonda Mosses, MD;  Location: [facility];  Service: Gynecology;  Laterality: N/A;   SKIN CANCER EXCISION     melanoma, from abdomen   TONSILLECTOMY       OB History     Gravida  1   Para  1   Term      Preterm      AB      Living         SAB      IAB      Ectopic      Multiple      Live Births              Family History  Problem Relation Age of Onset   Lung cancer Father    Colon cancer Neg Hx    Breast cancer Neg Hx    Ovarian cancer Neg Hx    Endometrial cancer Neg Hx    Pancreatic  cancer Neg Hx    Prostate cancer Neg Hx     Social History   Tobacco Use   Smoking status: Never   Smokeless tobacco: Never  Vaping Use   Vaping Use: Never used  Substance Use Topics   Alcohol use: Yes    Comment: rare   Drug use: No    Home Medications Prior to Admission medications   Medication Sig Start Date End Date Taking? Authorizing Provider  alum & mag hydroxide-simeth (MAALOX/MYLANTA) 200-200-20 MG/5ML suspension Take 30 mLs by mouth every 4 (four) hours as needed for up to 6 doses for indigestion or heartburn. 12/03/20   Lafonda Mosses, MD  Apoaequorin (PREVAGEN PO) Take 1 tablet by mouth in the morning.    [provider]  Cranberry-Vitamin C (CRANBERRY CONCENTRATE/VITAMINC PO) Take 2 tablets by mouth in the morning.    [provider]  dexamethasone (DECADRON) 4 MG tablet Take 2 tablets the night before and 2 tablets the morning of chemotherapy, every 3 weeks for 6 cycles 12/15/20   Heath Lark, MD  famotidine (PEPCID) 20 MG tablet Take 1 tablet (20 mg total) by mouth daily. Can take a second dose as needed daily 12/07/20 05/16/21  Lafonda Mosses, MD  FLUoxetine (PROZAC) 20 MG tablet Take 10 mg by mouth every evening. 09/24/20   [provider]  mirtazapine (REMERON) 15 MG tablet Take 1 tablet (15 mg total) by mouth at bedtime. Patient taking differently: Take 15 mg by mouth at bedtime. Takes 1/2 tablet 12/15/20   Heath Lark, MD  Omega-3 Fatty Acids (FISH OIL CONCENTRATE PO) Take 1 capsule by mouth 3 (three) times a week.    [provider]  ondansetron (ZOFRAN) 8 MG tablet Take 1 tablet by mouth 2 times daily as needed for refractory nausea / vomiting. Start on day 3 after carboplatin chemo. 12/15/20   Heath Lark, MD  Probiotic Product (PROBIOTIC PO) Take 1 capsule by mouth in the morning. OLLY [facility] [place] [facility] Probiotic Capsules    [provider]  prochlorperazine (COMPAZINE) 10 MG tablet Take 1 tablet (10 mg total) by  mouth every 6 (six) hours as needed (Nausea or vomiting). 12/15/20   Heath Lark, MD  senna-docusate (SENOKOT-S) 8.6-50 MG tablet Take 2 tablets by mouth at bedtime. For AFTER surgery, do not take if having diarrhea 11/26/20   Dorothyann Gibbs, NP  traMADol Veatrice [place]bon)  50 MG tablet Take 1 tablet (50 mg total) by mouth every 6 (six) hours as needed for severe pain. For AFTER surgery pain only, do not take and drive [id number]   Joylene John D, NP    Allergies    Ciprofloxacin-ciproflox hcl er, Amoxicillin, Bupropion, Codeine, Duloxetine, Methylphenidate, Other, and Latex  Review of Systems   Review of Systems  All other systems reviewed and are negative.  Physical Exam Updated Vital Signs BP 133/87 (BP Location: Left Arm)   Pulse 82   Temp 98.4 F (36.9 C) (Oral)   Resp 16   SpO2 100%   Physical Exam Vitals and nursing note reviewed.  Constitutional:      General: She is in acute distress.     Appearance: She is well-developed.  HENT:     Head: Normocephalic and atraumatic.  Eyes:     Pupils: Pupils are equal, round, and reactive to light.  Neck:   Cardiovascular:     Rate and Rhythm: Normal rate and regular rhythm.     Pulses: Normal pulses.     Heart sounds: Normal heart sounds. No murmur heard.   No friction rub.  Pulmonary:     Effort: Pulmonary effort is normal.     Breath sounds: Normal breath sounds. No wheezing or rales.     Comments: Port present in the right upper chest Abdominal:     General: Bowel sounds are normal. There is no distension.     Palpations: Abdomen is soft.     Tenderness: There is no abdominal tenderness. There is no guarding or rebound.  Musculoskeletal:        General: No tenderness. Normal range of motion.     Cervical back: Normal range of motion and neck supple.     Comments: No edema.   Full range of motion of the right shoulder without difficulty.  No reproducible pain with palpation of the shoulder.  2+ radial pulse on the right.  Skin:     General: Skin is warm and dry.     Findings: No rash.  Neurological:     Mental Status: She is alert and oriented to person, place, and time. Mental status is at baseline.     Cranial Nerves: No cranial nerve deficit.  Psychiatric:        Mood and Affect: Mood normal.        Behavior: Behavior normal.    ED Results / Procedures / Treatments   Labs (all labs ordered are listed, but only abnormal results are displayed) Labs Reviewed  CBC WITH DIFFERENTIAL/PLATELET  BASIC METABOLIC PANEL    EKG None  Radiology No results found.  Procedures Procedures   Medications Ordered in ED Medications - No data to display  ED Course  I have reviewed the triage vital signs and the nursing notes.  Pertinent labs & imaging results that were available during my care of the patient were reviewed by me and considered in my medical decision making (see chart for details).    MDM Rules/Calculators/A&P                           Patient presenting today with worsening swelling in the right anterior neck.  This has been ongoing for 3 to 4 days.  Denies any numbness or weakness but pain is shooting into the arm.  No evidence of infection at this time and patient denies any infectious symptoms.  It is on  the same side as her port and there is concern for possible clot.  It was accessed on Friday and there were no issues.  There was no swelling at that time either.  We will get an ultrasound to start to evaluate for IJ DVT.  11:59 AM Pt with an IJ clot today without extension into the sublavian or axillary.  Does not seem to extend down to the port.  Spoke with Dr. Alvy Bimler and will start eliquis and will send a ppx to the pharmacy.  Final Clinical Impression(s) / ED Diagnoses Final diagnoses:  Acute deep vein thrombosis (DVT) of non-extremity vein    Rx / DC Orders ED Discharge Orders          Ordered    APIXABAN (ELIQUIS) VTE STARTER PACK ('10MG'$  AND '5MG'$ )        02/05/21 1217              Blanchie Dessert, MD 02/05/21 1219

## 2021-02-05 NOTE — ED Triage Notes (Addendum)
Pt arrived via POV, c/o pain and swelling in neck x3 days, concerned there is a blood clot.

## 2021-02-05 NOTE — Progress Notes (Signed)
Right upper extremity venous duplex has been completed. Preliminary results can be found in CV Proc through chart review.  Results were given to Dr. Maryan Rued.  02/05/21 12:17 PM Carlos Levering RVT

## 2021-02-05 NOTE — ED Notes (Signed)
Pharmacist came up, gave pt a card, explained new medication, patient has no questions at this time.

## 2021-02-05 NOTE — Telephone Encounter (Signed)
Returned her call. She is still having swelling to her right side of her neck, the same side as her port. She did not send a picture on mychart because a picture did not capture the swelling. Swelling is now going down her arm/ shoulder. The area is very sore. Denies fever and redness.  Per Dr. Alvy Bimler instructed to go to ED to be evaluated. She verbalized understanding and will go to [facility] ED.

## 2021-02-08 ENCOUNTER — Telehealth: Payer: Self-pay

## 2021-02-08 NOTE — Telephone Encounter (Signed)
-----   Message from Heath Lark, MD sent at 02/08/2021  7:34 AM EDT ----- Can you call and check on her? Is her swelling better? Did she has problem getting eliquis?

## 2021-02-08 NOTE — Telephone Encounter (Signed)
Called and given below message. She verbalized understanding.  Swelling is less and she is taking the Eliquis as prescribed. She will call the office back if needed.  At next appt she wants to talk about the knee pain that she has after chemo, starts on 2 nd day. She takes tylenol at times and it does not seem to help. She is unable to take Tramadol. She is still having some soreness to her knees. Instructed to take tylenol as needed and call the office back if needed. She verbalized understanding.

## 2021-02-18 MED FILL — Dexamethasone Sodium Phosphate Inj 100 MG/10ML: INTRAMUSCULAR | Qty: 1 | Status: AC

## 2021-02-18 MED FILL — Fosaprepitant Dimeglumine For IV Infusion 150 MG (Base Eq): INTRAVENOUS | Qty: 5 | Status: AC

## 2021-02-19 ENCOUNTER — Other Ambulatory Visit: Payer: Self-pay

## 2021-02-19 ENCOUNTER — Other Ambulatory Visit (HOSPITAL_COMMUNITY): Payer: Self-pay

## 2021-02-19 ENCOUNTER — Encounter: Payer: Self-pay | Admitting: Hematology and Oncology

## 2021-02-19 ENCOUNTER — Inpatient Hospital Stay: Payer: Medicare Other

## 2021-02-19 ENCOUNTER — Inpatient Hospital Stay: Payer: Medicare Other | Admitting: Hematology and Oncology

## 2021-02-19 VITALS — BP 150/82 | HR 84 | Temp 97.5°F | Resp 18 | Ht 63.0 in | Wt 136.6 lb

## 2021-02-19 DIAGNOSIS — G4700 Insomnia, unspecified: Secondary | ICD-10-CM | POA: Diagnosis not present

## 2021-02-19 DIAGNOSIS — C481 Malignant neoplasm of specified parts of peritoneum: Secondary | ICD-10-CM | POA: Diagnosis not present

## 2021-02-19 DIAGNOSIS — Z5111 Encounter for antineoplastic chemotherapy: Secondary | ICD-10-CM | POA: Diagnosis not present

## 2021-02-19 DIAGNOSIS — K5909 Other constipation: Secondary | ICD-10-CM

## 2021-02-19 DIAGNOSIS — I82C11 Acute embolism and thrombosis of right internal jugular vein: Secondary | ICD-10-CM

## 2021-02-19 LAB — CBC WITH DIFFERENTIAL (CANCER CENTER ONLY)
Abs Immature Granulocytes: 0.08 10*3/uL — ABNORMAL HIGH (ref 0.00–0.07)
Basophils Absolute: 0 10*3/uL (ref 0.0–0.1)
Basophils Relative: 0 %
Eosinophils Absolute: 0 10*3/uL (ref 0.0–0.5)
Eosinophils Relative: 0 %
HCT: 36.8 % (ref 36.0–46.0)
Hemoglobin: 13.1 g/dL (ref 12.0–15.0)
Immature Granulocytes: 1 %
Lymphocytes Relative: 8 %
Lymphs Abs: 1 10*3/uL (ref 0.7–4.0)
MCH: 31.1 pg (ref 26.0–34.0)
MCHC: 35.6 g/dL (ref 30.0–36.0)
MCV: 87.4 fL (ref 80.0–100.0)
Monocytes Absolute: 0.2 10*3/uL (ref 0.1–1.0)
Monocytes Relative: 2 %
Neutro Abs: 10.9 10*3/uL — ABNORMAL HIGH (ref 1.7–7.7)
Neutrophils Relative %: 89 %
Platelet Count: 262 10*3/uL (ref 150–400)
RBC: 4.21 MIL/uL (ref 3.87–5.11)
RDW: 13.5 % (ref 11.5–15.5)
WBC Count: 12.2 10*3/uL — ABNORMAL HIGH (ref 4.0–10.5)
nRBC: 0 % (ref 0.0–0.2)

## 2021-02-19 LAB — CMP (CANCER CENTER ONLY)
ALT: 14 U/L (ref 0–44)
AST: 14 U/L — ABNORMAL LOW (ref 15–41)
Albumin: 4.3 g/dL (ref 3.5–5.0)
Alkaline Phosphatase: 73 U/L (ref 38–126)
Anion gap: 12 (ref 5–15)
BUN: 21 mg/dL (ref 8–23)
CO2: 24 mmol/L (ref 22–32)
Calcium: 10.2 mg/dL (ref 8.9–10.3)
Chloride: 105 mmol/L (ref 98–111)
Creatinine: 0.79 mg/dL (ref 0.44–1.00)
GFR, Estimated: >60 mL/min (ref >60)
Glucose, Bld: 159 mg/dL — ABNORMAL HIGH (ref 70–99)
Potassium: 4 mmol/L (ref 3.5–5.1)
Sodium: 141 mmol/L (ref 135–145)
Total Bilirubin: 0.4 mg/dL (ref 0.3–1.2)
Total Protein: 7.5 g/dL (ref 6.5–8.1)

## 2021-02-19 MED ORDER — SODIUM CHLORIDE 0.9% FLUSH
10.0000 mL | Freq: Once | INTRAVENOUS | Status: AC
  Administered 2021-02-19: 10 mL

## 2021-02-19 MED ORDER — DIPHENHYDRAMINE HCL 50 MG/ML IJ SOLN
25.0000 mg | Freq: Once | INTRAMUSCULAR | Status: AC
  Administered 2021-02-19: 25 mg via INTRAVENOUS
  Filled 2021-02-19: qty 1

## 2021-02-19 MED ORDER — PALONOSETRON HCL INJECTION 0.25 MG/5ML
0.2500 mg | Freq: Once | INTRAVENOUS | Status: AC
  Administered 2021-02-19: 0.25 mg via INTRAVENOUS
  Filled 2021-02-19: qty 5

## 2021-02-19 MED ORDER — SODIUM CHLORIDE 0.9 % IV SOLN
468.6000 mg | Freq: Once | INTRAVENOUS | Status: AC
  Administered 2021-02-19: 470 mg via INTRAVENOUS
  Filled 2021-02-19: qty 47

## 2021-02-19 MED ORDER — HEPARIN SOD (PORK) LOCK FLUSH 100 UNIT/ML IV SOLN
500.0000 [IU] | Freq: Once | INTRAVENOUS | Status: AC | PRN
  Administered 2021-02-19: 500 [IU]

## 2021-02-19 MED ORDER — SODIUM CHLORIDE 0.9 % IV SOLN
175.0000 mg/m2 | Freq: Once | INTRAVENOUS | Status: AC
  Administered 2021-02-19: 288 mg via INTRAVENOUS
  Filled 2021-02-19: qty 48

## 2021-02-19 MED ORDER — SODIUM CHLORIDE 0.9 % IV SOLN: Freq: Once | INTRAVENOUS | Status: AC

## 2021-02-19 MED ORDER — TRAZODONE HCL 50 MG PO TABS
50.0000 mg | ORAL_TABLET | Freq: Every day | ORAL | 3 refills | Status: DC
  Filled 2021-02-19: qty 30, 30d supply, fill #0

## 2021-02-19 MED ORDER — FAMOTIDINE 20 MG IN NS 100 ML IVPB
20.0000 mg | Freq: Once | INTRAVENOUS | Status: AC
  Administered 2021-02-19: 20 mg via INTRAVENOUS
  Filled 2021-02-19: qty 100

## 2021-02-19 MED ORDER — SODIUM CHLORIDE 0.9 % IV SOLN
10.0000 mg | Freq: Once | INTRAVENOUS | Status: AC
  Administered 2021-02-19: 10 mg via INTRAVENOUS
  Filled 2021-02-19: qty 10

## 2021-02-19 MED ORDER — SODIUM CHLORIDE 0.9% FLUSH
10.0000 mL | INTRAVENOUS | Status: DC | PRN
  Administered 2021-02-19: 10 mL

## 2021-02-19 MED ORDER — SODIUM CHLORIDE 0.9 % IV SOLN
150.0000 mg | Freq: Once | INTRAVENOUS | Status: AC
  Administered 2021-02-19: 150 mg via INTRAVENOUS
  Filled 2021-02-19: qty 150

## 2021-02-19 NOTE — Progress Notes (Signed)
[facility] OFFICE PROGRESS NOTE  Patient Care Team: Algis Greenhouse, MD as PCP - General (Unknown Physician Specialty) Lafonda Mosses, MD as Consulting Physician (Gynecologic Oncology) Heath Lark, MD as Consulting Physician (Hematology and Oncology)  ASSESSMENT & PLAN:  Extraovarian primary peritoneal carcinoma [facility]) She had multiple complications with chemotherapy including constipation and recent blood clot We will proceed with treatment as scheduled I plan to order imaging study before her next cycle of treatment  Insomnia She has significant insomnia and Remeron did not help I have discontinue Remeron and resume her prior treatment with trazodone  Other constipation She has severe constipation with treatment We discussed aggressive laxative therapy  Internal jugular vein thrombosis, right ([place]) I suspect this is precipitated by port placement We will continue anticoagulation therapy until she has completed oral chemotherapy and her port is removed  Orders Placed This Encounter  Procedures   CT ABDOMEN PELVIS W CONTRAST    Standing Status:   Future    Standing Expiration Date:   02/19/2022    Order Specific Question:   If indicated for the ordered procedure, I authorize the administration of contrast media per Radiology protocol    Answer:   Yes    Order Specific Question:   Preferred imaging location?    Answer:   [facility]    Order Specific Question:   Radiology Contrast Protocol - do NOT remove file path    Answer:   [email or web address]    All questions were answered. The patient knows to call the clinic with any problems, questions or concerns. The total time spent in the appointment was 20 minutes encounter with patients including review of chart and various tests results, discussions about plan of care and coordination of care plan   Heath Lark, MD 02/19/2021 11:48 AM  INTERVAL HISTORY: Please  see below for problem oriented charting. she returns for treatment follow-up for cycle 3 of chemotherapy She developed IJ thrombosis and is on apixaban She is doing well except for mild bruising No peripheral neuropathy from recent treatment She developed severe constipation for 6 days after chemotherapy that subsequently resolved  REVIEW OF SYSTEMS:   Constitutional: Denies fevers, chills or abnormal weight loss Eyes: Denies blurriness of vision Ears, nose, mouth, throat, and face: Denies mucositis or sore throat Respiratory: Denies cough, dyspnea or wheezes Cardiovascular: Denies palpitation, chest discomfort or lower extremity swelling Skin: Denies abnormal skin rashes Lymphatics: Denies new lymphadenopathy  Neurological:Denies numbness, tingling or new weaknesses Behavioral/Psych: Mood is stable, no new changes  All other systems were reviewed with the patient and are negative.  I have reviewed the past medical history, past surgical history, social history and family history with the patient and they are unchanged from previous note.  ALLERGIES:  is allergic to ciprofloxacin-ciproflox hcl er, amoxicillin, bupropion, codeine, duloxetine, methylphenidate, other, and latex.  MEDICATIONS:  Current Outpatient Medications  Medication Sig Dispense Refill   traZODone (DESYREL) 50 MG tablet Take 1 tablet (50 mg total) by mouth at bedtime. 30 tablet 3   alum & mag hydroxide-simeth (MAALOX/MYLANTA) 200-200-20 MG/5ML suspension Take 30 mLs by mouth every 4 (four) hours as needed for up to 6 doses for indigestion or heartburn. 180 mL 0   APIXABAN (ELIQUIS) VTE STARTER PACK ($RemoveBefor'10MG'yeJcHxqmprax$  AND $Re'5MG'QAp$ ) Take as directed on package: start with two-$RemoveBefore'5mg'ZoVNnuAluXBuQ$  tablets twice daily for 7 days. On day 8, switch to one-$RemoveBefor'5mg'uFKePNVhvgAC$  tablet twice daily. 1 each 0   Apoaequorin (PREVAGEN PO) Take 1 tablet by  mouth in the morning.     Cranberry-Vitamin C (CRANBERRY CONCENTRATE/VITAMINC PO) Take 2 tablets by mouth in the morning.      dexamethasone (DECADRON) 4 MG tablet Take 2 tablets the night before and 2 tablets the morning of chemotherapy, every 3 weeks for 6 cycles 36 tablet 6   famotidine (PEPCID) 20 MG tablet Take 1 tablet (20 mg total) by mouth daily. Can take a second dose as needed daily 40 tablet 3   FLUoxetine (PROZAC) 20 MG tablet Take 10 mg by mouth every evening.     Omega-3 Fatty Acids (FISH OIL CONCENTRATE PO) Take 1 capsule by mouth 3 (three) times a week.     ondansetron (ZOFRAN) 8 MG tablet Take 1 tablet by mouth 2 times daily as needed for refractory nausea / vomiting. Start on day 3 after carboplatin chemo. 30 tablet 1   Probiotic Product (PROBIOTIC PO) Take 1 capsule by mouth in the morning. OLLY [place] [employer] Probiotic Capsules     prochlorperazine (COMPAZINE) 10 MG tablet Take 1 tablet (10 mg total) by mouth every 6 (six) hours as needed (Nausea or vomiting). 30 tablet 1   No current facility-administered medications for this visit.   Facility-Administered Medications Ordered in Other Visits  Medication Dose Route Frequency Provider Last Rate Last Admin   CARBOplatin (PARAPLATIN) 470 mg in sodium chloride 0.9 % 250 mL chemo infusion  470 mg Intravenous Once Alvy Bimler, Johniece Hornbaker, MD       dexamethasone (DECADRON) 10 mg in sodium chloride 0.9 % 50 mL IVPB  10 mg Intravenous Once Alvy Bimler, Amadea Keagy, MD       fosaprepitant (EMEND) 150 mg in sodium chloride 0.9 % 145 mL IVPB  150 mg Intravenous Once Alvy Bimler, Shyana Kulakowski, MD       heparin lock flush 100 unit/mL  500 Units Intracatheter Once PRN Alvy Bimler, Milayna Rotenberg, MD       PACLitaxel (TAXOL) 288 mg in sodium chloride 0.9 % 250 mL chemo infusion (> $RemoveBef'80mg'sllUMcpUOc$ /m2)  175 mg/m2 (Treatment Plan Recorded) Intravenous Once Alvy Bimler, Oluwatomisin Deman, MD       sodium chloride flush (NS) 0.9 % injection 10 mL  10 mL Intracatheter PRN Heath Lark, MD        SUMMARY OF ONCOLOGIC HISTORY: Oncology History Overview Note  Low grade serous Foundation One done on 8/9: HRD not detected, MSI stable, low tumor mutational  burden 1 Muts/Mb, no alterations of BRCA1 & 2   Extraovarian primary peritoneal carcinoma ([place])  09/29/2020 Imaging   MRI lumbar spine The dominant finding in this case is that of massive retroperitoneal lymphadenopathy, likely related to either metastatic disease or lymphoma.   Chronic degenerative and postoperative findings of the spine as outlined in detail above   10/05/2020 Imaging   IMPRESSION outside CT 1. Large calcified retroperitoneal lymph nodes highly concerning for metastatic disease, likely related to primary carcinoma of the colon or lymphoma. Clinical correlation is recommended. 2. No bowel obstruction. Normal appendix. 3. Stable right hepatic dome hemangioma.   10/30/2020 PET scan   1. The densely calcified retroperitoneal adenopathy is highly hypermetabolic with maximum SUV of 23.4 (Deauville 5) there is also Deauville 3 activity in a small right axillary lymph node. Given the lack of an obvious primary outside of the retroperitoneum, appearance tends to favor lymphoma or Castleman's disease. Tissue diagnosis suggested. 2. Other imaging findings of potential clinical significance: Chronic left maxillary sinusitis. Aortic Atherosclerosis (ICD10-I70.0). Coronary atherosclerosis.   11/10/2020 Procedure   CT guided biopsy of retroperitoneal adenopathy  11/10/2020 Pathology Results   biopsy results which favor a low-grade serous carcinoma of gyn origin. This is a somewhat atypical presentation, but my suspicion is that it is primary peritoneal carcinoma   11/16/2020 Tumor Marker   Patient's tumor was tested for the following markers: CA-125. Results of the tumor marker test revealed 96.4.   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 UTERUS, CERVIX, BILATERAL FALLOPIAN TUBES AND  OVARIES:  Uterus:  -  Inactive endometrium  -  Leiomyoma (1.1 cm; largest)  -  Calcified nodule of the broad ligament with psammoma bodies (right)  -  No hyperplasia or malignancy identified   Cervix:  -  Benign cervix  -  No dysplasia or malignancy identified   Ovaries, bilateral:  -  Serous cyst adenofibroma with microscopic focus of borderline change  (right)    Fallopian tubes, bilateral:  -  Benign fallopian tube with paratubal cyst (right)  -  No malignancy identified   B. OMENTUM, OMENTECTOMY:  -  No carcinoma identified   C. PERIAORTIC TUMOR, EXCISION:  -  Serous carcinoma, low-grade, with psammoma bodies  -  See comment below   COMMENT:   C.  At the request of the treating clinician, immunohistochemistry was repeated.  The neoplastic cells are positive for ER, cytokeratin 7, cytokeratin 5/6, p53, PAX8 and WT1 but negative for cytokeratin 20,  TTF-1, PR and thyroglobulin.  The morphology and immunophenotype are consistent with a primary peritoneal low-grade serous carcinoma (see also, [id number]).   ONCOLOGY TABLE:   OVARY or FALLOPIAN TUBE or PRIMARY PERITONEUM: Resection   Procedure: Total hysterectomy and bilateral salpingo-oophorectomy with omentectomy and periaortic tumor excision  Specimen Integrity: Intact  Tumor Site: Primary peritoneum (see comment)  Tumor Size: See comment  Histologic Type: Low-grade serous carcinoma  Histologic Grade: Low-grade  Ovarian Surface Involvement: Not identified  Fallopian Tube Surface Involvement: Not identified  Implants (required for advanced stage serous/seromucinous borderline  tumors only): Not applicable  Other Tissue/ Organ Involvement: Periaortic tumor  Largest Extrapelvic Peritoneal Focus: 5.5 cm (aggregate measurement)  Peritoneal/Ascitic Fluid Involvement: Not applicable  Chemotherapy Response Score (CRS): Not applicable, no known presurgical therapy  Regional Lymph Nodes: Not applicable (no lymph nodes  submitted or found)  Distant Metastasis:       Distant Site(s) Involved: Not applicable  Pathologic Stage Classification (pTNM, AJCC 8th Edition): pT3, pN not assigned  Ancillary Studies: Can be performed upon request  Representative Tumor Block: C1  Comment(s): The only focus of low-grade serous carcinoma identified in the resection specimen is the tissue submitted as periaortic tumor (part C).  Clinically, this was favored to represent matted lymph nodes; however, there is no evidence of nodal tissue in the examined material. This case was discussed with Dr. Berline Lopes and given the extent of the matted lymph nodes, this is being staged as a pT3.     12/01/2020 Surgery   Pre-operative Diagnosis: low grade serous carcinoma with significant retroperitoneal adenopathy, no other obvious imaging evidence of disease   Post-operative Diagnosis: same, significantly adherent presacral and para-aortic lymphadenopathy   Operation: Robotic-assisted laparoscopic total hysterectomy with bilateral salpingo-oophorectomy, laparotomy, infra-colic omentectomy, portion of necrotic tumor-ridden retroperitoneal lymph nodes, oversew of sigmoid serosa   Surgeon: Jeral Pinch MD   Assistant Surgeon: Lahoma Crocker MD (an MD assistant  was necessary for tissue manipulation, management of robotic instrumentation, retraction and positioning due to the complexity of the case and hospital policies).    Intra-operative consultant:  Everitt Amber MD    Operative Findings: On EUA, small mobile uterus. ON intra-abdominal entry, normal liver and diaphragm. One filmy adhesion noted between the right liver lobe and the anterior abdominal wall. The spleen was somewhat lateral and appeared mildly enlarged. There were some adhesions of the infragastric omentum to the anterior abdominal wall in the midline. The omentum was normal appearing without obvious evidence of tumor. Small and large bowel were normal appearing, appendix  normal. Uterus 6cm and normal in appearance. Somewhat atrophic bilateral adnexa. There were filmy adhesions between each adnexa and the pelvic sidewall. Some filmy adhesions were also noted between the posterior uterus and rectum. Approximately 0.5-1cm white nodule within the broad ligament, lateral to the right cornua, c/w possible fibroma vs fibroid. No pelvic adenopathy. Significantly enlarged, necrotic, and tumor-filled lymph node conglomeration was noted starting within the presacral space (adherent to the sacral promontory) and extending superiorly along the great vessels to the level of the renal vessel (suspected). Given adherence of the involved lymph nodes, I could not visualize this anatomy, I could long palpate along each lateral aspect of the aorta. There was no identifiable plane between matted lymph nodes and underlying aorta or IVC. Given burden of disease and my concern that resection was not feasible, I asked my senior partner (Dr. Denman George) to evaluate intra-op. She agreed that moving forward with attempt at debulking involved lymph nodes was likely not feasible and an attempt at this time would be unsafe.   There was no obvious peritoneal disease and no clear involvement of gyn organs. Despite prior biopsy, findings at surgery remain concerning for a non-gynecologic process. If this is confirmed to be low-grade serous carcinoma of gyn origin, this was an R2 resection.    12/15/2020 Initial Diagnosis   Extraovarian primary peritoneal carcinoma ([place])   12/15/2020 Cancer Staging   Staging form: [place], AJCC 7th Edition - Pathologic stage from 12/15/2020: Stage IIIC (T3c, N0, cM0) - Signed by Heath Lark, MD on 12/15/2020 Staged by: Managing physician Stage prefix: Initial diagnosis   01/04/2021 Procedure   Successful placement of a power injectable Port-A-Cath via the right internal jugular vein. The catheter is ready for immediate use.   01/08/2021 -  Chemotherapy    Patient is on Treatment  Plan: OVARIAN CARBOPLATIN (AUC 6) / PACLITAXEL (175) Q21D X 6 CYCLES       01/29/2021 Tumor Marker   Patient's tumor was tested for the following markers: CA-125. Results of the tumor marker test revealed 59.   02/05/2021 Imaging   Right:  No evidence of superficial vein thrombosis in the upper extremity.  Findings  consistent with acute deep vein thrombosis involving the right internal jugular vein.     Left:  No evidence of thrombosis in the subclavian.         PHYSICAL EXAMINATION: ECOG PERFORMANCE STATUS: 1 - Symptomatic but completely ambulatory  Vitals:   02/19/21 1046  BP: (!) 150/82  Pulse: 84  Resp: 18  Temp: (!) 97.5 F (36.4 C)  SpO2: 100%   Filed Weights   02/19/21 1046  Weight: 136 lb 9.6 oz (62 kg)    GENERAL:alert, no distress and comfortable SKIN: skin color, texture, turgor are normal, no rashes or significant lesions EYES: normal, Conjunctiva are pink and non-injected, sclera clear OROPHARYNX:no exudate, no erythema and lips, buccal  mucosa, and tongue normal  NECK: supple, thyroid normal size, non-tender, without nodularity LYMPH:  no palpable lymphadenopathy in the cervical, axillary or inguinal LUNGS: clear to auscultation and percussion with normal breathing effort HEART: regular rate & rhythm and no murmurs and no lower extremity edema ABDOMEN:abdomen soft, non-tender and normal bowel sounds Musculoskeletal:no cyanosis of digits and no clubbing  NEURO: alert & oriented x 3 with fluent speech, no focal motor/sensory deficits  LABORATORY DATA:  I have reviewed the data as listed    Component Value Date/Time   NA 141 02/19/2021 1011   K 4.0 02/19/2021 1011   CL 105 02/19/2021 1011   CO2 24 02/19/2021 1011   GLUCOSE 159 (H) 02/19/2021 1011   BUN 21 02/19/2021 1011   CREATININE 0.79 02/19/2021 1011   CALCIUM 10.2 02/19/2021 1011   PROT 7.5 02/19/2021 1011   ALBUMIN 4.3 02/19/2021 1011   AST 14 (L) 02/19/2021 1011   ALT 14 02/19/2021 1011    ALKPHOS 73 02/19/2021 1011   BILITOT 0.4 02/19/2021 1011   GFRNONAA >60 02/19/2021 1011    No results found for: SPEP, UPEP  Lab Results  Component Value Date   WBC 12.2 (H) 02/19/2021   NEUTROABS 10.9 (H) 02/19/2021   HGB 13.1 02/19/2021   HCT 36.8 02/19/2021   MCV 87.4 02/19/2021   PLT 262 02/19/2021      Chemistry      Component Value Date/Time   NA 141 02/19/2021 1011   K 4.0 02/19/2021 1011   CL 105 02/19/2021 1011   CO2 24 02/19/2021 1011   BUN 21 02/19/2021 1011   CREATININE 0.79 02/19/2021 1011      Component Value Date/Time   CALCIUM 10.2 02/19/2021 1011   ALKPHOS 73 02/19/2021 1011   AST 14 (L) 02/19/2021 1011   ALT 14 02/19/2021 1011   BILITOT 0.4 02/19/2021 1011       RADIOGRAPHIC STUDIES: I have personally reviewed the radiological images as listed and agreed with the findings in the report. UE VENOUS DUPLEX [facility] 7 am - 7 pm)  Result Date: 02/05/2021 UPPER VENOUS STUDY  Patient Name:  CANDRA WEGNER  Date of Exam:   02/05/2021 Medical Rec #: [id number]          Accession #:    [id number] Date of Birth: 1955-07-15          Patient Gender: F Patient Age:   65 years Exam Location:  [facility] Procedure:      VAS [place] UPPER EXTREMITY VENOUS DUPLEX Referring Phys: Loree Fee PLUNKETT --------------------------------------------------------------------------------  Indications: Pain, and Swelling Risk Factors: Cancer. Comparison Study: No prior studies. Performing Technologist: Oliver Hum RVT  Examination Guidelines: A complete evaluation includes B-mode imaging, spectral Doppler, color Doppler, and power Doppler as needed of all accessible portions of each vessel. Bilateral testing is considered an integral part of a complete examination. Limited examinations for reoccurring indications may be performed as noted.  Right Findings: +----------+------------+---------+-----------+----------+-------+ RIGHT      CompressiblePhasicitySpontaneousPropertiesSummary +----------+------------+---------+-----------+----------+-------+ IJV           None       No        No                Acute  +----------+------------+---------+-----------+----------+-------+ Subclavian    Full       Yes       Yes                      +----------+------------+---------+-----------+----------+-------+  Axillary      Full       Yes       Yes                      +----------+------------+---------+-----------+----------+-------+ Brachial      Full       Yes       Yes                      +----------+------------+---------+-----------+----------+-------+ Radial        Full                                          +----------+------------+---------+-----------+----------+-------+ Ulnar         Full                                          +----------+------------+---------+-----------+----------+-------+ Cephalic      Full                                          +----------+------------+---------+-----------+----------+-------+ Basilic       Full                                          +----------+------------+---------+-----------+----------+-------+  Left Findings: +----------+------------+---------+-----------+----------+-------+ LEFT      CompressiblePhasicitySpontaneousPropertiesSummary +----------+------------+---------+-----------+----------+-------+ Subclavian    Full       Yes       Yes                      +----------+------------+---------+-----------+----------+-------+  Summary:  Right: No evidence of superficial vein thrombosis in the upper extremity. Findings consistent with acute deep vein thrombosis involving the right internal jugular vein.  Left: No evidence of thrombosis in the subclavian.  *See table(s) above for measurements and observations.  Diagnosing physician: Jamelle Haring Electronically signed by Jamelle Haring on 02/05/2021 at 4:36:35 PM.    Final

## 2021-02-19 NOTE — Assessment & Plan Note (Signed)
I suspect this is precipitated by port placement We will continue anticoagulation therapy until she has completed oral chemotherapy and her port is removed

## 2021-02-19 NOTE — Patient Instructions (Signed)
[facility] MEDICAL ONCOLOGY  Discharge Instructions: Thank you for choosing [facility] to provide your oncology and hematology care.   If you have a lab appointment with the Cancer Center, please go directly to the Cancer Center and check in at the registration area.   Wear comfortable clothing and clothing appropriate for easy access to any Portacath or PICC line.   We strive to give you quality time with your provider. You may need to reschedule your appointment if you arrive late (15 or more minutes).  Arriving late affects you and other patients whose appointments are after yours.  Also, if you miss three or more appointments without notifying the office, you may be dismissed from the clinic at the provider's discretion.      For prescription refill requests, have your pharmacy contact our office and allow 72 hours for refills to be completed.    Today you received the following chemotherapy and/or immunotherapy agents Carboplatin and Taxol      To help prevent nausea and vomiting after your treatment, we encourage you to take your nausea medication as directed.  BELOW ARE SYMPTOMS THAT SHOULD BE REPORTED IMMEDIATELY: *FEVER GREATER THAN 100.4 F (38 C) OR HIGHER *CHILLS OR SWEATING *NAUSEA AND VOMITING THAT IS NOT CONTROLLED WITH YOUR NAUSEA MEDICATION *UNUSUAL SHORTNESS OF BREATH *UNUSUAL BRUISING OR BLEEDING *URINARY PROBLEMS (pain or burning when urinating, or frequent urination) *BOWEL PROBLEMS (unusual diarrhea, constipation, pain near the anus) TENDERNESS IN MOUTH AND THROAT WITH OR WITHOUT PRESENCE OF ULCERS (sore throat, sores in mouth, or a toothache) UNUSUAL RASH, SWELLING OR PAIN  UNUSUAL VAGINAL DISCHARGE OR ITCHING   Items with * indicate a potential emergency and should be followed up as soon as possible or go to the Emergency Department if any problems should occur.  Please show the CHEMOTHERAPY ALERT CARD or IMMUNOTHERAPY ALERT CARD at  check-in to the Emergency Department and triage nurse.  Should you have questions after your visit or need to cancel or reschedule your appointment, please contact [facility]COL[facility]  Dept: [phone]  and follow the prompts.  Office hours are 8:00 a.m. to 4:30 p.m. Monday - Friday. Please note that voicemails left after 4:00 p.m. may not be returned until the following business day.  We are closed weekends and major holidays. You have access to a nurse at all times for urgent questions. Please call the main number to the clinic Dept: [phone] and follow the prompts.   For any non-urgent questions, you may also contact your provider using MyChart. We now offer e-Visits for anyone 66 and older to request care online for non-urgent symptoms. For details visit [email or web address].   Also download the MyChart app! Go to the app store, search "MyChart", open the app, select , and log in with your MyChart username and password.  Due to Covid, a mask is required upon entering the hospital/clinic. If you do not have a mask, one will be given to you upon arrival. For doctor visits, patients may have 1 support person aged 66 or older with them. For treatment visits, patients cannot have anyone with them due to current Covid guidelines and our immunocompromised population.   

## 2021-02-19 NOTE — Assessment & Plan Note (Signed)
She has significant insomnia and Remeron did not help I have discontinue Remeron and resume her prior treatment with trazodone

## 2021-02-19 NOTE — Assessment & Plan Note (Signed)
She had multiple complications with chemotherapy including constipation and recent blood clot We will proceed with treatment as scheduled I plan to order imaging study before her next cycle of treatment

## 2021-02-19 NOTE — Assessment & Plan Note (Signed)
She has severe constipation with treatment We discussed aggressive laxative therapy

## 2021-03-05 ENCOUNTER — Telehealth: Payer: Self-pay

## 2021-03-05 NOTE — Telephone Encounter (Signed)
Appt moved for lab/flush. Reviewed all upcoming appts. She is aware.

## 2021-03-05 NOTE — Telephone Encounter (Signed)
-----   Message from Heath Lark, MD sent at 03/05/2021 10:45 AM EDT ----- Pls offer labs and flush before her CT scan next week and then move the aptt

## 2021-03-08 ENCOUNTER — Telehealth: Payer: Self-pay

## 2021-03-08 ENCOUNTER — Other Ambulatory Visit: Payer: Self-pay | Admitting: Hematology and Oncology

## 2021-03-08 ENCOUNTER — Other Ambulatory Visit (HOSPITAL_COMMUNITY): Payer: Self-pay

## 2021-03-08 ENCOUNTER — Other Ambulatory Visit: Payer: Self-pay

## 2021-03-08 MED ORDER — APIXABAN 5 MG PO TABS
5.0000 mg | ORAL_TABLET | Freq: Two times a day (BID) | ORAL | 3 refills | Status: DC
Start: 2021-03-08 — End: 2021-03-08

## 2021-03-08 MED ORDER — APIXABAN 5 MG PO TABS
5.0000 mg | ORAL_TABLET | Freq: Two times a day (BID) | ORAL | 3 refills | Status: DC
  Filled 2021-03-08 (×3): qty 60, 30d supply, fill #0

## 2021-03-08 NOTE — Telephone Encounter (Signed)
Called and told Rx sent to pharmacy. She verbalized understanding. 

## 2021-03-08 NOTE — Telephone Encounter (Signed)
Returned her call. She is reviewing upcoming appt times. Appts reviewed.  She is requesting a refill of Eliquis Rx and ask that Rx be sent to [facility].

## 2021-03-08 NOTE — Telephone Encounter (Signed)
Returned her call. Rx for Eliquis will cost too much at pharmacy. She ask that Rx be sent to [facility] outpatient pharmacy. Rx sent and she is aware.

## 2021-03-08 NOTE — Telephone Encounter (Signed)
done

## 2021-03-10 ENCOUNTER — Ambulatory Visit (HOSPITAL_COMMUNITY)
Admission: RE | Admit: 2021-03-10 | Discharge: 2021-03-10 | Disposition: A | Discharge status: Home / Self Care | Payer: Medicare Other | Source: Ambulatory Visit | Attending: Hematology and Oncology | Admitting: Hematology and Oncology

## 2021-03-10 ENCOUNTER — Inpatient Hospital Stay: Payer: Medicare Other | Attending: Hematology and Oncology

## 2021-03-10 ENCOUNTER — Encounter (HOSPITAL_COMMUNITY): Payer: Self-pay

## 2021-03-10 ENCOUNTER — Other Ambulatory Visit: Payer: Self-pay

## 2021-03-10 DIAGNOSIS — C481 Malignant neoplasm of specified parts of peritoneum: Secondary | ICD-10-CM

## 2021-03-10 DIAGNOSIS — Z7901 Long term (current) use of anticoagulants: Secondary | ICD-10-CM | POA: Insufficient documentation

## 2021-03-10 DIAGNOSIS — C482 Malignant neoplasm of peritoneum, unspecified: Secondary | ICD-10-CM | POA: Insufficient documentation

## 2021-03-10 DIAGNOSIS — Z79899 Other long term (current) drug therapy: Secondary | ICD-10-CM | POA: Insufficient documentation

## 2021-03-10 DIAGNOSIS — Z86718 Personal history of other venous thrombosis and embolism: Secondary | ICD-10-CM | POA: Insufficient documentation

## 2021-03-10 LAB — CMP (CANCER CENTER ONLY)
ALT: 14 U/L (ref 0–44)
AST: 16 U/L (ref 15–41)
Albumin: 4.1 g/dL (ref 3.5–5.0)
Alkaline Phosphatase: 59 U/L (ref 38–126)
Anion gap: 9 (ref 5–15)
BUN: 13 mg/dL (ref 8–23)
CO2: 27 mmol/L (ref 22–32)
Calcium: 9.7 mg/dL (ref 8.9–10.3)
Chloride: 106 mmol/L (ref 98–111)
Creatinine: 0.68 mg/dL (ref 0.44–1.00)
GFR, Estimated: >60 mL/min (ref >60)
Glucose, Bld: 107 mg/dL — ABNORMAL HIGH (ref 70–99)
Potassium: 4 mmol/L (ref 3.5–5.1)
Sodium: 142 mmol/L (ref 135–145)
Total Bilirubin: 0.6 mg/dL (ref 0.3–1.2)
Total Protein: 7 g/dL (ref 6.5–8.1)

## 2021-03-10 LAB — CBC WITH DIFFERENTIAL (CANCER CENTER ONLY)
Abs Immature Granulocytes: 0.02 10*3/uL (ref 0.00–0.07)
Basophils Absolute: 0 10*3/uL (ref 0.0–0.1)
Basophils Relative: 0 %
Eosinophils Absolute: 0.1 10*3/uL (ref 0.0–0.5)
Eosinophils Relative: 1 %
HCT: 34 % — ABNORMAL LOW (ref 36.0–46.0)
Hemoglobin: 11.5 g/dL — ABNORMAL LOW (ref 12.0–15.0)
Immature Granulocytes: 0 %
Lymphocytes Relative: 30 %
Lymphs Abs: 1.7 10*3/uL (ref 0.7–4.0)
MCH: 30.7 pg (ref 26.0–34.0)
MCHC: 33.8 g/dL (ref 30.0–36.0)
MCV: 90.7 fL (ref 80.0–100.0)
Monocytes Absolute: 0.6 10*3/uL (ref 0.1–1.0)
Monocytes Relative: 12 %
Neutro Abs: 3.1 10*3/uL (ref 1.7–7.7)
Neutrophils Relative %: 57 %
Platelet Count: 217 10*3/uL (ref 150–400)
RBC: 3.75 MIL/uL — ABNORMAL LOW (ref 3.87–5.11)
RDW: 14.5 % (ref 11.5–15.5)
WBC Count: 5.5 10*3/uL (ref 4.0–10.5)
nRBC: 0 % (ref 0.0–0.2)

## 2021-03-10 MED ORDER — SODIUM CHLORIDE 0.9% FLUSH
10.0000 mL | Freq: Once | INTRAVENOUS | Status: AC
  Administered 2021-03-10: 10 mL

## 2021-03-10 MED ORDER — HEPARIN SOD (PORK) LOCK FLUSH 100 UNIT/ML IV SOLN
INTRAVENOUS | Status: AC
  Administered 2021-03-10: 500 [IU]
  Filled 2021-03-10: qty 5

## 2021-03-10 MED ORDER — IOHEXOL 350 MG/ML SOLN
75.0000 mL | Freq: Once | INTRAVENOUS | Status: AC | PRN
  Administered 2021-03-10: 75 mL via INTRAVENOUS

## 2021-03-11 LAB — CA 125: Cancer Antigen (CA) 125: 81.8 U/mL — ABNORMAL HIGH (ref 0.0–38.1)

## 2021-03-11 MED FILL — Fosaprepitant Dimeglumine For IV Infusion 150 MG (Base Eq): INTRAVENOUS | Qty: 5 | Status: AC

## 2021-03-11 MED FILL — Dexamethasone Sodium Phosphate Inj 100 MG/10ML: INTRAMUSCULAR | Qty: 1 | Status: AC

## 2021-03-12 ENCOUNTER — Encounter: Payer: Self-pay | Admitting: Hematology and Oncology

## 2021-03-12 ENCOUNTER — Other Ambulatory Visit: Payer: Self-pay

## 2021-03-12 ENCOUNTER — Inpatient Hospital Stay: Payer: Medicare Other

## 2021-03-12 ENCOUNTER — Inpatient Hospital Stay: Payer: Medicare Other | Admitting: Hematology and Oncology

## 2021-03-12 VITALS — BP 143/80 | HR 69 | Temp 97.7°F | Resp 18 | Ht 63.0 in | Wt 136.0 lb

## 2021-03-12 DIAGNOSIS — C481 Malignant neoplasm of specified parts of peritoneum: Secondary | ICD-10-CM | POA: Diagnosis not present

## 2021-03-12 DIAGNOSIS — I82C11 Acute embolism and thrombosis of right internal jugular vein: Secondary | ICD-10-CM | POA: Diagnosis not present

## 2021-03-12 DIAGNOSIS — Z86718 Personal history of other venous thrombosis and embolism: Secondary | ICD-10-CM | POA: Insufficient documentation

## 2021-03-12 DIAGNOSIS — Z79899 Other long term (current) drug therapy: Secondary | ICD-10-CM | POA: Diagnosis not present

## 2021-03-12 DIAGNOSIS — C482 Malignant neoplasm of peritoneum, unspecified: Secondary | ICD-10-CM | POA: Insufficient documentation

## 2021-03-12 DIAGNOSIS — Z7189 Other specified counseling: Secondary | ICD-10-CM | POA: Diagnosis not present

## 2021-03-12 DIAGNOSIS — Z7901 Long term (current) use of anticoagulants: Secondary | ICD-10-CM | POA: Diagnosis not present

## 2021-03-12 NOTE — Assessment & Plan Note (Signed)
I have reviewed her blood work and recent imaging studies Unfortunately, the patient has no appreciable changes on CT imaging She has persistent elevated tumor marker She tolerated treatment very poorly with diffuse myalgias and arthralgias We discussed risk and benefits of discontinuation of chemotherapy and switching her to antiestrogen therapy I reviewed with her side effects to be expected with antiestrogen therapy We also discussed the role of surgery in a tertiary center involving multiple different surgeons given proximity to her vasculature Ultimately, she is in agreement to stop chemotherapy and to see her GYN surgeon for evaluation I will get her port removed

## 2021-03-12 NOTE — Assessment & Plan Note (Signed)
We have extensive discussions about goals of care with or without chemotherapy or surgery The patient is somewhat leaning towards not pursuing further treatment but is interested to talk to GYN surgeon

## 2021-03-12 NOTE — Progress Notes (Signed)
[name] OFFICE PROGRESS NOTE  Patient Care Team: Algis Greenhouse, MD as PCP - General (Unknown Physician Specialty) Lafonda Mosses, MD as Consulting Physician (Gynecologic Oncology) Heath Lark, MD as Consulting Physician (Hematology and Oncology)  ASSESSMENT & PLAN:  Extraovarian primary peritoneal carcinoma [facility]) I have reviewed her blood work and recent imaging studies Unfortunately, the patient has no appreciable changes on CT imaging She has persistent elevated tumor marker She tolerated treatment very poorly with diffuse myalgias and arthralgias We discussed risk and benefits of discontinuation of chemotherapy and switching her to antiestrogen therapy I reviewed with her side effects to be expected with antiestrogen therapy We also discussed the role of surgery in a tertiary center involving multiple different surgeons given proximity to her vasculature Ultimately, she is in agreement to stop chemotherapy and to see her GYN surgeon for evaluation I will get her port removed  Internal jugular vein thrombosis, right ([place]) This was precipitated by recent port I will get that removed She can stop Eliquis after port removal She is instructed to stop Eliquis 2 days before port removal  Goals of care, counseling/discussion We have extensive discussions about goals of care with or without chemotherapy or surgery The patient is somewhat leaning towards not pursuing further treatment but is interested to talk to GYN surgeon  Orders Placed This Encounter  Procedures   IR Removal Tun Access W/ Port W/O FL    Standing Status:   Future    Standing Expiration Date:   03/12/2022    Order Specific Question:   Reason for exam:    Answer:   chemo completed    Order Specific Question:   Preferred Imaging Location?    Answer:   [facility]    All questions were answered. The patient knows to call the clinic with any problems, questions or concerns. The total  time spent in the appointment was 40 minutes encounter with patients including review of chart and various tests results, discussions about plan of care and coordination of care plan   Heath Lark, MD 03/12/2021 2:02 PM  INTERVAL HISTORY: Please see below for problem oriented charting. she returns for treatment follow-up and review of test results She is here accompanied by her husband She tolerated last cycle of treatment very poorly with diffuse myalgias and arthralgias She still complaining of pain especially in her knees but due to intolerance to multiple different pain medications, she is suffering in pain She is disappointed to see that CT imaging show minimal changes  REVIEW OF SYSTEMS:   Constitutional: Denies fevers, chills or abnormal weight loss Eyes: Denies blurriness of vision Ears, nose, mouth, throat, and face: Denies mucositis or sore throat Respiratory: Denies cough, dyspnea or wheezes Cardiovascular: Denies palpitation, chest discomfort or lower extremity swelling Gastrointestinal:  Denies nausea, heartburn or change in bowel habits Skin: Denies abnormal skin rashes Lymphatics: Denies new lymphadenopathy or easy bruising Neurological:Denies numbness, tingling or new weaknesses Behavioral/Psych: Mood is stable, no new changes  All other systems were reviewed with the patient and are negative.  I have reviewed the past medical history, past surgical history, social history and family history with the patient and they are unchanged from previous note.  ALLERGIES:  is allergic to ciprofloxacin-ciproflox hcl er, amoxicillin, bupropion, codeine, duloxetine, methylphenidate, other, and latex.  MEDICATIONS:  Current Outpatient Medications  Medication Sig Dispense Refill   alum & mag hydroxide-simeth (MAALOX/MYLANTA) 200-200-20 MG/5ML suspension Take 30 mLs by mouth every 4 (four) hours  as needed for up to 6 doses for indigestion or heartburn. 180 mL 0   Apoaequorin  (PREVAGEN PO) Take 1 tablet by mouth in the morning.     Cranberry-Vitamin C (CRANBERRY CONCENTRATE/VITAMINC PO) Take 2 tablets by mouth in the morning.     famotidine (PEPCID) 20 MG tablet Take 1 tablet (20 mg total) by mouth daily. Can take a second dose as needed daily 40 tablet 3   FLUoxetine (PROZAC) 20 MG tablet Take 10 mg by mouth every evening.     Omega-3 Fatty Acids (FISH OIL CONCENTRATE PO) Take 1 capsule by mouth 3 (three) times a week.     Probiotic Product (PROBIOTIC PO) Take 1 capsule by mouth in the morning. OLLY [employer] Probiotic Capsules     traZODone (DESYREL) 50 MG tablet Take 1 tablet (50 mg total) by mouth at bedtime. 30 tablet 3   No current facility-administered medications for this visit.    SUMMARY OF ONCOLOGIC HISTORY: Oncology History Overview Note  Low grade serous, ER positive [facility] One done on 8/9: HRD not detected, MSI stable, low tumor mutational burden 1 Muts/Mb, no alterations of BRCA1 & 2   Extraovarian primary peritoneal carcinoma ([place])  09/29/2020 Imaging   MRI lumbar spine The dominant finding in this case is that of massive retroperitoneal lymphadenopathy, likely related to either metastatic disease or lymphoma.   Chronic degenerative and postoperative findings of the spine as outlined in detail above   10/05/2020 Imaging   IMPRESSION outside CT 1. Large calcified retroperitoneal lymph nodes highly concerning for metastatic disease, likely related to primary carcinoma of the colon or lymphoma. Clinical correlation is recommended. 2. No bowel obstruction. Normal appendix. 3. Stable right hepatic dome hemangioma.   10/30/2020 PET scan   1. The densely calcified retroperitoneal adenopathy is highly hypermetabolic with maximum SUV of 23.4 (Deauville 5) there is also Deauville 3 activity in a small right axillary lymph node. Given the lack of an obvious primary outside of the retroperitoneum, appearance tends to favor lymphoma or Castleman's  disease. Tissue diagnosis suggested. 2. Other imaging findings of potential clinical significance: Chronic left maxillary sinusitis. Aortic Atherosclerosis (ICD10-I70.0). Coronary atherosclerosis.   11/10/2020 Procedure   CT guided biopsy of retroperitoneal adenopathy   11/10/2020 Pathology Results   biopsy results which favor a low-grade serous carcinoma of gyn origin. This is a somewhat atypical presentation, but my suspicion is that it is primary peritoneal carcinoma   11/16/2020 Tumor Marker   Patient's tumor was tested for the following markers: CA-125. Results of the tumor marker test revealed 96.4.   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 UTERUS, CERVIX, BILATERAL FALLOPIAN TUBES AND OVARIES:  Uterus:  -  Inactive endometrium  -  Leiomyoma (1.1 cm; largest)  -  Calcified nodule of the broad ligament with psammoma bodies (right)  -  No hyperplasia or malignancy identified   Cervix:  -  Benign cervix  -  No dysplasia or malignancy identified   Ovaries, bilateral:  -  Serous cyst adenofibroma with microscopic focus of borderline change  (right)    Fallopian tubes, bilateral:  -  Benign fallopian tube with paratubal cyst (right)  -  No malignancy identified   B. OMENTUM, OMENTECTOMY:  -  No carcinoma identified   C. PERIAORTIC TUMOR,  EXCISION:  -  Serous carcinoma, low-grade, with psammoma bodies  -  See comment below   COMMENT:   C.  At the request of the treating clinician, immunohistochemistry was repeated.  The neoplastic cells are positive for ER, cytokeratin 7, cytokeratin 5/6, p53, PAX8 and WT1 but negative for cytokeratin 20,  TTF-1, PR and thyroglobulin.  The morphology and immunophenotype are consistent with a primary peritoneal low-grade serous carcinoma  (see also, [id number]).   ONCOLOGY TABLE:   OVARY or FALLOPIAN TUBE or PRIMARY PERITONEUM: Resection   Procedure: Total hysterectomy and bilateral salpingo-oophorectomy with omentectomy and periaortic tumor excision  Specimen Integrity: Intact  Tumor Site: Primary peritoneum (see comment)  Tumor Size: See comment  Histologic Type: Low-grade serous carcinoma  Histologic Grade: Low-grade  Ovarian Surface Involvement: Not identified  Fallopian Tube Surface Involvement: Not identified  Implants (required for advanced stage serous/seromucinous borderline  tumors only): Not applicable  Other Tissue/ Organ Involvement: Periaortic tumor  Largest Extrapelvic Peritoneal Focus: 5.5 cm (aggregate measurement)  Peritoneal/Ascitic Fluid Involvement: Not applicable  Chemotherapy Response Score (CRS): Not applicable, no known presurgical therapy  Regional Lymph Nodes: Not applicable (no lymph nodes submitted or found)  Distant Metastasis:       Distant Site(s) Involved: Not applicable  Pathologic Stage Classification (pTNM, AJCC 8th Edition): pT3, pN not assigned  Ancillary Studies: Can be performed upon request  Representative Tumor Block: C1  Comment(s): The only focus of low-grade serous carcinoma identified in the resection specimen is the tissue submitted as periaortic tumor (part C).  Clinically, this was favored to represent matted lymph nodes; however, there is no evidence of nodal tissue in the examined material. This case was discussed with Dr. Berline Lopes and given the extent of the matted lymph nodes, this is being staged as a pT3.     12/01/2020 Surgery   Pre-operative Diagnosis: low grade serous carcinoma with significant retroperitoneal adenopathy, no other obvious imaging evidence of disease   Post-operative Diagnosis: same, significantly adherent presacral and para-aortic lymphadenopathy   Operation: Robotic-assisted laparoscopic total hysterectomy with bilateral salpingo-oophorectomy,  laparotomy, infra-colic omentectomy, portion of necrotic tumor-ridden retroperitoneal lymph nodes, oversew of sigmoid serosa   Surgeon: Jeral Pinch MD   Assistant Surgeon: Lahoma Crocker MD (an MD assistant was necessary for tissue manipulation, management of robotic instrumentation, retraction and positioning due to the complexity of the case and hospital policies).    Intra-operative consultant:  Everitt Amber MD    Operative Findings: On EUA, small mobile uterus. ON intra-abdominal entry, normal liver and diaphragm. One filmy adhesion noted between the right liver lobe and the anterior abdominal wall. The spleen was somewhat lateral and appeared mildly enlarged. There were some adhesions of the infragastric omentum to the anterior abdominal wall in the midline. The omentum was normal appearing without obvious evidence of tumor. Small and large bowel were normal appearing, appendix normal. Uterus 6cm and normal in appearance. Somewhat atrophic bilateral adnexa. There were filmy adhesions between each adnexa and the pelvic sidewall. Some filmy adhesions were also noted between the posterior uterus and rectum. Approximately 0.5-1cm white nodule within the broad ligament, lateral to the right cornua, c/w possible fibroma vs fibroid. No pelvic adenopathy. Significantly enlarged, necrotic, and tumor-filled lymph node conglomeration was noted starting within the presacral space (adherent to the sacral promontory) and extending superiorly along the great vessels to the level of the renal vessel (suspected). Given adherence of the involved lymph nodes, I could not visualize this anatomy, I could long palpate along  each lateral aspect of the aorta. There was no identifiable plane between matted lymph nodes and underlying aorta or IVC. Given burden of disease and my concern that resection was not feasible, I asked my senior partner (Dr. Andrey Farmer) to evaluate intra-op. She agreed that moving forward with attempt  at debulking involved lymph nodes was likely not feasible and an attempt at this time would be unsafe.   There was no obvious peritoneal disease and no clear involvement of gyn organs. Despite prior biopsy, findings at surgery remain concerning for a non-gynecologic process. If this is confirmed to be low-grade serous carcinoma of gyn origin, this was an R2 resection.    12/15/2020 Initial Diagnosis   Extraovarian primary peritoneal carcinoma (HCC)   12/15/2020 Cancer Staging   Staging form: Ovary, AJCC 7th Edition - Pathologic stage from 12/15/2020: Stage IIIC (T3c, N0, cM0) - Signed by Artis Delay, MD on 12/15/2020 Staged by: Managing physician Stage prefix: Initial diagnosis   01/04/2021 Procedure   Successful placement of a power injectable Port-A-Cath via the right internal jugular vein. The catheter is ready for immediate use.   01/08/2021 - 02/19/2021 Chemotherapy    Patient is on Treatment Plan: OVARIAN CARBOPLATIN (AUC 6) / PACLITAXEL (175) Q21D X 6 CYCLES       01/29/2021 Tumor Marker   Patient's tumor was tested for the following markers: CA-125. Results of the tumor marker test revealed 59.   02/05/2021 Imaging   Right:  No evidence of superficial vein thrombosis in the upper extremity.  Findings  consistent with acute deep vein thrombosis involving the right internal jugular vein.     Left:  No evidence of thrombosis in the subclavian.       03/10/2021 Tumor Marker   Patient's tumor was tested for the following markers: CA-125. Results of the tumor marker test revealed 81.8.   03/11/2021 Imaging   Stable bulky calcified retroperitoneal lymphadenopathy, consistent with metastatic disease.   Stable right hepatic lobe lesion, likely representing a benign hemangioma. This shoeds no FDG uptake on previous PET-CT scan.   No evidence of new or progressive disease within the abdomen or pelvis.   Aortic Atherosclerosis (ICD10-I70.0).     PHYSICAL EXAMINATION: ECOG  PERFORMANCE STATUS: 1 - Symptomatic but completely ambulatory  Vitals:   03/12/21 1056  BP: (!) 143/80  Pulse: 69  Resp: 18  Temp: 97.7 F (36.5 C)  SpO2: 100%   Filed Weights   03/12/21 1056  Weight: 136 lb (61.7 kg)    GENERAL:alert, no distress and comfortable NEURO: alert & oriented x 3 with fluent speech, no focal motor/sensory deficits  LABORATORY DATA:  I have reviewed the data as listed    Component Value Date/Time   NA 142 03/10/2021 0938   K 4.0 03/10/2021 0938   CL 106 03/10/2021 0938   CO2 27 03/10/2021 0938   GLUCOSE 107 (H) 03/10/2021 0938   BUN 13 03/10/2021 0938   CREATININE 0.68 03/10/2021 0938   CALCIUM 9.7 03/10/2021 0938   PROT 7.0 03/10/2021 0938   ALBUMIN 4.1 03/10/2021 0938   AST 16 03/10/2021 0938   ALT 14 03/10/2021 0938   ALKPHOS 59 03/10/2021 0938   BILITOT 0.6 03/10/2021 0938   GFRNONAA >60 03/10/2021 0938    No results found for: SPEP, UPEP  Lab Results  Component Value Date   WBC 5.5 03/10/2021   NEUTROABS 3.1 03/10/2021   HGB 11.5 (L) 03/10/2021   HCT 34.0 (L) 03/10/2021   MCV 90.7 03/10/2021  PLT 217 03/10/2021      Chemistry      Component Value Date/Time   NA 142 03/10/2021 0938   K 4.0 03/10/2021 0938   CL 106 03/10/2021 0938   CO2 27 03/10/2021 0938   BUN 13 03/10/2021 0938   CREATININE 0.68 03/10/2021 0938      Component Value Date/Time   CALCIUM 9.7 03/10/2021 0938   ALKPHOS 59 03/10/2021 0938   AST 16 03/10/2021 0938   ALT 14 03/10/2021 0938   BILITOT 0.6 03/10/2021 0938       RADIOGRAPHIC STUDIES: I have reviewed multiple imaging studies with the patient and her husband I have personally reviewed the radiological images as listed and agreed with the findings in the report. CT ABDOMEN PELVIS W CONTRAST  Result Date: 03/11/2021 CLINICAL DATA:  Follow-up ovarian carcinoma.  Ongoing chemotherapy. EXAM: CT ABDOMEN AND PELVIS WITH CONTRAST TECHNIQUE: Multidetector CT imaging of the abdomen and pelvis was  performed using the standard protocol following bolus administration of intravenous contrast. CONTRAST:  54mL OMNIPAQUE IOHEXOL 350 MG/ML SOLN COMPARISON:  10/02/2020 FINDINGS: Lower Chest: No acute findings. Hepatobiliary: Subcapsular mass in the posterior dome of the right hepatic lobe measures 2.5 x 2.3 cm on image 9/2, without significant change since prior study. This also shows no FDG uptake on recent PET-CT, and likely represents a benign hemangioma. No other liver lesions identified. Gallbladder is unremarkable. No evidence of biliary ductal dilatation. Pancreas:  No mass or inflammatory changes. Spleen: Within normal limits in size and appearance. Adrenals/Urinary Tract: No masses identified. No evidence of ureteral calculi or hydronephrosis. Stomach/Bowel: No evidence of obstruction, inflammatory process or abnormal fluid collections. Normal appendix visualized. Diverticulosis is seen involving the sigmoid colon, however there is no evidence of diverticulitis. Vascular/Lymphatic: Heavily calcified retroperitoneal lymphadenopathy is again seen surrounding the abdominal aorta and common iliac vessels. This measures 5.8 x 4.0 cm on image 35/2, also without significant change compared to prior exam. No new or increased sites of lymphadenopathy identified. Reproductive: Prior hysterectomy noted. Adnexal regions are unremarkable in appearance. Other:  None. Musculoskeletal:  No suspicious bone lesions identified. IMPRESSION: Stable bulky calcified retroperitoneal lymphadenopathy, consistent with metastatic disease. Stable right hepatic lobe lesion, likely representing a benign hemangioma. This shoeds no FDG uptake on previous PET-CT scan. No evidence of new or progressive disease within the abdomen or pelvis. Aortic Atherosclerosis (ICD10-I70.0). Electronically Signed   By: Marlaine Hind M.D.   On: 03/11/2021 15:47

## 2021-03-12 NOTE — Assessment & Plan Note (Signed)
This was precipitated by recent port I will get that removed She can stop Eliquis after port removal She is instructed to stop Eliquis 2 days before port removal

## 2021-03-15 ENCOUNTER — Other Ambulatory Visit: Payer: Self-pay

## 2021-03-15 ENCOUNTER — Encounter: Payer: Self-pay | Admitting: Gynecologic Oncology

## 2021-03-15 ENCOUNTER — Inpatient Hospital Stay (HOSPITAL_BASED_OUTPATIENT_CLINIC_OR_DEPARTMENT_OTHER): Payer: Medicare Other | Admitting: Gynecologic Oncology

## 2021-03-15 VITALS — BP 137/73 | HR 82 | Temp 98.4°F | Resp 16 | Ht 63.0 in | Wt 136.4 lb

## 2021-03-15 DIAGNOSIS — C482 Malignant neoplasm of peritoneum, unspecified: Secondary | ICD-10-CM

## 2021-03-15 DIAGNOSIS — Z7189 Other specified counseling: Secondary | ICD-10-CM | POA: Diagnosis not present

## 2021-03-15 DIAGNOSIS — C481 Malignant neoplasm of specified parts of peritoneum: Secondary | ICD-10-CM

## 2021-03-15 NOTE — Patient Instructions (Signed)
I am going to work on getting all of your information over to [facility] so that we can present you at our tumor conference either this Wednesday or next Wednesday.  In the meantime, I have ordered a bone density scan that we will work on getting.  If you can get the copy of your bone density scan from 2019, that would be great.  I will call you once we have the bone density scan and once I have heard what my partners think at [facility] in terms of the feasibility of a big surgery there.  We will make a decision about referring you at that point to discuss surgery versus starting hormonal therapy if you decide that that is something you would like to do.

## 2021-03-15 NOTE — Progress Notes (Signed)
Gynecologic Oncology Return Clinic Visit  03/15/21  Reason for Visit: treatment discussion  Treatment History: Oncology History Overview Note  Low grade serous, ER positive [facility] done on 8/9: HRD not detected, MSI stable, low tumor mutational burden 1 Muts/Mb, no alterations of BRCA1 & 2   Extraovarian primary peritoneal carcinoma ([place])  09/29/2020 Imaging   MRI lumbar spine The dominant finding in this case is that of massive retroperitoneal lymphadenopathy, likely related to either metastatic disease or lymphoma.   Chronic degenerative and postoperative findings of the spine as outlined in detail above   10/05/2020 Imaging   IMPRESSION outside CT 1. Large calcified retroperitoneal lymph nodes highly concerning for metastatic disease, likely related to primary carcinoma of the colon or lymphoma. Clinical correlation is recommended. 2. No bowel obstruction. Normal appendix. 3. Stable right hepatic dome hemangioma.   10/30/2020 PET scan   1. The densely calcified retroperitoneal adenopathy is highly hypermetabolic with maximum SUV of 23.4 (Deauville 5) there is also Deauville 3 activity in a small right axillary lymph node. Given the lack of an obvious primary outside of the retroperitoneum, appearance tends to favor lymphoma or Castleman's disease. Tissue diagnosis suggested. 2. Other imaging findings of potential clinical significance: Chronic left maxillary sinusitis. Aortic Atherosclerosis (ICD10-I70.0). Coronary atherosclerosis.   11/10/2020 Procedure   CT guided biopsy of retroperitoneal adenopathy   11/10/2020 Pathology Results   biopsy results which favor a low-grade serous carcinoma of gyn origin. This is a somewhat atypical presentation, but my suspicion is that it is primary peritoneal carcinoma   11/16/2020 Tumor Marker   Patient's tumor was tested for the following markers: CA-125. Results of the tumor marker test revealed 96.4.   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 UTERUS, CERVIX, BILATERAL FALLOPIAN TUBES AND OVARIES:  Uterus:  -  Inactive endometrium  -  Leiomyoma (1.1 cm; largest)  -  Calcified nodule of the broad ligament with psammoma bodies (right)  -  No hyperplasia or malignancy identified   Cervix:  -  Benign cervix  -  No dysplasia or malignancy identified   Ovaries, bilateral:  -  Serous cyst adenofibroma with microscopic focus of borderline change  (right)    Fallopian tubes, bilateral:  -  Benign fallopian tube with paratubal cyst (right)  -  No malignancy identified   B. OMENTUM, OMENTECTOMY:  -  No carcinoma identified   C. PERIAORTIC TUMOR, EXCISION:  -  Serous carcinoma, low-grade, with psammoma bodies  -  See comment below   COMMENT:   C.  At the request of the treating clinician, immunohistochemistry was repeated.  The neoplastic cells are positive for ER, cytokeratin 7, cytokeratin 5/6, p53, PAX8 and WT1 but negative for cytokeratin 20,  TTF-1, PR and thyroglobulin.  The morphology and immunophenotype are consistent with a primary peritoneal low-grade serous carcinoma (see also, [id number]).   ONCOLOGY TABLE:   OVARY or FALLOPIAN TUBE or PRIMARY PERITONEUM: Resection   Procedure: Total hysterectomy and bilateral salpingo-oophorectomy with omentectomy and periaortic tumor excision  Specimen Integrity: Intact  Tumor Site: Primary peritoneum (see comment)  Tumor Size: See comment  Histologic Type: Low-grade serous carcinoma  Histologic Grade: Low-grade  Ovarian Surface Involvement: Not identified  Fallopian Tube Surface Involvement: Not identified  Implants (required for advanced stage serous/seromucinous borderline  tumors  only): Not applicable  Other Tissue/ Organ Involvement: Periaortic tumor   Largest Extrapelvic Peritoneal Focus: 5.5 cm (aggregate measurement)  Peritoneal/Ascitic Fluid Involvement: Not applicable  Chemotherapy Response Score (CRS): Not applicable, no known presurgical therapy  Regional Lymph Nodes: Not applicable (no lymph nodes submitted or found)  Distant Metastasis:       Distant Site(s) Involved: Not applicable  Pathologic Stage Classification (pTNM, AJCC 8th Edition): pT3, pN not assigned  Ancillary Studies: Can be performed upon request  Representative Tumor Block: C1  Comment(s): The only focus of low-grade serous carcinoma identified in the resection specimen is the tissue submitted as periaortic tumor (part C).  Clinically, this was favored to represent matted lymph nodes; however, there is no evidence of nodal tissue in the examined material. This case was discussed with Dr. Berline Lopes and given the extent of the matted lymph nodes, this is being staged as a pT3.     12/01/2020 Surgery   Pre-operative Diagnosis: low grade serous carcinoma with significant retroperitoneal adenopathy, no other obvious imaging evidence of disease   Post-operative Diagnosis: same, significantly adherent presacral and para-aortic lymphadenopathy   Operation: Robotic-assisted laparoscopic total hysterectomy with bilateral salpingo-oophorectomy, laparotomy, infra-colic omentectomy, portion of necrotic tumor-ridden retroperitoneal lymph nodes, oversew of sigmoid serosa   Surgeon: Jeral Pinch MD   Assistant Surgeon: Lahoma Crocker MD (an MD assistant was necessary for tissue manipulation, management of robotic instrumentation, retraction and positioning due to the complexity of the case and hospital policies).    Intra-operative consultant:  Everitt Amber MD    Operative Findings: On EUA, small mobile uterus. ON intra-abdominal entry, normal liver and diaphragm. One filmy adhesion noted between the right liver lobe and the anterior abdominal wall. The spleen was somewhat  lateral and appeared mildly enlarged. There were some adhesions of the infragastric omentum to the anterior abdominal wall in the midline. The omentum was normal appearing without obvious evidence of tumor. Small and large bowel were normal appearing, appendix normal. Uterus 6cm and normal in appearance. Somewhat atrophic bilateral adnexa. There were filmy adhesions between each adnexa and the pelvic sidewall. Some filmy adhesions were also noted between the posterior uterus and rectum. Approximately 0.5-1cm white nodule within the broad ligament, lateral to the right cornua, c/w possible fibroma vs fibroid. No pelvic adenopathy. Significantly enlarged, necrotic, and tumor-filled lymph node conglomeration was noted starting within the presacral space (adherent to the sacral promontory) and extending superiorly along the great vessels to the level of the renal vessel (suspected). Given adherence of the involved lymph nodes, I could not visualize this anatomy, I could long palpate along each lateral aspect of the aorta. There was no identifiable plane between matted lymph nodes and underlying aorta or IVC. Given burden of disease and my concern that resection was not feasible, I asked my senior partner (Dr. Denman George) to evaluate intra-op. She agreed that moving forward with attempt at debulking involved lymph nodes was likely not feasible and an attempt at this time would be unsafe.   There was no obvious peritoneal disease and no clear involvement of gyn organs. Despite prior biopsy, findings at surgery remain concerning for a non-gynecologic process. If this is confirmed to be low-grade serous carcinoma of gyn origin, this was an R2 resection.    12/15/2020 Initial Diagnosis   Extraovarian primary peritoneal carcinoma ([place])   12/15/2020 Cancer Staging   Staging form: [place], AJCC 7th Edition - Pathologic stage from 12/15/2020: Stage IIIC (T3c, N0, cM0) - Signed by Heath Lark, MD  on 12/15/2020 Staged by: Managing  physician Stage prefix: Initial diagnosis   01/04/2021 Procedure   Successful placement of a power injectable Port-A-Cath via the right internal jugular vein. The catheter is ready for immediate use.   01/08/2021 - 02/19/2021 Chemotherapy    Patient is on Treatment Plan: OVARIAN CARBOPLATIN (AUC 6) / PACLITAXEL (175) Q21D X 6 CYCLES       01/29/2021 Tumor Marker   Patient's tumor was tested for the following markers: CA-125. Results of the tumor marker test revealed 59.   02/05/2021 Imaging   Right:  No evidence of superficial vein thrombosis in the upper extremity.  Findings  consistent with acute deep vein thrombosis involving the right internal jugular vein.     Left:  No evidence of thrombosis in the subclavian.       03/10/2021 Tumor Marker   Patient's tumor was tested for the following markers: CA-125. Results of the tumor marker test revealed 81.8.   03/11/2021 Imaging   Stable bulky calcified retroperitoneal lymphadenopathy, consistent with metastatic disease.   Stable right hepatic lobe lesion, likely representing a benign hemangioma. This shoeds no FDG uptake on previous PET-CT scan.   No evidence of new or progressive disease within the abdomen or pelvis.   Aortic Atherosclerosis (ICD10-I70.0).    Component Ref Range & Units 5 d ago 1 mo ago 3 mo ago  Cancer Antigen (CA) 125 0.0 - 38.1 U/mL 81.8 High   59.0 High  CM  96.4 High     Interval History: The patient presents today for discussion regarding the next treatment steps.  She has recently completed 3 cycles of adjuvant carboplatin and paclitaxel and unfortunately had a very hard time tolerating this.  She had significant bone and joint pain.  She is also struggled with constipation for almost the whole week after receiving chemotherapy.  Since her last cycle of chemotherapy, her bone and joint pain has improved but is still present.  She now is having bowel movements that she describes as formed and not diarrhea  but will have multiple a day when she does have bowel function and describes it as feeling as though she is "emptying her colon".  She has been able to gain some weight and is up to 136 pounds.  Unfortunately, the patient was found to have an internal jugular DVT in September, thought to be caused by her port.  She has been on Eliquis since.  Past Medical/Surgical History: Past Medical History:  Diagnosis Date   Arthritis    Chronic pain    Contact lens/glasses fitting    wears contacts or glasses   Depression    Fibromyalgia    Melanoma (HCC)    Peritoneal carcinoma (HCC)    PONV (postoperative nausea and vomiting)    prolonged sedation very hard to wake    Past Surgical History:  Procedure Laterality Date   BACK SURGERY     lumb lam   BREAST BIOPSY Right    2021   BREAST BIOPSY Left    BREAST ENHANCEMENT SURGERY  05/30/1980   implants    BREAST IMPLANT EXCHANGE  05/30/2009   CARPAL TUNNEL RELEASE  05/30/2001   rt and lt   CARPOMETACARPEL SUSPENSION PLASTY  06/22/2012   Procedure: CARPOMETACARPEL (CMC) SUSPENSION PLASTY;  Surgeon: Harvie Junior, MD;  Location:  SURGERY CENTER;  Service: Orthopedics;  Laterality: Left;  burton's interposition arthroplasty left thumb    CATARACT EXTRACTION W/ INTRAOCULAR LENS  IMPLANT, BILATERAL  COLONOSCOPY     x2   DIAGNOSTIC LAPAROSCOPY  05/30/2000   expl lap   DILATION AND CURETTAGE OF UTERUS  05/30/2001   ablation   ELBOW SURGERY  05/31/1999   decompression rt   EPIDURAL BLOCK INJECTION     multiple   IR IMAGING GUIDED PORT INSERTION  01/04/2021   LYMPH NODE DISSECTION N/A 12/01/2020   Procedure: LYMPH NODE DISSECTION;  Surgeon: Lafonda Mosses, MD;  Location: [facility];  Service: Gynecology;  Laterality: N/A;   SKIN CANCER EXCISION     melanoma, from abdomen   TONSILLECTOMY      Family History  Problem Relation Age of Onset   Lung cancer Father    Colon cancer Neg Hx    Breast cancer Neg Hx    Ovarian cancer Neg  Hx    Endometrial cancer Neg Hx    Pancreatic cancer Neg Hx    Prostate cancer Neg Hx     Social History   Socioeconomic History   Marital status: Married    Spouse name: Not on file   Number of children: Not on file   Years of education: Not on file   Highest education level: Not on file  Occupational History   Not on file  Tobacco Use   Smoking status: Never   Smokeless tobacco: Never  Vaping Use   Vaping Use: Never used  Substance and Sexual Activity   Alcohol use: Yes    Comment: rare   Drug use: No   Sexual activity: Yes    Partners: Male    Birth control/protection: Post-menopausal  Other Topics Concern   Not on file  Social History Narrative   Not on file   Social Determinants of Health   Financial Resource Strain: Not on file  Food Insecurity: Not on file  Transportation Needs: Not on file  Physical Activity: Not on file  Stress: Not on file  Social Connections: Not on file    Current Medications:  Current Outpatient Medications:    alum & mag hydroxide-simeth (MAALOX/MYLANTA) 200-200-20 MG/5ML suspension, Take 30 mLs by mouth every 4 (four) hours as needed for up to 6 doses for indigestion or heartburn., Disp: 180 mL, Rfl: 0   Apoaequorin (PREVAGEN PO), Take 1 tablet by mouth in the morning., Disp: , Rfl:    Cranberry-Vitamin C (CRANBERRY CONCENTRATE/VITAMINC PO), Take 2 tablets by mouth in the morning., Disp: , Rfl:    famotidine (PEPCID) 20 MG tablet, Take 1 tablet (20 mg total) by mouth daily. Can take a second dose as needed daily, Disp: 40 tablet, Rfl: 3   FLUoxetine (PROZAC) 20 MG tablet, Take 10 mg by mouth every evening., Disp: , Rfl:    Omega-3 Fatty Acids (FISH OIL CONCENTRATE PO), Take 1 capsule by mouth 3 (three) times a week., Disp: , Rfl:    Probiotic Product (PROBIOTIC PO), Take 1 capsule by mouth in the morning. OLLY [employer] Probiotic Capsules, Disp: , Rfl:    traZODone (DESYREL) 50 MG tablet, Take 1 tablet (50 mg total) by  mouth at bedtime., Disp: 30 tablet, Rfl: 3  Review of Systems: + joint and back pain. Denies appetite changes, fevers, chills, fatigue, unexplained weight changes. Denies hearing loss, neck lumps or masses, mouth sores, ringing in ears or voice changes. Denies cough or wheezing.  Denies shortness of breath. Denies chest pain or palpitations. Denies leg swelling. Denies abdominal distention, pain, blood in stools, constipation, diarrhea, nausea, vomiting, or early satiety. Denies pain  with intercourse, dysuria, frequency, hematuria or incontinence. Denies hot flashes, pelvic pain, vaginal bleeding or vaginal discharge.   Denies muscle pain/cramps. Denies itching, rash, or wounds. Denies dizziness, headaches, numbness or seizures. Denies swollen lymph nodes or glands, denies easy bruising or bleeding. Denies anxiety, depression, confusion, or decreased concentration.  Physical Exam: BP 137/73 (BP Location: Left Arm, Patient Position: Sitting)   Pulse 82   Temp 98.4 F (36.9 C) (Oral)   Resp 16   Ht $R'5\' 3"'jM$  (1.6 m)   Wt 136 lb 6.4 oz (61.9 kg)   SpO2 100%   BMI 24.16 kg/m  General: Alert, oriented, no acute distress. HEENT: Normocephalic, atraumatic, sclera anicteric. Chest: Unlabored breathing on room air.  Laboratory & Radiologic Studies: CT A/P on 10/12: IMPRESSION: Stable bulky calcified retroperitoneal lymphadenopathy, consistent with metastatic disease.   Stable right hepatic lobe lesion, likely representing a benign hemangioma. This shoeds no FDG uptake on previous PET-CT scan.   No evidence of new or progressive disease within the abdomen or pelvis.  Assessment & Plan: [name] [name] is a 65 y.o. woman with Stage IIIC presumed primary peritoneal carcinoma, low-grade, who presents for treatment discussion.  Reviewed recent CT scan with the patient and her husband and well as CA-125, which increased most recently.Marland Kitchen Unfortunately, there has been no improvement in  disease burden after 3 cycles of adjuvant therapy. Additionally, she had significant toxicity from treatment.   Given findings at the time of her surgery, additional attempt at surgery can not be undertaken here. We had previously discussed that any attempt at surgical debulking of adenopathy would require access to a blood bank and vascular surgery. I offered to refer her to one of my partners at [facility] for further discussion of potential surgery. I also offered to have her case presented at tumor board at [facility]. If the consensus is that surgery is feasible, then she could be referred for evaluation and coordination.   From a therapy standpoint, if surgery is not pursued, my recommendation would be to proceed with hormonal therapy, either with an AI or Tamoxifen. She has previously been counseled on side effects from hormonal therapy and has concerns about tolerating treatment. She has not had a bone density scan since 2019. She will call her PCP and have the report faxed here. I will recommend getting a bone density scan.   The patient is amenable to proceeding with bone density scan. We will await these results and discussion of her case for consider of surgery at [facility]. She is amenable if surgery not thought feasible, pending bone density results, to consider hormonal therapy. Plan would be for 3 months of therapy followed by repeat imaging.   38 minutes of total time was spent for this patient encounter, including preparation, face-to-face counseling with the patient and coordination of care, and documentation of the encounter.  Jeral Pinch, MD  Division of Gynecologic Oncology  Department of Obstetrics and Gynecology  [facility]

## 2021-04-01 ENCOUNTER — Ambulatory Visit (HOSPITAL_BASED_OUTPATIENT_CLINIC_OR_DEPARTMENT_OTHER)
Admission: RE | Admit: 2021-04-01 | Discharge: 2021-04-01 | Disposition: A | Discharge status: Home / Self Care | Payer: Medicare Other | Source: Ambulatory Visit | Attending: Gynecologic Oncology | Admitting: Gynecologic Oncology

## 2021-04-01 ENCOUNTER — Other Ambulatory Visit: Payer: Self-pay

## 2021-04-01 DIAGNOSIS — M8589 Other specified disorders of bone density and structure, multiple sites: Secondary | ICD-10-CM | POA: Insufficient documentation

## 2021-04-01 DIAGNOSIS — C481 Malignant neoplasm of specified parts of peritoneum: Secondary | ICD-10-CM | POA: Diagnosis present

## 2021-04-01 DIAGNOSIS — Z780 Asymptomatic menopausal state: Secondary | ICD-10-CM | POA: Diagnosis not present

## 2021-04-01 DIAGNOSIS — Z13820 Encounter for screening for osteoporosis: Secondary | ICD-10-CM | POA: Diagnosis not present

## 2021-04-02 ENCOUNTER — Encounter: Payer: Self-pay | Admitting: Gynecologic Oncology

## 2021-04-02 ENCOUNTER — Other Ambulatory Visit: Payer: Self-pay | Admitting: Gynecologic Oncology

## 2021-04-02 DIAGNOSIS — K589 Irritable bowel syndrome without diarrhea: Secondary | ICD-10-CM

## 2021-04-04 ENCOUNTER — Emergency Department (HOSPITAL_BASED_OUTPATIENT_CLINIC_OR_DEPARTMENT_OTHER): Payer: Medicare Other

## 2021-04-04 ENCOUNTER — Encounter (HOSPITAL_COMMUNITY): Payer: Medicare Other

## 2021-04-04 ENCOUNTER — Emergency Department (HOSPITAL_COMMUNITY)
Admission: EM | Admit: 2021-04-04 | Discharge: 2021-04-04 | Disposition: A | Discharge status: Home / Self Care | Payer: Medicare Other | Attending: Emergency Medicine | Admitting: Emergency Medicine

## 2021-04-04 DIAGNOSIS — Z7901 Long term (current) use of anticoagulants: Secondary | ICD-10-CM | POA: Insufficient documentation

## 2021-04-04 DIAGNOSIS — I82C21 Chronic embolism and thrombosis of right internal jugular vein: Secondary | ICD-10-CM | POA: Diagnosis not present

## 2021-04-04 DIAGNOSIS — Z8589 Personal history of malignant neoplasm of other organs and systems: Secondary | ICD-10-CM | POA: Insufficient documentation

## 2021-04-04 DIAGNOSIS — I82C29 Chronic embolism and thrombosis of unspecified internal jugular vein: Secondary | ICD-10-CM

## 2021-04-04 DIAGNOSIS — Z91040 Latex allergy status: Secondary | ICD-10-CM | POA: Diagnosis not present

## 2021-04-04 DIAGNOSIS — M542 Cervicalgia: Secondary | ICD-10-CM | POA: Diagnosis present

## 2021-04-04 NOTE — ED Triage Notes (Signed)
Patient reports pain in right side of neck, 3/10. History of blood clot in same spot .

## 2021-04-04 NOTE — Discharge Instructions (Signed)
Patient in the ER for your neck pain.  Your ultrasound revealed an chronic clot in your jugular vein but no progression of your clot or new clot.  You may continue with plan to have your port removed on Tuesday and should continue to hold your Eliquis as directed by your outpatient providers.  Return to the ER with any worsening neck pain, numbness, ting, weakness in your arm, headache, blurred or double vision, or any other new severe symptom.

## 2021-04-04 NOTE — Progress Notes (Signed)
RUE venous duplex has been completed.  Preliminary results given to Silverio Decamp, PA.  Results can be found under chart review under CV PROC. 04/04/2021 4:33 PM Danyel Griess RVT, RDMS

## 2021-04-04 NOTE — ED Provider Notes (Signed)
[facility] DEPT Provider Note   CSN: [id number] Arrival date & time: 04/04/21  1022     History Chief Complaint  Patient presents with   blood clot    [name] [name] is a 65 y.o. female who presents with worsening right neck pain.  Patient with history of jugular venous DVT secondary to Port-A-Cath.  Recently completed chemotherapy treatment for stage IV ovarian cancer.  States that last time she felt pain like this she did not have any DVT.  Last dose of Eliquis was last night.  She is post to have Port-A-Cath removed in 48 hours.  I personally reviewed this patient's medical records.  She has history of melanoma, peritoneal carcinoma secondary to ovarian cancer which is now metastatic, anticoagulated on Eliquis with last dose last night.  Pending decision regarding hormone therapy for her malignancy.  HPI     Past Medical History:  Diagnosis Date   Arthritis    Chronic pain    Contact lens/glasses fitting    wears contacts or glasses   Depression    Fibromyalgia    Melanoma ([place])    Peritoneal carcinoma ([place])    PONV (postoperative nausea and vomiting)    prolonged sedation very hard to wake    Patient Active Problem List   Diagnosis Date Noted   Goals of care, counseling/discussion 03/12/2021   Insomnia 02/19/2021   Other constipation 02/19/2021   Internal jugular vein thrombosis, right ([place]) 02/19/2021   Extraovarian primary peritoneal carcinoma ([place]) 12/15/2020   Diarrhea 12/15/2020   Pelvic mass in female 12/01/2020   Adenopathy 11/24/2020   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, unspecified laterality ([place]) 11/16/2020   History of lumbar surgery 12/26/2018   Lower abdominal pain 12/04/2018   Mixed hyperlipidemia 02/05/2018   Elevated blood pressure reading 01/26/2016   Chronic low back pain 11/03/2015   Fibromyositis 11/03/2015   Irritable bowel syndrome 11/03/2015   Recurrent major depressive disorder, in  remission ([place]) 11/03/2015   Chronic pain syndrome 06/26/2012    Past Surgical History:  Procedure Laterality Date   BACK SURGERY     lumb lam   BREAST BIOPSY Right    2021   BREAST BIOPSY Left    BREAST ENHANCEMENT SURGERY  05/30/1980   implants    BREAST IMPLANT EXCHANGE  05/30/2009   CARPAL TUNNEL RELEASE  05/30/2001   rt and lt   CARPOMETACARPEL SUSPENSION PLASTY  06/22/2012   Procedure: CARPOMETACARPEL ([place]) SUSPENSION PLASTY;  Surgeon: Alta Corning, MD;  Location: [facility];  Service: Orthopedics;  Laterality: Left;  burton's interposition arthroplasty left thumb    CATARACT EXTRACTION W/ INTRAOCULAR LENS  IMPLANT, BILATERAL     COLONOSCOPY     x2   DIAGNOSTIC LAPAROSCOPY  05/30/2000   expl lap   DILATION AND CURETTAGE OF UTERUS  05/30/2001   ablation   ELBOW SURGERY  05/31/1999   decompression rt   EPIDURAL BLOCK INJECTION     multiple   IR IMAGING GUIDED PORT INSERTION  01/04/2021   LYMPH NODE DISSECTION N/A 12/01/2020   Procedure: LYMPH NODE DISSECTION;  Surgeon: Lafonda Mosses, MD;  Location: [facility];  Service: Gynecology;  Laterality: N/A;   SKIN CANCER EXCISION     melanoma, from abdomen   TONSILLECTOMY       OB History     Gravida  1   Para  1   Term      Preterm  AB      Living         SAB      IAB      Ectopic      Multiple      Live Births              Family History  Problem Relation Age of Onset   Lung cancer Father    Colon cancer Neg Hx    Breast cancer Neg Hx    Ovarian cancer Neg Hx    Endometrial cancer Neg Hx    Pancreatic cancer Neg Hx    Prostate cancer Neg Hx     Social History   Tobacco Use   Smoking status: Never   Smokeless tobacco: Never  Vaping Use   Vaping Use: Never used  Substance Use Topics   Alcohol use: Yes    Comment: rare   Drug use: No    Home Medications Prior to Admission medications   Medication Sig Start Date End Date Taking? Authorizing Provider  alum & mag  hydroxide-simeth (MAALOX/MYLANTA) 200-200-20 MG/5ML suspension Take 30 mLs by mouth every 4 (four) hours as needed for up to 6 doses for indigestion or heartburn. 12/03/20  Yes Lafonda Mosses, MD  apixaban (ELIQUIS) 5 MG TABS tablet Take 5 mg by mouth 2 (two) times daily.   Yes [provider]  FLUoxetine (PROZAC) 20 MG tablet Take 10 mg by mouth every evening. 09/24/20  Yes [provider]  Misc Natural Products (ELDERBERRY IMMUNE COMPLEX PO) Take 5 mLs by mouth daily.   Yes [provider]  Probiotic Product (PROBIOTIC PO) Take 1 capsule by mouth in the morning.   Yes [provider]  traZODone (DESYREL) 50 MG tablet Take 1 tablet (50 mg total) by mouth at bedtime. 02/19/21  Yes Gorsuch, Ni, MD  famotidine (PEPCID) 20 MG tablet Take 1 tablet (20 mg total) by mouth daily. Can take a second dose as needed daily Patient not taking: Reported on 04/04/2021 12/07/20 05/16/21  Lafonda Mosses, MD  prochlorperazine (COMPAZINE) 10 MG tablet Take 1 tablet (10 mg total) by mouth every 6 (six) hours as needed (Nausea or vomiting). 12/15/20 03/12/21  Heath Lark, MD    Allergies    Ciprofloxacin-ciproflox hcl er, Amoxicillin, Bupropion, Codeine, Duloxetine, Methylphenidate, Other, and Latex  Review of Systems   Review of Systems  Constitutional: Negative.   HENT: Negative.    Respiratory: Negative.    Cardiovascular: Negative.   Gastrointestinal: Negative.   Genitourinary: Negative.   Musculoskeletal:  Positive for neck pain. Negative for back pain, gait problem, joint swelling, myalgias and neck stiffness.  Skin: Negative.   Neurological: Negative.   Hematological: Negative.    Physical Exam Updated Vital Signs BP (!) 163/86   Pulse 68   Temp 98 F (36.7 C) (Oral)   Resp 18   Ht 5\' 3"  (1.6 m)   Wt 61.9 kg   SpO2 100%   BMI 24.16 kg/m   Physical Exam Vitals and nursing note reviewed.  Constitutional:      Appearance: She is not ill-appearing or  toxic-appearing.  HENT:     Head: Normocephalic and atraumatic.     Nose: Nose normal. No congestion.     Mouth/Throat:     Mouth: Mucous membranes are moist.     Pharynx: No oropharyngeal exudate or posterior oropharyngeal erythema.  Eyes:     General:        Right eye: No discharge.  Left eye: No discharge.     Extraocular Movements: Extraocular movements intact.     Conjunctiva/sclera: Conjunctivae normal.     Pupils: Pupils are equal, round, and reactive to light.  Neck:     Vascular: No carotid bruit.     Trachea: Trachea and phonation normal.   Cardiovascular:     Rate and Rhythm: Normal rate and regular rhythm.     Pulses: Normal pulses.     Heart sounds: Normal heart sounds. No murmur heard. Pulmonary:     Effort: Pulmonary effort is normal. No tachypnea, bradypnea, accessory muscle usage, prolonged expiration or respiratory distress.     Breath sounds: Normal breath sounds. No wheezing or rales.  Chest:     Chest wall: No mass, lacerations, deformity, swelling, tenderness, crepitus or edema.  Abdominal:     General: Bowel sounds are normal. There is no distension.     Palpations: Abdomen is soft.     Tenderness: There is no abdominal tenderness. There is no right CVA tenderness, left CVA tenderness, guarding or rebound.  Musculoskeletal:        General: No deformity.     Cervical back: Normal range of motion and neck supple. No edema, rigidity, tenderness or crepitus. No pain with movement, spinous process tenderness or muscular tenderness.     Left lower leg: No edema.  Lymphadenopathy:     Cervical: No cervical adenopathy.  Skin:    General: Skin is warm and dry.     Capillary Refill: Capillary refill takes less than 2 seconds.     Findings: No rash.  Neurological:     General: No focal deficit present.     Mental Status: She is alert and oriented to person, place, and time. Mental status is at baseline.  Psychiatric:        Mood and Affect: Mood normal.     ED Results / Procedures / Treatments   Labs (all labs ordered are listed, but only abnormal results are displayed) Labs Reviewed - No data to display  EKG None  Radiology VAS [place] UPPER EXTREMITY VENOUS DUPLEX  Result Date: 04/04/2021 UPPER VENOUS STUDY  Patient Name:  BLESSINGS INGLETT O'DONNELL  Date of Exam:   04/04/2021 Medical Rec #: [id number]              Accession #:    [id number] Date of Birth: November 12, 1955              Patient Gender: F Patient Age:   19 years Exam Location:  [facility] Procedure:      VAS [place] UPPER EXTREMITY VENOUS DUPLEX Referring Phys: [facility] Dina [place] --------------------------------------------------------------------------------  Indications: right sided neck pain Risk Factors: Cancer patient with right side port placement. Anticoagulation: Eliquis. Comparison Study: previous exam on 02/05/2021 was positive for DVT in RT IJV Performing Technologist: Rogelia Rohrer RVT, RDMS  Examination Guidelines: A complete evaluation includes B-mode imaging, spectral Doppler, color Doppler, and power Doppler as needed of all accessible portions of each vessel. Bilateral testing is considered an integral part of a complete examination. Limited examinations for reoccurring indications may be performed as noted.  Right Findings: +----------+------------+---------+-----------+----------+-------+ RIGHT     CompressiblePhasicitySpontaneousPropertiesSummary +----------+------------+---------+-----------+----------+-------+ IJV         Partial      Yes       Yes              Chronic +----------+------------+---------+-----------+----------+-------+ Subclavian    Full       Yes  Yes                      +----------+------------+---------+-----------+----------+-------+ Axillary      Full       Yes       Yes                      +----------+------------+---------+-----------+----------+-------+ Brachial      Full                                           +----------+------------+---------+-----------+----------+-------+ Radial        Full                                          +----------+------------+---------+-----------+----------+-------+ Ulnar         Full                                          +----------+------------+---------+-----------+----------+-------+ Cephalic      Full                                          +----------+------------+---------+-----------+----------+-------+ Basilic       Full       Yes       Yes                      +----------+------------+---------+-----------+----------+-------+  Left Findings: +----------+------------+---------+-----------+----------+-------+ LEFT      CompressiblePhasicitySpontaneousPropertiesSummary +----------+------------+---------+-----------+----------+-------+ Subclavian    Full       Yes       Yes                      +----------+------------+---------+-----------+----------+-------+  Summary:  Right: No evidence of superficial vein thrombosis in the upper extremity. 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  Left: No evidence of thrombosis in the subclavian.  *See table(s) above for measurements and observations.  Diagnosing physician: Monica Martinez MD Electronically signed by Monica Martinez MD on 04/04/2021 at 5:12:00 PM.    Final     Procedures Procedures   Medications Ordered in ED Medications - No data to display  ED Course  I have reviewed the triage vital signs and the nursing notes.  Pertinent labs & imaging results that were available during my care of the patient were reviewed by me and considered in my medical decision making (see chart for details).    MDM Rules/Calculators/A&P                         65 year old female presents with right neck pain with Port-A-Cath catheter in the right neck.  History of right jugular DVT which was nonocclusive.  Typically  on Eliquis but not anticoagulated since last night due to plan for port removal in 48 hours.  Hypertensive on intake, vital signs otherwise normal.  Cardiopulmonary exam is normal, abdominal exam is benign.  No carotid bruit bilaterally.  Patient has palpable Port-A-Cath catheter in the right neck with  mild tenderness to palpation.  Normal pulses in the upper extremities bilaterally, normal sensation and strength in the upper extremities bilaterally.  Vascular ultrasound Obtained of the right upper extremity which revealed no evidence of acute DVT.  Does have a history of chronic nonocclusive DVT involving the right internal jugular vein.  No thrombosis of the subclavian on the left.  Case discussed with attending physician Dr. Kathrynn Humble who assumed position of previous ED attending Dr. Zenia Resides.  Agrees with plan to discharge given no acute DVT.  We will continue to hold anticoagulation so that patient may have Port-A-Cath removed on Tuesday, 04/06/2021.  Cherese voiced understanding of her medical evaluation and treatment plan.  Each of her questions was answered to her expressed satisfaction.  Strict return precautions were given.  Patient is well-appearing, stable, and appropriate for discharge at this time.    This chart was dictated using voice recognition software, Dragon. Despite the best efforts of this provider to proofread and correct errors, errors may still occur which can change documentation meaning.   Final Clinical Impression(s) / ED Diagnoses Final diagnoses:  Chronic deep vein thrombosis (DVT) of internal jugular vein [facility])    Rx / DC Orders ED Discharge Orders     None        Aura Dials 04/04/21 1735    Lacretia Leigh, MD 04/06/21 1554

## 2021-04-05 ENCOUNTER — Other Ambulatory Visit (HOSPITAL_COMMUNITY): Payer: Self-pay

## 2021-04-05 ENCOUNTER — Telehealth: Payer: Self-pay | Admitting: Gynecologic Oncology

## 2021-04-05 ENCOUNTER — Telehealth: Payer: Self-pay | Admitting: *Deleted

## 2021-04-05 DIAGNOSIS — C569 Malignant neoplasm of unspecified ovary: Secondary | ICD-10-CM

## 2021-04-05 MED ORDER — LETROZOLE 2.5 MG PO TABS: 2.5000 mg | ORAL_TABLET | Freq: Every day | ORAL | 3 refills | Status: DC

## 2021-04-05 NOTE — Telephone Encounter (Signed)
Spoke with the patient.  She is wanting to move forward with starting hormonal therapy.  Discussed option of letrozole versus tamoxifen.  She is amenable to letrozole and will keep me posted regarding side effects.  Prescription sent for 3 months to her pharmacy.  Jeral Pinch MD Gynecologic Oncology

## 2021-04-05 NOTE — Telephone Encounter (Signed)
Patient called and stated that she like to try a three trial of hormone therapy. Patient requested the script to be called into [facility].

## 2021-04-06 ENCOUNTER — Other Ambulatory Visit: Payer: Self-pay

## 2021-04-06 ENCOUNTER — Telehealth: Payer: Self-pay

## 2021-04-06 ENCOUNTER — Ambulatory Visit (HOSPITAL_COMMUNITY)
Admission: RE | Admit: 2021-04-06 | Discharge: 2021-04-06 | Disposition: A | Discharge status: Home / Self Care | Payer: Medicare Other | Source: Ambulatory Visit | Attending: Hematology and Oncology | Admitting: Hematology and Oncology

## 2021-04-06 DIAGNOSIS — C481 Malignant neoplasm of specified parts of peritoneum: Secondary | ICD-10-CM | POA: Insufficient documentation

## 2021-04-06 DIAGNOSIS — Z452 Encounter for adjustment and management of vascular access device: Secondary | ICD-10-CM | POA: Insufficient documentation

## 2021-04-06 HISTORY — PX: IR REMOVAL TUN ACCESS W/ PORT W/O FL MOD SED: IMG2290

## 2021-04-06 MED ORDER — LIDOCAINE-EPINEPHRINE 1 %-1:100000 IJ SOLN
INTRAMUSCULAR | Status: DC | PRN
  Administered 2021-04-06: 10 mL

## 2021-04-06 MED ORDER — LIDOCAINE-EPINEPHRINE (PF) 2 %-1:200000 IJ SOLN
INTRAMUSCULAR | Status: AC
  Filled 2021-04-06: qty 20

## 2021-04-06 NOTE — Telephone Encounter (Signed)
Returned patient's call regarding clarification on need for blood thinners. Per Dr. Alvy Bimler, patient does not need to be on blood thinners following recent port removal.  Patient verbalized an understanding of the information and had no further questions.

## 2021-04-12 ENCOUNTER — Ambulatory Visit: Payer: Medicare Other | Admitting: Gastroenterology

## 2021-04-12 ENCOUNTER — Encounter: Payer: Self-pay | Admitting: Gastroenterology

## 2021-04-12 VITALS — BP 130/80 | HR 58 | Ht 62.5 in | Wt 136.0 lb

## 2021-04-12 DIAGNOSIS — R1084 Generalized abdominal pain: Secondary | ICD-10-CM

## 2021-04-12 DIAGNOSIS — R194 Change in bowel habit: Secondary | ICD-10-CM

## 2021-04-12 DIAGNOSIS — R110 Nausea: Secondary | ICD-10-CM | POA: Diagnosis not present

## 2021-04-12 MED ORDER — HYOSCYAMINE SULFATE 0.125 MG SL SUBL: 0.1250 mg | SUBLINGUAL_TABLET | Freq: Four times a day (QID) | SUBLINGUAL | 1 refills | Status: DC | PRN

## 2021-04-12 NOTE — Patient Instructions (Signed)
Start Benefiber 2 teaspoons in 8 ounces of liquid daily and may increase to twice daily if tolerated.   We have sent the following medications to your pharmacy for you to pick up at your convenience: Levsin 0.125 mg every 6 hours as needed.   Call back in 3-4 weeks with an update or send a mychart message.  If you are age 66 or older, your body mass index should be between 23-30. Your Body mass index is 24.48 kg/m. If this is out of the aforementioned range listed, please consider follow up with your Primary Care Provider.  If you are age 4 or younger, your body mass index should be between 19-25. Your Body mass index is 24.48 kg/m. If this is out of the aformentioned range listed, please consider follow up with your Primary Care Provider.   ________________________________________________________  The [facility] providers would like to encourage you to use [facility] to communicate with providers for non-urgent requests or questions.  Due to long hold times on the telephone, sending your provider a message by [facility] may be a faster and more efficient way to get a response.  Please allow 48 business hours for a response.  Please remember that this is for non-urgent requests.  _______________________________________________________

## 2021-04-12 NOTE — Progress Notes (Signed)
04/12/2021 [name] [name] [id number] [birth year]   HISTORY OF PRESENT ILLNESS: This is a 65 year old female who is new to our office.  She has been referred here by Dr. Gaylyn Rong for evaluation of irritable bowel syndrome.  Today she reports generalized abdominal pain and nausea.  Reports mid abdominal pain when she eats as well as frequent nausea and sensation of incomplete emptying of her bowels.  She has noncurable ovarian cancer.  Had surgery on July 5, but still has bulky retroperitoneal lymphadenopathy consistent with metastatic disease.  Chemo did not help her disease.  She is really wanting help with her GI symptoms so that she can do what she wants to do with the time that she still has left to do it.  She reports having had a colonoscopy within the past couple of years in [place], she believes with Dr. Lyda Jester; from what she says it was normal.  CT scan did not show any gastrointestinal issues.  She reports some longstanding GI issues over the years.  She describes having some abdominal pain most of the time, but says for the most part is not severe.  This is usually on the left side to the mid abdomen.  She says that she feels nauseated a lot.  She does have some antiemetics at home.  She has tried Metamucil in the past for her bowel issues, but it made her very bloated.  She feels that she is back and forth to the bathroom several times in a row, only passing small amounts of stool at a time.   Past Medical History:  Diagnosis Date   Arthritis    Chronic pain    Contact lens/glasses fitting    wears contacts or glasses   Depression    Fibromyalgia    Melanoma ([place])    Peritoneal carcinoma ([place])    PONV (postoperative nausea and vomiting)    prolonged sedation very hard to wake   Past Surgical History:  Procedure Laterality Date   BACK SURGERY     lumb lam   BREAST BIOPSY Right    2021   BREAST BIOPSY Left    BREAST ENHANCEMENT SURGERY  05/30/1980    implants    BREAST IMPLANT EXCHANGE  05/30/2009   CARPAL TUNNEL RELEASE  05/30/2001   rt and lt   CARPOMETACARPEL SUSPENSION PLASTY  06/22/2012   Procedure: CARPOMETACARPEL ([place]) SUSPENSION PLASTY;  Surgeon: Alta Corning, MD;  Location: [facility];  Service: Orthopedics;  Laterality: Left;  burton's interposition arthroplasty left thumb    CATARACT EXTRACTION W/ INTRAOCULAR LENS  IMPLANT, BILATERAL     COLONOSCOPY     x2   DIAGNOSTIC LAPAROSCOPY  05/30/2000   expl lap   DILATION AND CURETTAGE OF UTERUS  05/30/2001   ablation   ELBOW SURGERY  05/31/1999   decompression rt   EPIDURAL BLOCK INJECTION     multiple   IR IMAGING GUIDED PORT INSERTION  01/04/2021   IR REMOVAL TUN ACCESS W/ PORT W/O FL MOD SED  04/06/2021   LYMPH NODE DISSECTION N/A 12/01/2020   Procedure: LYMPH NODE DISSECTION;  Surgeon: Lafonda Mosses, MD;  [facility]: [facility];  Service: Gynecology;  Laterality: N/A;   SKIN CANCER EXCISION     melanoma, from abdomen   TONSILLECTOMY      reports that she has never smoked. She has never used smokeless tobacco. She reports current alcohol use. She reports that she does not use drugs. family history  includes Lung cancer in her father. Allergies  Allergen Reactions   Ciprofloxacin-Ciproflox Hcl Er Nausea And Vomiting   Amoxicillin Nausea And Vomiting    Pt received ancef on 12-01-20 without issue   Bupropion     Other reaction(s): GI Upset (intolerance)   Codeine Nausea And Vomiting   Duloxetine     Other reaction(s): Other (See Comments) High blood pressure response   Methylphenidate     Other reaction(s): Other (See Comments) BLISTERS   Other     Narcotics=nausea/vomiting (sick) & behavioral changes ("makes me crazy")   Latex Rash      Outpatient Encounter Medications as of 04/12/2021  Medication Sig   alum & mag hydroxide-simeth (MAALOX/MYLANTA) 200-200-20 MG/5ML suspension Take 30 mLs by mouth every 4 (four) hours as needed for up to 6 doses for  indigestion or heartburn.   famotidine (PEPCID) 20 MG tablet Take 1 tablet (20 mg total) by mouth daily. Can take a second dose as needed daily   FLUoxetine (PROZAC) 20 MG tablet Take 10 mg by mouth every evening.   hyoscyamine (LEVSIN SL) 0.125 MG SL tablet Place 1 tablet (0.125 mg total) under the tongue every 6 (six) hours as needed.   letrozole (FEMARA) 2.5 MG tablet Take 1 tablet (2.5 mg total) by mouth daily.   Misc Natural Products (ELDERBERRY IMMUNE COMPLEX PO) Take 5 mLs by mouth daily.   Probiotic Product (PROBIOTIC PO) Take 1 capsule by mouth in the morning.   traZODone (DESYREL) 50 MG tablet Take 1 tablet (50 mg total) by mouth at bedtime.   apixaban (ELIQUIS) 5 MG TABS tablet Take 5 mg by mouth 2 (two) times daily. (Patient not taking: Reported on 04/12/2021)   [DISCONTINUED] prochlorperazine (COMPAZINE) 10 MG tablet Take 1 tablet (10 mg total) by mouth every 6 (six) hours as needed (Nausea or vomiting).   No facility-administered encounter medications on file as of 04/12/2021.     REVIEW OF SYSTEMS  : All other systems reviewed and negative except where noted in the History of Present Illness.   PHYSICAL EXAM: BP 130/80   Pulse (!) 58   Ht 5' 2.5" (1.588 m)   Wt 136 lb (61.7 kg)   BMI 24.48 kg/m  General: Well developed white female in no acute distress Head: Normocephalic and atraumatic Eyes:  Sclerae anicteric, conjunctiva pink. Ears: Normal auditory acuity Lungs: Clear throughout to auscultation; no W/R/R. Heart: Regular rate and rhythm; no M/R/G Abdomen: Soft, non-distended.  BS present.  Some TTP, which tends to be more over her scars from her recent abdominal surgery. Musculoskeletal: Symmetrical with no gross deformities  Skin: No lesions on visible extremities Extremities: No edema  Neurological: Alert oriented x 4, grossly non-focal Psychological:  Alert and cooperative. Normal mood and affect  ASSESSMENT AND PLAN: *Generalized abdominal pain and nausea:  Reports mid abdominal pain when she eats as well as frequent nausea and sensation of incomplete emptying of her bowels.  She has noncurable ovarian cancer.  Had surgery on July 5, but still has bulky retroperitoneal lymphadenopathy consistent with metastatic disease.  Chemo did not help her disease.  She is really wanting help with her GI symptoms so that she can do what she wants to do with the time that she still has left to do it.  She reports having had a colonoscopy within the past couple of years in [place], she believes with Dr. Lyda Jester.  We will try to request those records, but from what she says it was  normal.  CT scan did not show any gastrointestinal issues.  She reports some longstanding GI issues over the years, so some of this could be a component of IBS.  Possibly also somewhat related to her malignancy and also some of her pain may be from her surgery with some nerve damage in that area, etc.  We we will try Levsin 0.125 mg every 6 hours as needed for abdominal pain.  She preferred to try try something as needed rather than taking something regularly.  She has antiemetics at home that she can use.  We will await colonoscopy records.  I have also asked her to begin taking Benefiber 2 teaspoons mixed with 8 ounces of liquid daily and if tolerated then to increase it to twice daily to try to help with bulking of her stools and eliminating more completely.   CC:  Algis Greenhouse, MD CC: Dr. Gaylyn Rong

## 2021-04-13 NOTE — Progress Notes (Signed)
Agree with the assessment and plan as outlined by Alonza Bogus, PA-C.  Consider addition of a TCA if Levsin not helpful for her pain  Khori Underberg E. Candis Schatz, MD  [facility]

## 2021-04-19 ENCOUNTER — Telehealth: Payer: Self-pay | Admitting: Oncology

## 2021-04-19 DIAGNOSIS — C481 Malignant neoplasm of specified parts of peritoneum: Secondary | ICD-10-CM

## 2021-04-19 NOTE — Telephone Encounter (Signed)
Called [name] [name] back and advised her that Dr. Berline Lopes doesn't think the letrozole is causing the tooth aching. She said to let us know if the aching gets worse to consider stopping the letrozole.  She verbalized understanding and said she it is feeling better today.

## 2021-04-19 NOTE — Telephone Encounter (Addendum)
Called [name] [name] regarding genetic testing.  She is interested.  Appointment scheduled for 04/29/2021.    She also said she is having aching in her teeth since starting letrozole.  She started taking it last Monday and woke up during the night with her teeth aching (worse on the right side).  She went to the dentist on Friday and had x-rays and was told they did not see any problems.  She was started on a Z Pack just in case and will take her last one tomorrow. She said it is a little better but is wondering if this could be from the hormones.  She is also having some aching in her neck and shoulders as well.

## 2021-04-21 ENCOUNTER — Telehealth: Payer: Self-pay | Admitting: *Deleted

## 2021-04-21 NOTE — Telephone Encounter (Signed)
Faxed records release to Dr. Lyda Jester office at [place] and they did not have any records on patient.

## 2021-04-29 ENCOUNTER — Other Ambulatory Visit: Payer: Self-pay | Admitting: Genetic Counselor

## 2021-04-29 ENCOUNTER — Inpatient Hospital Stay: Payer: Medicare Other | Attending: Hematology and Oncology | Admitting: Genetic Counselor

## 2021-04-29 ENCOUNTER — Inpatient Hospital Stay (HOSPITAL_BASED_OUTPATIENT_CLINIC_OR_DEPARTMENT_OTHER): Payer: Medicare Other | Admitting: Physician Assistant

## 2021-04-29 ENCOUNTER — Inpatient Hospital Stay: Payer: Medicare Other

## 2021-04-29 ENCOUNTER — Other Ambulatory Visit: Payer: Self-pay

## 2021-04-29 ENCOUNTER — Encounter: Payer: Self-pay | Admitting: Genetic Counselor

## 2021-04-29 VITALS — BP 156/89 | HR 63 | Resp 16

## 2021-04-29 DIAGNOSIS — Z86718 Personal history of other venous thrombosis and embolism: Secondary | ICD-10-CM | POA: Insufficient documentation

## 2021-04-29 DIAGNOSIS — Z90722 Acquired absence of ovaries, bilateral: Secondary | ICD-10-CM | POA: Diagnosis not present

## 2021-04-29 DIAGNOSIS — Z90710 Acquired absence of both cervix and uterus: Secondary | ICD-10-CM | POA: Diagnosis not present

## 2021-04-29 DIAGNOSIS — Z9079 Acquired absence of other genital organ(s): Secondary | ICD-10-CM | POA: Diagnosis not present

## 2021-04-29 DIAGNOSIS — C481 Malignant neoplasm of specified parts of peritoneum: Secondary | ICD-10-CM

## 2021-04-29 DIAGNOSIS — Z808 Family history of malignant neoplasm of other organs or systems: Secondary | ICD-10-CM | POA: Diagnosis not present

## 2021-04-29 DIAGNOSIS — Z7901 Long term (current) use of anticoagulants: Secondary | ICD-10-CM | POA: Diagnosis not present

## 2021-04-29 DIAGNOSIS — C482 Malignant neoplasm of peritoneum, unspecified: Secondary | ICD-10-CM | POA: Insufficient documentation

## 2021-04-29 DIAGNOSIS — Z5189 Encounter for other specified aftercare: Secondary | ICD-10-CM | POA: Diagnosis not present

## 2021-04-29 HISTORY — DX: Family history of malignant neoplasm of other organs or systems: Z80.8

## 2021-04-29 LAB — GENETIC SCREENING ORDER

## 2021-04-29 NOTE — Progress Notes (Signed)
Symptom Management Consult note [place]    Patient Care Team: Algis Greenhouse, MD as PCP - General (Unknown Physician Specialty) Lafonda Mosses, MD as Consulting Physician (Gynecologic Oncology) Heath Lark, MD as Consulting Physician (Hematology and Oncology)    Name of the patient: [name] [name]  [id number]  [birth year]   Date of visit: 04/29/2021    Chief complaint/ Reason for visit- port removal site check  Oncology History Overview Note  Low grade serous, ER positive Foundation One done on 8/9: HRD not detected, MSI stable, low tumor mutational burden 1 Muts/Mb, no alterations of BRCA1 & 2   Extraovarian primary peritoneal carcinoma ([place])  09/29/2020 Imaging   MRI lumbar spine The dominant finding in this case is that of massive retroperitoneal lymphadenopathy, likely related to either metastatic disease or lymphoma.   Chronic degenerative and postoperative findings of the spine as outlined in detail above   10/05/2020 Imaging   IMPRESSION outside CT 1. Large calcified retroperitoneal lymph nodes highly concerning for metastatic disease, likely related to primary carcinoma of the colon or lymphoma. Clinical correlation is recommended. 2. No bowel obstruction. Normal appendix. 3. Stable right hepatic dome hemangioma.   10/30/2020 PET scan   1. The densely calcified retroperitoneal adenopathy is highly hypermetabolic with maximum SUV of 23.4 (Deauville 5) there is also Deauville 3 activity in a small right axillary lymph node. Given the lack of an obvious primary outside of the retroperitoneum, appearance tends to favor lymphoma or Castleman's disease. Tissue diagnosis suggested. 2. Other imaging findings of potential clinical significance: Chronic left maxillary sinusitis. Aortic Atherosclerosis (ICD10-I70.0). Coronary atherosclerosis.   11/10/2020 Procedure   CT guided biopsy of retroperitoneal adenopathy   11/10/2020 Pathology Results   biopsy  results which favor a low-grade serous carcinoma of gyn origin. This is a somewhat atypical presentation, but my suspicion is that it is primary peritoneal carcinoma   11/16/2020 Tumor Marker   Patient's tumor was tested for the following markers: CA-125. Results of the tumor marker test revealed 96.4.   12/01/2020 Pathology Results   SURGICAL PATHOLOGY  CASE: [phone]  PATIENT: [name] [name]  Surgical Pathology Report   Clinical History: Metastatic low grade serous carcinoma of gyn origin  (c[facility])    FINAL MICROSCOPIC DIAGNOSIS:   A. UTERUS, CERVIX, BILATERAL FALLOPIAN TUBES AND OVARIES:  Uterus:  -  Inactive endometrium  -  Leiomyoma (1.1 cm; largest)  -  Calcified nodule of the broad ligament with psammoma bodies (right)  -  No hyperplasia or malignancy identified   Cervix:  -  Benign cervix  -  No dysplasia or malignancy identified   Ovaries, bilateral:  -  Serous cyst adenofibroma with microscopic focus of borderline change  (right)    Fallopian tubes, bilateral:  -  Benign fallopian tube with paratubal cyst (right)  -  No malignancy identified   B. OMENTUM, OMENTECTOMY:  -  No carcinoma identified   C. PERIAORTIC TUMOR, EXCISION:  -  Serous carcinoma, low-grade, with psammoma bodies  -  See comment below   COMMENT:   C.  At the request of the treating clinician, immunohistochemistry was repeated.  The neoplastic cells are positive for ER, cytokeratin 7, cytokeratin 5/6, p53, PAX8 and WT1 but negative for cytokeratin 20,  TTF-1, PR and thyroglobulin.  The morphology and immunophenotype are consistent with a primary peritoneal low-grade serous carcinoma (see also, [id number]).   ONCOLOGY TABLE:   OVARY or FALLOPIAN TUBE or PRIMARY PERITONEUM: Resection  Procedure: Total hysterectomy and bilateral salpingo-oophorectomy with omentectomy and periaortic tumor excision  Specimen Integrity: Intact  Tumor Site: Primary peritoneum (see comment)  Tumor Size: See  comment  Histologic Type: Low-grade serous carcinoma  Histologic Grade: Low-grade  Ovarian Surface Involvement: Not identified  Fallopian Tube Surface Involvement: Not identified  Implants (required for advanced stage serous/seromucinous borderline  tumors only): Not applicable  Other Tissue/ Organ Involvement: Periaortic tumor  Largest Extrapelvic Peritoneal Focus: 5.5 cm (aggregate measurement)  Peritoneal/Ascitic Fluid Involvement: Not applicable  Chemotherapy Response Score (CRS): Not applicable, no known presurgical therapy  Regional Lymph Nodes: Not applicable (no lymph nodes submitted or found)  Distant Metastasis:       Distant Site(s) Involved: Not applicable  Pathologic Stage Classification (pTNM, AJCC 8th Edition): pT3, pN not assigned  Ancillary Studies: Can be performed upon request  Representative Tumor Block: C1  Comment(s): The only focus of low-grade serous carcinoma identified in the resection specimen is the tissue submitted as periaortic tumor (part C).  Clinically, this was favored to represent matted lymph nodes; however, there is no evidence of nodal tissue in the examined material. This case was discussed with Dr. Berline Lopes and given the extent of the matted lymph nodes, this is being staged as a pT3.     12/01/2020 Surgery   Pre-operative Diagnosis: low grade serous carcinoma with significant retroperitoneal adenopathy, no other obvious imaging evidence of disease   Post-operative Diagnosis: same, significantly adherent presacral and para-aortic lymphadenopathy   Operation: Robotic-assisted laparoscopic total hysterectomy with bilateral salpingo-oophorectomy, laparotomy, infra-colic omentectomy, portion of necrotic tumor-ridden retroperitoneal lymph nodes, oversew of sigmoid serosa   Surgeon: Jeral Pinch MD   Assistant Surgeon: Lahoma Crocker MD (an MD assistant was necessary for tissue manipulation, management of robotic instrumentation, retraction and  positioning due to the complexity of the case and hospital policies).    Intra-operative consultant:  Everitt Amber MD    Operative Findings: On EUA, small mobile uterus. ON intra-abdominal entry, normal liver and diaphragm. One filmy adhesion noted between the right liver lobe and the anterior abdominal wall. The spleen was somewhat lateral and appeared mildly enlarged. There were some adhesions of the infragastric omentum to the anterior abdominal wall in the midline. The omentum was normal appearing without obvious evidence of tumor. Small and large bowel were normal appearing, appendix normal. Uterus 6cm and normal in appearance. Somewhat atrophic bilateral adnexa. There were filmy adhesions between each adnexa and the pelvic sidewall. Some filmy adhesions were also noted between the posterior uterus and rectum. Approximately 0.5-1cm white nodule within the broad ligament, lateral to the right cornua, c/w possible fibroma vs fibroid. No pelvic adenopathy. Significantly enlarged, necrotic, and tumor-filled lymph node conglomeration was noted starting within the presacral space (adherent to the sacral promontory) and extending superiorly along the great vessels to the level of the renal vessel (suspected). Given adherence of the involved lymph nodes, I could not visualize this anatomy, I could long palpate along each lateral aspect of the aorta. There was no identifiable plane between matted lymph nodes and underlying aorta or IVC. Given burden of disease and my concern that resection was not feasible, I asked my senior partner (Dr. Denman George) to evaluate intra-op. She agreed that moving forward with attempt at debulking involved lymph nodes was likely not feasible and an attempt at this time would be unsafe.   There was no obvious peritoneal disease and no clear involvement of gyn organs. Despite prior biopsy, findings at surgery remain concerning for a  non-gynecologic process. If this is confirmed to be  low-grade serous carcinoma of gyn origin, this was an R2 resection.    12/15/2020 Initial Diagnosis   Extraovarian primary peritoneal carcinoma (HCC)   12/15/2020 Cancer Staging   Staging form: [place], AJCC 7th Edition - Pathologic stage from 12/15/2020: Stage IIIC (T3c, N0, cM0) - Signed by Artis Delay, MD on 12/15/2020 Staged by: Managing physician Stage prefix: Initial diagnosis    01/04/2021 Procedure   Successful placement of a power injectable Port-A-Cath via the right internal jugular vein. The catheter is ready for immediate use.   01/08/2021 - 02/19/2021 Chemotherapy    Patient is on Treatment Plan: OVARIAN CARBOPLATIN (AUC 6) / PACLITAXEL (175) Q21D X 6 CYCLES       01/29/2021 Tumor Marker   Patient's tumor was tested for the following markers: CA-125. Results of the tumor marker test revealed 59.   02/05/2021 Imaging   Right:  No evidence of superficial vein thrombosis in the upper extremity.  Findings  consistent with acute deep vein thrombosis involving the right internal jugular vein.     Left:  No evidence of thrombosis in the subclavian.       03/10/2021 Tumor Marker   Patient's tumor was tested for the following markers: CA-125. Results of the tumor marker test revealed 81.8.   03/11/2021 Imaging   Stable bulky calcified retroperitoneal lymphadenopathy, consistent with metastatic disease.   Stable right hepatic lobe lesion, likely representing a benign hemangioma. This shoeds no FDG uptake on previous PET-CT scan.   No evidence of new or progressive disease within the abdomen or pelvis.   Aortic Atherosclerosis (ICD10-I70.0).   04/06/2021 Procedure   Successful removal of implanted Port-A-Cath.     Current Therapy: none  Interval history-  [name] [name] is a 65 yo female presenting to [facility] today with concern for wound check of recent port removal site. Patient had port-a-cath removed from right chest 04/06/21. Patient seen in ED 04/04/21 for right neck  pain. 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 Port was removed for that reason. She states since having the port removed she noticed a small knot under the skin. She states it is tender to the touch. She describes it as a soreness. Pain does not radiate. She rates pain 1/10 in severity. She denies any bleeding or oozing from port site. She has not tried any OTC medications PTA. She denies fever, chills, night sweats, neck pain, chest pain, difficulty breathing, rash.     ROS  All other systems are reviewed and are negative for acute change except as noted in the HPI.    Allergies  Allergen Reactions   Ciprofloxacin-Ciproflox Hcl Er Nausea And Vomiting   Amoxicillin Nausea And Vomiting    Pt received ancef on 12-01-20 without issue   Bupropion     Other reaction(s): GI Upset (intolerance)   Codeine Nausea And Vomiting   Duloxetine     Other reaction(s): Other (See Comments) High blood pressure response   Methylphenidate     Other reaction(s): Other (See Comments) BLISTERS   Other     Narcotics=nausea/vomiting (sick) & behavioral changes ("makes me crazy")   Latex Rash     Past Medical History:  Diagnosis Date   Arthritis    Chronic pain    Contact lens/glasses fitting    wears contacts or glasses   Depression    Family history of melanoma 04/29/2021  Fibromyalgia    Melanoma ([place])    Peritoneal carcinoma ([place])    PONV (postoperative nausea and vomiting)    prolonged sedation very hard to wake     Past Surgical History:  Procedure Laterality Date   BACK SURGERY     lumb lam   BREAST BIOPSY Right    2021   BREAST BIOPSY Left    BREAST ENHANCEMENT SURGERY  05/30/1980   implants    BREAST IMPLANT EXCHANGE  05/30/2009   CARPAL TUNNEL RELEASE  05/30/2001   rt and lt   CARPOMETACARPEL SUSPENSION PLASTY  06/22/2012   Procedure: CARPOMETACARPEL ([place]) SUSPENSION PLASTY;  Surgeon: Alta Corning, MD;  Location: [facility];  Service: Orthopedics;  Laterality: Left;  burton's interposition arthroplasty left thumb    CATARACT EXTRACTION W/ INTRAOCULAR LENS  IMPLANT, BILATERAL     COLONOSCOPY     x2   DIAGNOSTIC LAPAROSCOPY  05/30/2000   expl lap   DILATION AND CURETTAGE OF UTERUS  05/30/2001   ablation   ELBOW SURGERY  05/31/1999   decompression rt   EPIDURAL BLOCK INJECTION     multiple   IR IMAGING GUIDED PORT INSERTION  01/04/2021   IR REMOVAL TUN ACCESS W/ PORT W/O FL MOD SED  04/06/2021   LYMPH NODE DISSECTION N/A 12/01/2020   Procedure: LYMPH NODE DISSECTION;  Surgeon: Lafonda Mosses, MD;  Location: [facility];  Service: Gynecology;  Laterality: N/A;   SKIN CANCER EXCISION     melanoma, from abdomen   TONSILLECTOMY      Social History   Socioeconomic History   Marital status: Married    Spouse name: Not on file   Number of children: Not on file   Years of education: Not on file   Highest education level: Not on file  Occupational History   Not on file  Tobacco Use   Smoking status: Never   Smokeless tobacco: Never  Vaping Use   Vaping Use: Never used  Substance and Sexual Activity   Alcohol use: Yes    Comment: rare   Drug use: No   Sexual activity: Yes    Partners: Male    Birth control/protection: Post-menopausal  Other Topics Concern   Not on file  Social History Narrative   Not on file   Social Determinants of Health   Financial Resource Strain: Not on file  Food Insecurity: Not on file  Transportation Needs: Not on file  Physical Activity: Not on file  Stress: Not on file  Social Connections: Not on file  Intimate Partner Violence: Not on file    Family History  Problem Relation Age of Onset   Lung cancer Father    Colon cancer Neg Hx    Breast cancer Neg Hx    Ovarian cancer Neg Hx    Endometrial cancer Neg Hx    Pancreatic cancer Neg Hx    Prostate cancer Neg Hx      Current Outpatient Medications:    alum &  mag hydroxide-simeth (MAALOX/MYLANTA) 200-200-20 MG/5ML suspension, Take 30 mLs by mouth every 4 (four) hours as needed for up to 6 doses for indigestion or heartburn., Disp: 180 mL, Rfl: 0   apixaban (ELIQUIS) 5 MG TABS tablet, Take 5 mg by mouth 2 (two) times daily. (Patient not taking: Reported on 04/12/2021), Disp: , Rfl:    famotidine (PEPCID) 20 MG tablet, Take 1 tablet (20 mg total) by mouth daily. Can take a second dose as needed daily,  Disp: 40 tablet, Rfl: 3   FLUoxetine (PROZAC) 20 MG tablet, Take 10 mg by mouth every evening., Disp: , Rfl:    hyoscyamine (LEVSIN SL) 0.125 MG SL tablet, Place 1 tablet (0.125 mg total) under the tongue every 6 (six) hours as needed., Disp: 60 tablet, Rfl: 1   letrozole (FEMARA) 2.5 MG tablet, Take 1 tablet (2.5 mg total) by mouth daily., Disp: 30 tablet, Rfl: 3   Misc Natural Products (ELDERBERRY IMMUNE COMPLEX PO), Take 5 mLs by mouth daily., Disp: , Rfl:    Probiotic Product (PROBIOTIC PO), Take 1 capsule by mouth in the morning., Disp: , Rfl:    traZODone (DESYREL) 50 MG tablet, Take 1 tablet (50 mg total) by mouth at bedtime., Disp: 30 tablet, Rfl: 3  PHYSICAL EXAM: ECOG FS:1 - Symptomatic but completely ambulatory    Vitals:   04/29/21 1206  BP: (!) 156/89  Pulse: 63  Resp: 16  SpO2: 97%   Physical Exam Vitals and nursing note reviewed.  Constitutional:      Appearance: She is well-developed. She is not ill-appearing or toxic-appearing.  HENT:     Head: Normocephalic and atraumatic.     Nose: Nose normal.  Eyes:     General: No scleral icterus.       Right eye: No discharge.        Left eye: No discharge.     Conjunctiva/sclera: Conjunctivae normal.  Neck:     Vascular: No JVD.  Cardiovascular:     Rate and Rhythm: Normal rate and regular rhythm.     Pulses: Normal pulses.          Radial pulses are 2+ on the right side and 2+ on the left side.     Heart sounds: Normal heart sounds.  Pulmonary:     Effort: Pulmonary effort is  normal.     Breath sounds: Normal breath sounds.  Chest:     Comments: Incision from port site in right upper chest without signs of infection. No wound dehiscence. No overlying erythema. There is approximately a 1 x 1 cm circular area palpated under incision that is indurated. No palpable fluctuance.  Abdominal:     General: There is no distension.  Musculoskeletal:        General: Normal range of motion.     Cervical back: Normal range of motion.     Comments: No swelling noted to RUE. Compartments soft in all extremities.  Lymphadenopathy:     Upper Body:     Left upper body: No supraclavicular, axillary or pectoral adenopathy.  Skin:    General: Skin is warm and dry.  Neurological:     Mental Status: She is oriented to person, place, and time.     GCS: GCS eye subscore is 4. GCS verbal subscore is 5. GCS motor subscore is 6.     Comments: Fluent speech, no facial droop.  Psychiatric:        Behavior: Behavior normal.       LABORATORY DATA: I have reviewed the data as listed CBC Latest Ref Rng & Units 03/10/2021 02/19/2021 01/29/2021  WBC 4.0 - 10.5 K/uL 5.5 12.2(H) 9.1  Hemoglobin 12.0 - 15.0 g/dL 11.5(L) 13.1 12.7  Hematocrit 36.0 - 46.0 % 34.0(L) 36.8 38.0  Platelets 150 - 400 K/uL 217 262 271     CMP Latest Ref Rng & Units 03/10/2021 02/19/2021 01/29/2021  Glucose 70 - 99 mg/dL 107(H) 159(H) 175(H)  BUN 8 - 23 mg/dL 13  21 22  Creatinine 0.44 - 1.00 mg/dL 0.68 0.79 0.81  Sodium 135 - 145 mmol/L 142 141 142  Potassium 3.5 - 5.1 mmol/L 4.0 4.0 3.7  Chloride 98 - 111 mmol/L 106 105 105  CO2 22 - 32 mmol/L $RemoveB'27 24 24  'BoKYbJju$ Calcium 8.9 - 10.3 mg/dL 9.7 10.2 9.8  Total Protein 6.5 - 8.1 g/dL 7.0 7.5 7.3  Total Bilirubin 0.3 - 1.2 mg/dL 0.6 0.4 0.4  Alkaline Phos 38 - 126 U/L 59 73 73  AST 15 - 41 U/L 16 14(L) 12(L)  ALT 0 - 44 U/L $Remo'14 14 12       'QOwEw$ RADIOGRAPHIC STUDIES: I have personally reviewed the radiological images as listed and agreed with the findings in the report. No  images are attached to the encounter. IR Removal Tun Access W/ Port W/O [place]  Result Date: 04/06/2021 CLINICAL DATA:  65 year old female with history of peritoneal carcinoma status post port placement on 01/04/2021. The patient presents for port removal due to no further plans for chemotherapy administration. EXAM: REMOVAL OF IMPLANTED TUNNELED PORT-A-CATH MEDICATIONS: None. ANESTHESIA/SEDATION: None. FLUOROSCOPY TIME:  None PROCEDURE: Informed written consent was obtained from the patient after a discussion of the risk, benefits and alternatives to the procedure. The patient was positioned supine on the fluoroscopy table and the right chest Port-A-Cath site was prepped with chlorhexidine. A sterile gown and gloves were worn during the procedure. Local anesthesia was provided with 1% lidocaine with epinephrine. A timeout was performed prior to the initiation of the procedure. An incision was made overlying the Port-A-Cath with a #15 scalpel. Utilizing sharp and blunt dissection, the Port-A-Cath was removed completely. The pocked was irrigated with sterile saline. Wound closure was performed with interrupted deep dermal 3-0 Vicryl, knotless running subcuticular 4-0 monocryl, and Dermabond. A dressing was placed. The patient tolerated the procedure well without immediate post procedural complication. FINDINGS: Successful removal of implant Port-A-Cath without immediate post procedural complication. IMPRESSION: Successful removal of implanted Port-A-Cath. Ruthann Cancer, MD [facility] Electronically Signed   By: Ruthann Cancer M.D.   On: 04/06/2021 14:59   DG BONE DENSITY (DXA)  Result Date: 04/01/2021 EXAM: DUAL X-RAY ABSORPTIOMETRY (DXA) FOR BONE MINERAL DENSITY IMPRESSION: Patient: [name] [name] Referring Physician: Lafonda Mosses Birth Date: 14-Oct-1955 [age] Patient ID: [id number] Height: 63.0 in. Weight: 136.4 lbs. Measured:  04/01/2021 2:56:30 PM (18 SP 3) Sex: Female Ethnicity: White Analyzed: 04/01/2021 3:13:08 PM (18 SP 3) FRAX* Based on femoral neck BMD: DualFemur (Left) 10-year Probability of Fracture Major Osteoporotic Fracture: 17.2 % Hip Fracture:                1.4 % Population:                  [place] (Caucasian) Risk Factors:                Family Hist. (Parent hip fracture) *FRAX is a [place] [employer] for Metabolic Bone Diseases, a [place] (WHO) [facility] [phone]). Referring Physician:  Lafonda Mosses Your patient completed a bone mineral density test using GE Lunar iDXA system (analysis version: 16). Technologist: CAN/ALW PATIENT: Name: [name] [name], [name] [name] Patient ID: [id number] Birth Date: 08/10/1955 Height: 63.0 in. Sex: Female Measured: 04/01/2021 Weight: 136.4 lbs. Indications: Caucasian, Estrogen Deficiency, Family Hist. (Parent hip fracture), Post Menopausal Fractures: Treatments: ASSESSMENT: The BMD measured at DualFemur Neck  Left is 0.813 g/cm2 with a T-score of -1.6. This patient is considered to have osteopenia/low bone mass according to [facility]) criteria. The scan qualiy was good. AP Spine was excluded due to degenerative changes. Site Region Measured Date Measured Age YA BMD Significant CHANGE T-score DualFemur Neck Left 04/01/2021 65.0 -1.6 0.813 g/cm2 DualFemur Total Mean 04/01/2021 66.0 -0.9 0.901 g/cm2 Left Forearm Radius 33% 04/01/2021 66.0 -1.0 0.786 g/cm2 World Health Organization [facility]) criteria for post-menopausal, Caucasian Women: Normal       T-score at or above -1 SD Osteopenia   T-score between -1 and -2.5 SD Osteoporosis T-score at or below -2.5 SD RECOMMENDATION: 1. All patients should optimize calcium and vitamin D intake. 2. Consider FDA approved medical therapies in postmenopausal women and men aged 48 years and older, based on the following: a. A hip or vertebral (clinical or morphometric)  fracture b. T-score = -2.5 at the femoral neck or spine after appropriate evaluation to exclude secondary causes c. Low bone mass (T-score between -1.0 and -2.5 at the femoral neck or spine) and a 10- year probability of a hip fracture = 3% or a 10 year probability of a major osteoporosis-related fracture = 20% based on the [place]-adapted WHO algorithm. 3. Clinician judgement and/or patient preference may indicate treatment for people with10-year fracture probabilities above or below these levels. FOLLOW-UP: Patients with diagnosis of osteoporosis or at high risk for fracture should have regular bone mineral density tests . For patients eligible for Medicare routine testing is allowed once every 2 years. The testing frequency can be increased to one year for patients who have rapidly progressing disease, those who are receiving or discontinuing medical therapy to restore bone mass, or have additional risk factors. I have reviewed this study and agree with the findings. Mark A. Thornton Papas, M.D. [facility] Electronically Signed   By: Lavonia Dana M.D.   On: 04/01/2021 17:14   VAS [place] UPPER EXTREMITY VENOUS DUPLEX  Result Date: 04/04/2021 UPPER VENOUS STUDY  Patient Name:  [name] [name]  Date of Exam:   04/04/2021 Medical Rec #: [id number]              Accession #:    [id number] Date of Birth: 29-Mar-1956              Patient Gender: F Patient Age:   11 years Exam Location:  [facility] Procedure:      VAS [place] UPPER EXTREMITY VENOUS DUPLEX Referring Phys: [facility] SPONSELLER --------------------------------------------------------------------------------  Indications: right sided neck pain Risk Factors: Cancer patient with right side port placement. Anticoagulation: Eliquis. Comparison Study: previous exam on 02/05/2021 was positive for DVT in RT IJV Performing Technologist: Rogelia Rohrer RVT, RDMS  Examination Guidelines: A complete evaluation includes B-mode imaging, spectral Doppler, color Doppler, and  power Doppler as needed of all accessible portions of each vessel. Bilateral testing is considered an integral part of a complete examination. Limited examinations for reoccurring indications may be performed as noted.  Right Findings: +----------+------------+---------+-----------+----------+-------+ RIGHT     CompressiblePhasicitySpontaneousPropertiesSummary +----------+------------+---------+-----------+----------+-------+ IJV         Partial      Yes       Yes              Chronic +----------+------------+---------+-----------+----------+-------+ Subclavian    Full       Yes       Yes                      +----------+------------+---------+-----------+----------+-------+  Axillary      Full       Yes       Yes                      +----------+------------+---------+-----------+----------+-------+ Brachial      Full                                          +----------+------------+---------+-----------+----------+-------+ Radial        Full                                          +----------+------------+---------+-----------+----------+-------+ Ulnar         Full                                          +----------+------------+---------+-----------+----------+-------+ Cephalic      Full                                          +----------+------------+---------+-----------+----------+-------+ Basilic       Full       Yes       Yes                      +----------+------------+---------+-----------+----------+-------+  Left Findings: +----------+------------+---------+-----------+----------+-------+ LEFT      CompressiblePhasicitySpontaneousPropertiesSummary +----------+------------+---------+-----------+----------+-------+ Subclavian    Full       Yes       Yes                      +----------+------------+---------+-----------+----------+-------+  Summary:  Right: No evidence of superficial vein thrombosis in the upper extremity.  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  Left: No evidence of thrombosis in the subclavian.  *See table(s) above for measurements and observations.  Diagnosing physician: Monica Martinez MD Electronically signed by Monica Martinez MD on 04/04/2021 at 5:12:00 PM.    Final      ASSESSMENT & PLAN: Patient is a 65 y.o. 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 Maurilio Lovely.  #) Wound check- patient afebrile, non toxic appearing. She has what feels like scar tissue under incision site. There are no signs of infection. No RUE swelling or pain. Exam not suggestive of abscess. Discussed with oncologist Dr. Alvy Bimler who agrees with plan for symptomatic treatment including warm compresses. Area can take up to 3 months to heal after port removal. Discussed strict ED precautions. Patient agreeable with plan.   Visit Diagnosis: 1. Visit for wound check      No orders of the defined types were placed in this encounter.   All questions were answered. The patient knows to call the clinic with any problems, questions or concerns. No barriers to learning was detected.  I have spent a total of 10 minutes minutes of face-to-face and non-face-to-face time, preparing to see the patient, obtaining and/or reviewing separately obtained history, performing a medically appropriate examination, counseling and educating the patient, ordering tests,  documenting clinical  information in the electronic health record, and care coordination.     Thank you for allowing me to participate in the care of this patient.    Barrie Folk, PA-C Department of Hematology/Oncology [facility] at [facility] Phone: [phone]  [phone]    04/29/2021 2:29 PM

## 2021-04-30 ENCOUNTER — Telehealth: Payer: Self-pay | Admitting: Oncology

## 2021-04-30 LAB — CA 125: Cancer Antigen (CA) 125: 29.4 U/mL (ref 0.0–38.1)

## 2021-04-30 NOTE — Telephone Encounter (Signed)
Called [name] [name] and advised her of good CA 125 results. She verbalized understanding and agreement.

## 2021-05-03 ENCOUNTER — Encounter: Payer: Self-pay | Admitting: Genetic Counselor

## 2021-05-03 NOTE — Progress Notes (Signed)
REFERRING PROVIDER: Artis Delay, MD [address] [place],  [place] [address]  PRIMARY PROVIDER:  Olive Bass, MD  PRIMARY REASON FOR VISIT:  1. Extraovarian primary peritoneal carcinoma (HCC)   2. Family history of melanoma     HISTORY OF PRESENT ILLNESS:   [name] [name], a 65 y.o. female, was seen for a [facility] cancer genetics consultation at the request of Dr. Bertis Ruddy due to a personal history of primary peritoneal cancer.  [name] [name] presents to clinic today to discuss the possibility of a hereditary predisposition to cancer, to discuss genetic testing, and to further clarify her future cancer risks, as well as potential cancer risks for family members.   In July 2022, at the age of 89, [name] [name] was diagnosed with extraovarian primary peritoneal cancer.  She also has a history of melanoma, which required surgery only, on her chest and abdomen diagnosed approximately 10-20 years ago.    CANCER HISTORY:  Oncology History Overview Note  Low grade serous, ER positive [facility] done on 8/9: HRD not detected, MSI stable, low tumor mutational burden 1 Muts/Mb, no alterations of BRCA1 & 2   Extraovarian primary peritoneal carcinoma (HCC)  09/29/2020 Imaging   MRI lumbar spine The dominant finding in this case is that of massive retroperitoneal lymphadenopathy, likely related to either metastatic disease or lymphoma.   Chronic degenerative and postoperative findings of the spine as outlined in detail above   10/05/2020 Imaging   IMPRESSION outside CT 1. Large calcified retroperitoneal lymph nodes highly concerning for metastatic disease, likely related to primary carcinoma of the colon or lymphoma. Clinical correlation is recommended. 2. No bowel obstruction. Normal appendix. 3. Stable right hepatic dome hemangioma.   10/30/2020 PET scan   1. The densely calcified retroperitoneal adenopathy is highly hypermetabolic with maximum SUV of 23.4 (Deauville 5) there  is also Deauville 3 activity in a small right axillary lymph node. Given the lack of an obvious primary outside of the retroperitoneum, appearance tends to favor lymphoma or Castleman's disease. Tissue diagnosis suggested. 2. Other imaging findings of potential clinical significance: Chronic left maxillary sinusitis. Aortic Atherosclerosis (ICD10-I70.0). Coronary atherosclerosis.   11/10/2020 Procedure   CT guided biopsy of retroperitoneal adenopathy   11/10/2020 Pathology Results   biopsy results which favor a low-grade serous carcinoma of gyn origin. This is a somewhat atypical presentation, but my suspicion is that it is primary peritoneal carcinoma   11/16/2020 Tumor Marker   Patient's tumor was tested for the following markers: CA-125. Results of the tumor marker test revealed 96.4.   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 UTERUS, CERVIX, BILATERAL FALLOPIAN TUBES AND OVARIES:  Uterus:  -  Inactive endometrium  -  Leiomyoma (1.1 cm; largest)  -  Calcified nodule of the broad ligament with psammoma bodies (right)  -  No hyperplasia or malignancy identified   Cervix:  -  Benign cervix  -  No dysplasia or malignancy identified   Ovaries, bilateral:  -  Serous cyst adenofibroma with microscopic focus of borderline change  (right)    Fallopian tubes, bilateral:  -  Benign fallopian tube with paratubal cyst (right)  -  No malignancy identified   B. OMENTUM, OMENTECTOMY:  -  No carcinoma identified   C. PERIAORTIC TUMOR, EXCISION:  -  Serous carcinoma, low-grade, with psammoma bodies  -  See comment below   COMMENT:   C.  At the request of the treating clinician, immunohistochemistry was repeated.  The neoplastic cells are positive for ER, cytokeratin 7, cytokeratin 5/6,  p53, PAX8 and WT1 but negative for cytokeratin 20,  TTF-1, PR and thyroglobulin.  The morphology and immunophenotype are consistent with a primary peritoneal low-grade serous carcinoma (see also, [id number]).   ONCOLOGY TABLE:   OVARY or FALLOPIAN TUBE or PRIMARY PERITONEUM: Resection   Procedure: Total hysterectomy and bilateral salpingo-oophorectomy with omentectomy and periaortic tumor excision  Specimen Integrity: Intact  Tumor Site: Primary peritoneum (see comment)  Tumor Size: See comment  Histologic Type: Low-grade serous carcinoma  Histologic Grade: Low-grade  Ovarian Surface Involvement: Not identified  Fallopian Tube Surface Involvement: Not identified  Implants (required for advanced stage serous/seromucinous borderline  tumors only): Not applicable  Other Tissue/ Organ Involvement: Periaortic tumor  Largest Extrapelvic Peritoneal Focus: 5.5 cm (aggregate measurement)  Peritoneal/Ascitic Fluid Involvement: Not applicable  Chemotherapy Response Score (CRS): Not applicable, no known presurgical therapy  Regional Lymph Nodes: Not applicable (no lymph nodes submitted or found)  Distant Metastasis:       Distant Site(s) Involved: Not applicable  Pathologic Stage Classification (pTNM, AJCC 8th Edition): pT3, pN not assigned  Ancillary Studies: Can be performed upon request  Representative Tumor Block: C1  Comment(s): The only focus of low-grade serous carcinoma identified in the resection specimen is the tissue submitted as periaortic tumor (part C).  Clinically, this was favored to represent matted lymph nodes; however, there is no evidence of nodal tissue in the examined material. This case was discussed with Dr. Berline Lopes and given the extent of the matted lymph nodes, this is being staged as a pT3.     12/01/2020 Surgery   Pre-operative Diagnosis: low grade serous carcinoma with significant retroperitoneal adenopathy, no other obvious imaging evidence of disease   Post-operative  Diagnosis: same, significantly adherent presacral and para-aortic lymphadenopathy   Operation: Robotic-assisted laparoscopic total hysterectomy with bilateral salpingo-oophorectomy, laparotomy, infra-colic omentectomy, portion of necrotic tumor-ridden retroperitoneal lymph nodes, oversew of sigmoid serosa   Surgeon: Jeral Pinch MD   Assistant Surgeon: Lahoma Crocker MD (an MD assistant was necessary for tissue manipulation, management of robotic instrumentation, retraction and positioning due to the complexity of the case and hospital policies).    Intra-operative consultant:  Everitt Amber MD    Operative Findings: On EUA, small mobile uterus. ON intra-abdominal entry, normal liver and diaphragm. One filmy adhesion noted between the right liver lobe and the anterior abdominal wall. The spleen was somewhat lateral and appeared mildly enlarged. There were some adhesions of the infragastric omentum to the anterior abdominal wall in the midline. The omentum was normal appearing without obvious evidence of tumor. Small and large bowel were normal appearing, appendix normal. Uterus 6cm and normal in appearance. Somewhat atrophic bilateral adnexa. There were filmy adhesions between each adnexa and the pelvic sidewall. Some filmy adhesions were also noted between the posterior uterus and rectum. Approximately 0.5-1cm white nodule within the broad ligament, lateral to the right cornua, c/w possible fibroma vs fibroid. No pelvic adenopathy. Significantly enlarged, necrotic, and tumor-filled lymph node conglomeration was noted starting within the presacral space (adherent to the sacral promontory) and extending superiorly along the great vessels to the level of the renal vessel (suspected). Given adherence of the involved lymph nodes, I could not visualize this anatomy, I could long palpate along each lateral aspect of  the aorta. There was no identifiable plane between matted lymph nodes and underlying aorta  or IVC. Given burden of disease and my concern that resection was not feasible, I asked my senior partner (Dr. Denman George) to evaluate intra-op. She agreed that moving forward with attempt at debulking involved lymph nodes was likely not feasible and an attempt at this time would be unsafe.   There was no obvious peritoneal disease and no clear involvement of gyn organs. Despite prior biopsy, findings at surgery remain concerning for a non-gynecologic process. If this is confirmed to be low-grade serous carcinoma of gyn origin, this was an R2 resection.    12/15/2020 Initial Diagnosis   Extraovarian primary peritoneal carcinoma ([place])   12/15/2020 Cancer Staging   Staging form: Ovary, AJCC 7th Edition - Pathologic stage from 12/15/2020: Stage IIIC (T3c, N0, cM0) - Signed by Heath Lark, MD on 12/15/2020 Staged by: Managing physician Stage prefix: Initial diagnosis    01/04/2021 Procedure   Successful placement of a power injectable Port-A-Cath via the right internal jugular vein. The catheter is ready for immediate use.   01/08/2021 - 02/19/2021 Chemotherapy    Patient is on Treatment Plan: OVARIAN CARBOPLATIN (AUC 6) / PACLITAXEL (175) Q21D X 6 CYCLES       01/29/2021 Tumor Marker   Patient's tumor was tested for the following markers: CA-125. Results of the tumor marker test revealed 59.   02/05/2021 Imaging   Right:  No evidence of superficial vein thrombosis in the upper extremity.  Findings  consistent with acute deep vein thrombosis involving the right internal jugular vein.     Left:  No evidence of thrombosis in the subclavian.       03/10/2021 Tumor Marker   Patient's tumor was tested for the following markers: CA-125. Results of the tumor marker test revealed 81.8.   03/11/2021 Imaging   Stable bulky calcified retroperitoneal lymphadenopathy, consistent with metastatic disease.   Stable right hepatic lobe lesion, likely representing a benign hemangioma. This shoeds no FDG uptake  on previous PET-CT scan.   No evidence of new or progressive disease within the abdomen or pelvis.   Aortic Atherosclerosis (ICD10-I70.0).   04/06/2021 Procedure   Successful removal of implanted Port-A-Cath.   04/29/2021 Tumor Marker   Patient's tumor was tested for the following markers: CA-125. Results of the tumor marker test revealed 29.4.      RISK FACTORS:  Menarche was at age 35.  First live birth at age 44.  OCP use for approximately  25  years.  Ovaries intact: no.  Hysterectomy: yes.  Menopausal status: postmenopausal.  HRT use: 0 years. Colonoscopy: yes; within past 10 years; normal per patient   Past Medical History:  Diagnosis Date   Arthritis    Chronic pain    Contact lens/glasses fitting    wears contacts or glasses   Depression    Family history of melanoma 04/29/2021   Fibromyalgia    Melanoma ([place])    Peritoneal carcinoma ([place])    PONV (postoperative nausea and vomiting)    prolonged sedation very hard to wake    Past Surgical History:  Procedure Laterality Date   BACK SURGERY     lumb lam   BREAST BIOPSY Right    2021   BREAST BIOPSY Left    BREAST ENHANCEMENT SURGERY  05/30/1980   implants    BREAST IMPLANT EXCHANGE  05/30/2009   CARPAL TUNNEL RELEASE  05/30/2001   rt and lt   CARPOMETACARPEL SUSPENSION PLASTY  06/22/2012   Procedure: CARPOMETACARPEL ([place]) SUSPENSION PLASTY;  Surgeon: Alta Corning, MD;  Location: [place] [facility];  Service: Orthopedics;  Laterality: Left;  burton's interposition arthroplasty left thumb    CATARACT EXTRACTION W/ INTRAOCULAR LENS  IMPLANT, BILATERAL     COLONOSCOPY     x2   DIAGNOSTIC LAPAROSCOPY  05/30/2000   expl lap   DILATION AND CURETTAGE OF UTERUS  05/30/2001   ablation   ELBOW SURGERY  05/31/1999   decompression rt   EPIDURAL BLOCK INJECTION     multiple   IR IMAGING GUIDED PORT INSERTION  01/04/2021   IR REMOVAL TUN ACCESS W/ PORT W/O FL MOD SED  04/06/2021   LYMPH NODE DISSECTION  N/A 12/01/2020   Procedure: LYMPH NODE DISSECTION;  Surgeon: Lafonda Mosses, MD;  Location: [facility];  Service: Gynecology;  Laterality: N/A;   SKIN CANCER EXCISION     melanoma, from abdomen   TONSILLECTOMY      Social History   Socioeconomic History   Marital status: Married    Spouse name: Not on file   Number of children: Not on file   Years of education: Not on file   Highest education level: Not on file  Occupational History   Not on file  Tobacco Use   Smoking status: Never   Smokeless tobacco: Never  Vaping Use   Vaping Use: Never used  Substance and Sexual Activity   Alcohol use: Yes    Comment: rare   Drug use: No   Sexual activity: Yes    Partners: Male    Birth control/protection: Post-menopausal  Other Topics Concern   Not on file  Social History Narrative   Not on file   Social Determinants of Health   Financial Resource Strain: Not on file  Food Insecurity: Not on file  Transportation Needs: Not on file  Physical Activity: Not on file  Stress: Not on file  Social Connections: Not on file     FAMILY HISTORY:  We obtained a detailed, 4-generation family history.  Significant diagnoses are listed below: Family History  Problem Relation Age of Onset   Melanoma Mother        behind ear; on leg; dx after 35   Basal cell carcinoma Mother        face   Lung cancer Father 79       smoking hx   Leukemia Maternal Grandmother        dx after 10   Lung cancer Cousin        maternal female cousin; dx after 58; smoking hx   Cancer Cousin        paternal female cousin; unknown type; dx after 100   Colon cancer Neg Hx    Breast cancer Neg Hx    Ovarian cancer Neg Hx    Endometrial cancer Neg Hx    Pancreatic cancer Neg Hx    Prostate cancer Neg Hx     [name] [name] is unaware of previous family history of genetic testing for hereditary cancer risks. There is no reported Ashkenazi Jewish ancestry. There is no known consanguinity.  GENETIC COUNSELING  ASSESSMENT: [name] [name] is a 65 y.o. female with a personal history of cancer which is somewhat suggestive of a hereditary cancer syndrome and predisposition to cancer given her diagnosis of primary peritoneal cancer. We, therefore, discussed and recommended the following at today's visit.   DISCUSSION: We discussed that 5 - 10% of cancer is hereditary, with  most cases of primary peritoneal cancer associated with mutations in BRCA1/2.  There are other genes that can be associated with hereditary primary peritoneal cancer syndromes. We discussed that testing is beneficial for several reasons including knowing how to follow individuals for their cancer risks, identifying whether potential treatment options, such as PARP inhibitors, would be beneficial, and understanding if other family members could be at risk for cancer and allowing them to undergo genetic testing.   We reviewed the characteristics, features and inheritance patterns of hereditary cancer syndromes. We also discussed genetic testing, including the appropriate family members to test, the process of testing, insurance coverage and turn-around-time for results. We discussed the implications of a negative, positive, carrier and/or variant of uncertain significant result. We recommended [name] [name] pursue genetic testing for a panel that includes genes associated with primary peritoneal cancer and melanoma.   The Multi-Cancer + RNA Panel offered by [facility] includes sequencing and/or deletion/duplication analysis of the following 84 genes:  AIP*, ALK, APC*, ATM*, AXIN2*, BAP1*, BARD1*, BLM*, BMPR1A*, BRCA1*, BRCA2*, BRIP1*, CASR, CDC73*, CDH1*, CDK4, CDKN1B*, CDKN1C*, CDKN2A, CEBPA, CHEK2*, CTNNA1*, DICER1*, DIS3L2*, EGFR, EPCAM, FH*, FLCN*, GATA2*, GPC3, GREM1, HOXB13, HRAS, KIT, MAX*, MEN1*, MET, MITF, MLH1*, MSH2*, MSH3*, MSH6*, MUTYH*, NBN*, NF1*, NF2*, NTHL1*, PALB2*, PDGFRA, PHOX2B, PMS2*, POLD1*, POLE*, POT1*, PRKAR1A*, PTCH1*, PTEN*,  RAD50*, RAD51C*, RAD51D*, RB1*, RECQL4, RET, RUNX1*, SDHA*, SDHAF2*, SDHB*, SDHC*, SDHD*, SMAD4*, SMARCA4*, SMARCB1*, SMARCE1*, STK11*, SUFU*, TERC, TERT, TMEM127*, Tp53*, TSC1*, TSC2*, VHL*, WRN*, and WT1.  RNA analysis is performed for * genes.  Based on [name] [name]'s personal history of cancer, she meets medical criteria for genetic testing. Despite that she meets criteria, she may still have an out of pocket cost. We discussed that if she has an out of pocket cost for testing, the laboratory should reach out to her to discuss self-pay options and/or patient pay assistance programs.   PLAN: After considering the risks, benefits, and limitations, [name] [name] provided informed consent to pursue genetic testing and the blood sample was sent to [facility] for analysis of the Multi-Cancer +RNA Panel. Results should be available within approximately 3 weeks' time, at which point they will be disclosed by telephone to [name] [name], as will any additional recommendations warranted by these results. [name] [name] will receive a summary of her genetic counseling visit and a copy of her results once available. This information will also be available in Epic.   Lastly, we encouraged [name] [name] to remain in contact with cancer genetics annually so that we can continuously update the family history and inform her of any changes in cancer genetics and testing that may be of benefit for this family.   [name] [name]'s questions were answered to her satisfaction today. Our contact information was provided should additional questions or concerns arise. Thank you for the referral and allowing [place] to share in the care of your patient.   [name] [name], [place], [facility] Genetic Counselor [email or web address] (P) [phone]  The patient was seen for a total of 40 minutes in face-to-face genetic counseling.  The patient was seen alone.  Drs. Magrinat, Lindi Adie and/or Burr Medico were available to discuss this  case as needed.   _______________________________________________________________________ For Office Staff:  Number of people involved in session: 1 Was an Intern/ student involved with case: no

## 2021-05-12 ENCOUNTER — Telehealth: Payer: Self-pay

## 2021-05-12 NOTE — Telephone Encounter (Signed)
Spoke with [name] [name] this afternoon. Advised patient that Dr. Berline Lopes would like her to get 3 months of therapy in, especially since CA 125 responded.   Per Dr. Berline Lopes, we can try to treat symptoms on letrozole, try a different aromatase inhibitor (AI) or she can switch to another medication such as tamoxifen.  Patient states she is unable to take any pain medication other than tylenol. Pain medicine makes her sick and upsets her stomach.  Patient inquiring the risk and benefits of AI vs tamoxifen and how it relates to bone density. She would like to speak with Dr. Berline Lopes more in depth about this.   Phone visit scheduled with Dr. Berline Lopes on 05/18/21 at 4:00 pm. Patient is in agreement of date and time of appointment.

## 2021-05-12 NOTE — Telephone Encounter (Signed)
Returning call to [name] [name]. Patient reports she is having problems with hormone therapy. She has been on letrozole for almost a month now. A week after she started taking it she developed generalized pain all over her body. The pain has gotten consistently worse. She reports having fibromyalgia but states this is different. She hurts in her feet, toes, hands, neck and back. She rates her pain as 5-6/10. She states tylenol helps some but the pain does not go away.  Advised patient that MD will be notified and someone from our office will her contact her. Patient verbalized understanding.

## 2021-05-12 NOTE — Telephone Encounter (Signed)
Following up with [name]. Unable to contact patient. Left message requesting return call.

## 2021-05-18 ENCOUNTER — Encounter: Payer: Self-pay | Admitting: Gynecologic Oncology

## 2021-05-18 ENCOUNTER — Inpatient Hospital Stay (HOSPITAL_BASED_OUTPATIENT_CLINIC_OR_DEPARTMENT_OTHER): Payer: Medicare Other | Admitting: Gynecologic Oncology

## 2021-05-18 DIAGNOSIS — M898X9 Other specified disorders of bone, unspecified site: Secondary | ICD-10-CM

## 2021-05-18 DIAGNOSIS — Z79810 Long term (current) use of selective estrogen receptor modulators (SERMs): Secondary | ICD-10-CM

## 2021-05-18 DIAGNOSIS — C569 Malignant neoplasm of unspecified ovary: Secondary | ICD-10-CM | POA: Diagnosis not present

## 2021-05-18 NOTE — Progress Notes (Signed)
Gynecologic Oncology Telehealth Consult Note: Gyn-Onc  I connected with [name] [name] on 05/18/21 at  4:00 PM EST by telephone and verified that I am speaking with the correct person using two identifiers.  I discussed the limitations, risks, security and privacy concerns of performing an evaluation and management service by telemedicine and the availability of in-person appointments. I also discussed with the patient that there may be a patient responsible charge related to this service. The patient expressed understanding and agreed to proceed.  Other persons participating in the visit and their role in the encounter: None.  Patient's location: Home Provider's location: [facility]  Reason for Visit: Follow-up symptoms related to aromatase inhibitor  Treatment History: Oncology History Overview Note  Low grade serous, ER positive [facility] One done on 8/9: HRD not detected, MSI stable, low tumor mutational burden 1 Muts/Mb, no alterations of BRCA1 & 2   Extraovarian primary peritoneal carcinoma ([place])  09/29/2020 Imaging   MRI lumbar spine The dominant finding in this case is that of massive retroperitoneal lymphadenopathy, likely related to either metastatic disease or lymphoma.   Chronic degenerative and postoperative findings of the spine as outlined in detail above   10/05/2020 Imaging   IMPRESSION outside CT 1. Large calcified retroperitoneal lymph nodes highly concerning for metastatic disease, likely related to primary carcinoma of the colon or lymphoma. Clinical correlation is recommended. 2. No bowel obstruction. Normal appendix. 3. Stable right hepatic dome hemangioma.   10/30/2020 PET scan   1. The densely calcified retroperitoneal adenopathy is highly hypermetabolic with maximum SUV of 23.4 (Deauville 5) there is also Deauville 3 activity in a small right axillary lymph node. Given the lack of an obvious primary outside of the retroperitoneum, appearance  tends to favor lymphoma or Castleman's disease. Tissue diagnosis suggested. 2. Other imaging findings of potential clinical significance: Chronic left maxillary sinusitis. Aortic Atherosclerosis (ICD10-I70.0). Coronary atherosclerosis.   11/10/2020 Procedure   CT guided biopsy of retroperitoneal adenopathy   11/10/2020 Pathology Results   biopsy results which favor a low-grade serous carcinoma of gyn origin. This is a somewhat atypical presentation, but my suspicion is that it is primary peritoneal carcinoma   11/16/2020 Tumor Marker   Patient's tumor was tested for the following markers: CA-125. Results of the tumor marker test revealed 96.4.   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 UTERUS, CERVIX, BILATERAL FALLOPIAN TUBES AND OVARIES:  Uterus:  -  Inactive endometrium  -  Leiomyoma (1.1 cm; largest)  -  Calcified nodule of the broad ligament with psammoma bodies (right)  -  No hyperplasia or malignancy identified   Cervix:  -  Benign cervix  -  No dysplasia or malignancy identified   Ovaries, bilateral:  -  Serous cyst adenofibroma with microscopic focus of borderline change  (right)    Fallopian tubes, bilateral:  -  Benign fallopian tube with paratubal cyst (right)  -  No malignancy identified   B. OMENTUM, OMENTECTOMY:  -  No carcinoma identified   C. PERIAORTIC TUMOR, EXCISION:  -  Serous carcinoma, low-grade, with psammoma bodies  -  See comment below   COMMENT:   C.  At the request of the treating clinician, immunohistochemistry was repeated.  The neoplastic cells are positive for ER, cytokeratin 7, cytokeratin 5/6, p53,  PAX8 and WT1 but negative for cytokeratin 20,  TTF-1, PR and thyroglobulin.  The morphology and immunophenotype are consistent with a primary  peritoneal low-grade serous carcinoma (see also, [id number]).   ONCOLOGY TABLE:   OVARY or FALLOPIAN TUBE or PRIMARY PERITONEUM: Resection   Procedure: Total hysterectomy and bilateral salpingo-oophorectomy with omentectomy and periaortic tumor excision  Specimen Integrity: Intact  Tumor Site: Primary peritoneum (see comment)  Tumor Size: See comment  Histologic Type: Low-grade serous carcinoma  Histologic Grade: Low-grade  Ovarian Surface Involvement: Not identified  Fallopian Tube Surface Involvement: Not identified  Implants (required for advanced stage serous/seromucinous borderline  tumors only): Not applicable  Other Tissue/ Organ Involvement: Periaortic tumor  Largest Extrapelvic Peritoneal Focus: 5.5 cm (aggregate measurement)  Peritoneal/Ascitic Fluid Involvement: Not applicable  Chemotherapy Response Score (CRS): Not applicable, no known presurgical therapy  Regional Lymph Nodes: Not applicable (no lymph nodes submitted or found)  Distant Metastasis:       Distant Site(s) Involved: Not applicable  Pathologic Stage Classification (pTNM, AJCC 8th Edition): pT3, pN not assigned  Ancillary Studies: Can be performed upon request  Representative Tumor Block: C1  Comment(s): The only focus of low-grade serous carcinoma identified in the resection specimen is the tissue submitted as periaortic tumor (part C).  Clinically, this was favored to represent matted lymph nodes; however, there is no evidence of nodal tissue in the examined material. This case was discussed with Dr. Berline Lopes and given the extent of the matted lymph nodes, this is being staged as a pT3.     12/01/2020 Surgery   Pre-operative Diagnosis: low grade serous carcinoma with significant retroperitoneal adenopathy, no other obvious imaging evidence of disease   Post-operative Diagnosis: same, significantly adherent presacral and para-aortic lymphadenopathy   Operation: Robotic-assisted laparoscopic total hysterectomy  with bilateral salpingo-oophorectomy, laparotomy, infra-colic omentectomy, portion of necrotic tumor-ridden retroperitoneal lymph nodes, oversew of sigmoid serosa   Surgeon: Jeral Pinch MD   Assistant Surgeon: Lahoma Crocker MD (an MD assistant was necessary for tissue manipulation, management of robotic instrumentation, retraction and positioning due to the complexity of the case and hospital policies).    Intra-operative consultant:  Everitt Amber MD    Operative Findings: On EUA, small mobile uterus. ON intra-abdominal entry, normal liver and diaphragm. One filmy adhesion noted between the right liver lobe and the anterior abdominal wall. The spleen was somewhat lateral and appeared mildly enlarged. There were some adhesions of the infragastric omentum to the anterior abdominal wall in the midline. The omentum was normal appearing without obvious evidence of tumor. Small and large bowel were normal appearing, appendix normal. Uterus 6cm and normal in appearance. Somewhat atrophic bilateral adnexa. There were filmy adhesions between each adnexa and the pelvic sidewall. Some filmy adhesions were also noted between the posterior uterus and rectum. Approximately 0.5-1cm white nodule within the broad ligament, lateral to the right cornua, c/w possible fibroma vs fibroid. No pelvic adenopathy. Significantly enlarged, necrotic, and tumor-filled lymph node conglomeration was noted starting within the presacral space (adherent to the sacral promontory) and extending superiorly along the great vessels to the level of the renal vessel (suspected). Given adherence of the involved lymph nodes, I could not visualize this anatomy, I could long palpate along each lateral aspect of the aorta. There was no identifiable plane between matted lymph nodes and underlying aorta or IVC. Given burden of disease and my concern that resection was not feasible, I asked my senior partner (Dr. Denman George) to evaluate intra-op. She  agreed that  moving forward with attempt at debulking involved lymph nodes was likely not feasible and an attempt at this time would be unsafe.   There was no obvious peritoneal disease and no clear involvement of gyn organs. Despite prior biopsy, findings at surgery remain concerning for a non-gynecologic process. If this is confirmed to be low-grade serous carcinoma of gyn origin, this was an R2 resection.    12/15/2020 Initial Diagnosis   Extraovarian primary peritoneal carcinoma (HCC)   12/15/2020 Cancer Staging   Staging form: [place], AJCC 7th Edition - Pathologic stage from 12/15/2020: Stage IIIC (T3c, N0, cM0) - Signed by Artis Delay, MD on 12/15/2020 Staged by: Managing physician Stage prefix: Initial diagnosis    01/04/2021 Procedure   Successful placement of a power injectable Port-A-Cath via the right internal jugular vein. The catheter is ready for immediate use.   01/08/2021 - 02/19/2021 Chemotherapy    Patient is on Treatment Plan: OVARIAN CARBOPLATIN (AUC 6) / PACLITAXEL (175) Q21D X 6 CYCLES       01/29/2021 Tumor Marker   Patient's tumor was tested for the following markers: CA-125. Results of the tumor marker test revealed 59.   02/05/2021 Imaging   Right:  No evidence of superficial vein thrombosis in the upper extremity.  Findings  consistent with acute deep vein thrombosis involving the right internal jugular vein.     Left:  No evidence of thrombosis in the subclavian.       03/10/2021 Tumor Marker   Patient's tumor was tested for the following markers: CA-125. Results of the tumor marker test revealed 81.8.   03/11/2021 Imaging   Stable bulky calcified retroperitoneal lymphadenopathy, consistent with metastatic disease.   Stable right hepatic lobe lesion, likely representing a benign hemangioma. This shoeds no FDG uptake on previous PET-CT scan.   No evidence of new or progressive disease within the abdomen or pelvis.   Aortic Atherosclerosis (ICD10-I70.0).    04/06/2021 Procedure   Successful removal of implanted Port-A-Cath.   04/29/2021 Tumor Marker   Patient's tumor was tested for the following markers: CA-125. Results of the tumor marker test revealed 29.4.     Interval History: Patient is having some bone pain related to her letrozole.  She has been trying to stay busy which is helping to distract her.  She notes that bone pain occurs most of the time and Tylenol does not help her symptoms much.  With regard to her other symptoms, she saw gastroenterology.  The pain that she was having prior to surgery has improved significantly but she still has some abdominal pain.  She also has difficulty knowing if she is going to have episodes of diarrhea.  With regard to other symptoms from the letrozole, she notes having some facial hair.  She continues to endorse feeling like she has brain fog, similar to during and after chemotherapy.  She continues to also have vertigo, which predates any of her cancer diagnosis or treatments.  Past Medical/Surgical History: Past Medical History:  Diagnosis Date   Arthritis    Chronic pain    Contact lens/glasses fitting    wears contacts or glasses   Depression    Family history of melanoma 04/29/2021   Fibromyalgia    Melanoma (HCC)    Peritoneal carcinoma (HCC)    PONV (postoperative nausea and vomiting)    prolonged sedation very hard to wake    Past Surgical History:  Procedure Laterality Date   BACK SURGERY     lumb lam  BREAST BIOPSY Right    2021   BREAST BIOPSY Left    BREAST ENHANCEMENT SURGERY  05/30/1980   implants    BREAST IMPLANT EXCHANGE  05/30/2009   CARPAL TUNNEL RELEASE  05/30/2001   rt and lt   CARPOMETACARPEL SUSPENSION PLASTY  06/22/2012   Procedure: CARPOMETACARPEL ([place]) SUSPENSION PLASTY;  Surgeon: Alta Corning, MD;  Location: [place];  Service: Orthopedics;  Laterality: Left;  burton's interposition arthroplasty left thumb    CATARACT EXTRACTION W/  INTRAOCULAR LENS  IMPLANT, BILATERAL     COLONOSCOPY     x2   DIAGNOSTIC LAPAROSCOPY  05/30/2000   expl lap   DILATION AND CURETTAGE OF UTERUS  05/30/2001   ablation   ELBOW SURGERY  05/31/1999   decompression rt   EPIDURAL BLOCK INJECTION     multiple   IR IMAGING GUIDED PORT INSERTION  01/04/2021   IR REMOVAL TUN ACCESS W/ PORT W/O FL MOD SED  04/06/2021   LYMPH NODE DISSECTION N/A 12/01/2020   Procedure: LYMPH NODE DISSECTION;  Surgeon: Lafonda Mosses, MD;  Location: [facility];  Service: Gynecology;  Laterality: N/A;   SKIN CANCER EXCISION     melanoma, from abdomen   TONSILLECTOMY      Family History  Problem Relation Age of Onset   Melanoma Mother        behind ear; on leg; dx after 75   Basal cell carcinoma Mother        face   Lung cancer Father 41       smoking hx   Leukemia Maternal Grandmother        dx after 73   Lung cancer Cousin        maternal female cousin; dx after 62; smoking hx   Cancer Cousin        paternal female cousin; unknown type; dx after 18   Colon cancer Neg Hx    Breast cancer Neg Hx    Ovarian cancer Neg Hx    Endometrial cancer Neg Hx    Pancreatic cancer Neg Hx    Prostate cancer Neg Hx     Social History   Socioeconomic History   Marital status: Married    Spouse name: Not on file   Number of children: Not on file   Years of education: Not on file   Highest education level: Not on file  Occupational History   Not on file  Tobacco Use   Smoking status: Never   Smokeless tobacco: Never  Vaping Use   Vaping Use: Never used  Substance and Sexual Activity   Alcohol use: Yes    Comment: rare   Drug use: No   Sexual activity: Yes    Partners: Male    Birth control/protection: Post-menopausal  Other Topics Concern   Not on file  Social History Narrative   Not on file   Social Determinants of Health   Financial Resource Strain: Not on file  Food Insecurity: Not on file  Transportation Needs: Not on file  Physical Activity:  Not on file  Stress: Not on file  Social Connections: Not on file    Current Medications:  Current Outpatient Medications:    alum & mag hydroxide-simeth (MAALOX/MYLANTA) 200-200-20 MG/5ML suspension, Take 30 mLs by mouth every 4 (four) hours as needed for up to 6 doses for indigestion or heartburn., Disp: 180 mL, Rfl: 0   apixaban (ELIQUIS) 5 MG TABS tablet, Take 5 mg by mouth 2 (  two) times daily. (Patient not taking: Reported on 04/12/2021), Disp: , Rfl:    famotidine (PEPCID) 20 MG tablet, Take 1 tablet (20 mg total) by mouth daily. Can take a second dose as needed daily, Disp: 40 tablet, Rfl: 3   FLUoxetine (PROZAC) 20 MG tablet, Take 10 mg by mouth every evening., Disp: , Rfl:    hyoscyamine (LEVSIN SL) 0.125 MG SL tablet, Place 1 tablet (0.125 mg total) under the tongue every 6 (six) hours as needed., Disp: 60 tablet, Rfl: 1   letrozole (FEMARA) 2.5 MG tablet, Take 1 tablet (2.5 mg total) by mouth daily., Disp: 30 tablet, Rfl: 3   Misc Natural Products (ELDERBERRY IMMUNE COMPLEX PO), Take 5 mLs by mouth daily., Disp: , Rfl:    Probiotic Product (PROBIOTIC PO), Take 1 capsule by mouth in the morning., Disp: , Rfl:    traZODone (DESYREL) 50 MG tablet, Take 1 tablet (50 mg total) by mouth at bedtime., Disp: 30 tablet, Rfl: 3  Review of Symptoms: Pertinent positives as per HPI.  Physical Exam: There were no vitals taken for this visit. Deferred in the setting of a phone visit.  Laboratory & Radiologic Studies: None new  Assessment & Plan: [name] [name] is a 65 y.o. woman with Stage IIIC presumed primary peritoneal carcinoma, low-grade.  Had a long discussion with the patient about her side effects related to letrozole.  While she is bothered by them, she has been able to cope at this time.  We discussed medical therapy to treat bone pain related to letrozole, changing to a different aromatase inhibitor to see if her symptoms are any better, or changing to tamoxifen.  We  briefly discussed some of the more serious side effects related to tamoxifen.  She asked about stopping the letrozole for several weeks to see if this is contributing to her bone pain.  I think this is unnecessary as she was not having bone pain prior to starting the letrozole and this is a known side effect related to aromatase inhibitors.  She would like some time to read about tamoxifen and to talk to her husband.  She will let me know when she is ready to make a decision about staying on letrozole, changing to a different AI, or changing to tamoxifen.  I discussed the assessment and treatment plan with the patient. The patient was provided with an opportunity to ask questions and all were answered. The patient agreed with the plan and demonstrated an understanding of the instructions.   The patient was advised to call back or see an in-person evaluation if the symptoms worsen or if the condition fails to improve as anticipated.   22 minutes of total time was spent for this patient encounter, including preparation, face-to-face counseling with the patient and coordination of care, and documentation of the encounter.   Jeral Pinch, MD  Division of Gynecologic Oncology  Department of Obstetrics and Gynecology  [facility]

## 2021-05-25 ENCOUNTER — Ambulatory Visit: Payer: Self-pay | Admitting: Genetic Counselor

## 2021-05-25 ENCOUNTER — Encounter: Payer: Self-pay | Admitting: Genetic Counselor

## 2021-05-25 ENCOUNTER — Telehealth: Payer: Self-pay | Admitting: Genetic Counselor

## 2021-05-25 DIAGNOSIS — C481 Malignant neoplasm of specified parts of peritoneum: Secondary | ICD-10-CM

## 2021-05-25 DIAGNOSIS — Z808 Family history of malignant neoplasm of other organs or systems: Secondary | ICD-10-CM

## 2021-05-25 DIAGNOSIS — Z1379 Encounter for other screening for genetic and chromosomal anomalies: Secondary | ICD-10-CM

## 2021-05-25 NOTE — Progress Notes (Signed)
HPI:   [name] was previously seen in the [place] clinic due to a personal history of peritoneal cancer and concerns regarding a hereditary predisposition to cancer. Please refer to our prior cancer genetics clinic note for more information regarding our discussion, assessment and recommendations, at the time. [name]'s recent genetic test results were disclosed to her, as were recommendations warranted by these results. These results and recommendations are discussed in more detail below.  CANCER HISTORY:  Oncology History Overview Note  Low grade serous, ER positive [facility] done on 8/9: HRD not detected, MSI stable, low tumor mutational burden 1 Muts/Mb, no alterations of BRCA1 & 2   Extraovarian primary peritoneal carcinoma ([place])  09/29/2020 Imaging   MRI lumbar spine The dominant finding in this case is that of massive retroperitoneal lymphadenopathy, likely related to either metastatic disease or lymphoma.   Chronic degenerative and postoperative findings of the spine as outlined in detail above   10/05/2020 Imaging   IMPRESSION outside CT 1. Large calcified retroperitoneal lymph nodes highly concerning for metastatic disease, likely related to primary carcinoma of the colon or lymphoma. Clinical correlation is recommended. 2. No bowel obstruction. Normal appendix. 3. Stable right hepatic dome hemangioma.   10/30/2020 PET scan   1. The densely calcified retroperitoneal adenopathy is highly hypermetabolic with maximum SUV of 23.4 (Deauville 5) there is also Deauville 3 activity in a small right axillary lymph node. Given the lack of an obvious primary outside of the retroperitoneum, appearance tends to favor lymphoma or Castleman's disease. Tissue diagnosis suggested. 2. Other imaging findings of potential clinical significance: Chronic left maxillary sinusitis. Aortic Atherosclerosis (ICD10-I70.0). Coronary atherosclerosis.   11/10/2020 Procedure   CT  guided biopsy of retroperitoneal adenopathy   11/10/2020 Pathology Results   biopsy results which favor a low-grade serous carcinoma of gyn origin. This is a somewhat atypical presentation, but my suspicion is that it is primary peritoneal carcinoma   11/16/2020 Tumor Marker   Patient's tumor was tested for the following markers: CA-125. Results of the tumor marker test revealed 96.4.   12/01/2020 Pathology Results   SURGICAL PATHOLOGY  CASE: [phone]  PATIENT: [name] [name]  Surgical Pathology Report   Clinical History: Metastatic low grade serous carcinoma of gyn origin  ([facility])    FINAL MICROSCOPIC DIAGNOSIS:   A. UTERUS, CERVIX, BILATERAL FALLOPIAN TUBES AND OVARIES:  Uterus:  -  Inactive endometrium  -  Leiomyoma (1.1 cm; largest)  -  Calcified nodule of the broad ligament with psammoma bodies (right)  -  No hyperplasia or malignancy identified   Cervix:  -  Benign cervix  -  No dysplasia or malignancy identified   Ovaries, bilateral:  -  Serous cyst adenofibroma with microscopic focus of borderline change  (right)    Fallopian tubes, bilateral:  -  Benign fallopian tube with paratubal cyst (right)  -  No malignancy identified   B. OMENTUM, OMENTECTOMY:  -  No carcinoma identified   C. PERIAORTIC TUMOR, EXCISION:  -  Serous carcinoma, low-grade, with psammoma bodies  -  See comment below   COMMENT:   C.  At the request of the treating clinician, immunohistochemistry was repeated.  The neoplastic cells are positive for ER, cytokeratin 7, cytokeratin 5/6, p53, PAX8 and WT1 but negative for cytokeratin 20,  TTF-1, PR and thyroglobulin.  The morphology and immunophenotype are consistent with a primary peritoneal low-grade serous carcinoma (see also, [id number]).   ONCOLOGY TABLE:   OVARY or FALLOPIAN TUBE  or PRIMARY PERITONEUM: Resection   Procedure: Total hysterectomy and bilateral salpingo-oophorectomy with omentectomy and periaortic tumor excision   Specimen Integrity: Intact  Tumor Site: Primary peritoneum (see comment)  Tumor Size: See comment  Histologic Type: Low-grade serous carcinoma  Histologic Grade: Low-grade  Ovarian Surface Involvement: Not identified  Fallopian Tube Surface Involvement: Not identified  Implants (required for advanced stage serous/seromucinous borderline  tumors only): Not applicable  Other Tissue/ Organ Involvement: Periaortic tumor  Largest Extrapelvic Peritoneal Focus: 5.5 cm (aggregate measurement)  Peritoneal/Ascitic Fluid Involvement: Not applicable  Chemotherapy Response Score (CRS): Not applicable, no known presurgical therapy  Regional Lymph Nodes: Not applicable (no lymph nodes submitted or found)  Distant Metastasis:       Distant Site(s) Involved: Not applicable  Pathologic Stage Classification (pTNM, AJCC 8th Edition): pT3, pN not assigned  Ancillary Studies: Can be performed upon request  Representative Tumor Block: C1  Comment(s): The only focus of low-grade serous carcinoma identified in the resection specimen is the tissue submitted as periaortic tumor (part C).  Clinically, this was favored to represent matted lymph nodes; however, there is no evidence of nodal tissue in the examined material. This case was discussed with Dr. Berline Lopes and given the extent of the matted lymph nodes, this is being staged as a pT3.     12/01/2020 Surgery   Pre-operative Diagnosis: low grade serous carcinoma with significant retroperitoneal adenopathy, no other obvious imaging evidence of disease   Post-operative Diagnosis: same, significantly adherent presacral and para-aortic lymphadenopathy   Operation: Robotic-assisted laparoscopic total hysterectomy with bilateral salpingo-oophorectomy, laparotomy, infra-colic omentectomy, portion of necrotic tumor-ridden retroperitoneal lymph nodes, oversew of sigmoid serosa   Surgeon: Jeral Pinch MD   Assistant Surgeon: Lahoma Crocker MD (an MD assistant was  necessary for tissue manipulation, management of robotic instrumentation, retraction and positioning due to the complexity of the case and hospital policies).    Intra-operative consultant:  Everitt Amber MD    Operative Findings: On EUA, small mobile uterus. ON intra-abdominal entry, normal liver and diaphragm. One filmy adhesion noted between the right liver lobe and the anterior abdominal wall. The spleen was somewhat lateral and appeared mildly enlarged. There were some adhesions of the infragastric omentum to the anterior abdominal wall in the midline. The omentum was normal appearing without obvious evidence of tumor. Small and large bowel were normal appearing, appendix normal. Uterus 6cm and normal in appearance. Somewhat atrophic bilateral adnexa. There were filmy adhesions between each adnexa and the pelvic sidewall. Some filmy adhesions were also noted between the posterior uterus and rectum. Approximately 0.5-1cm white nodule within the broad ligament, lateral to the right cornua, c/w possible fibroma vs fibroid. No pelvic adenopathy. Significantly enlarged, necrotic, and tumor-filled lymph node conglomeration was noted starting within the presacral space (adherent to the sacral promontory) and extending superiorly along the great vessels to the level of the renal vessel (suspected). Given adherence of the involved lymph nodes, I could not visualize this anatomy, I could long palpate along each lateral aspect of the aorta. There was no identifiable plane between matted lymph nodes and underlying aorta or IVC. Given burden of disease and my concern that resection was not feasible, I asked my senior partner (Dr. Denman George) to evaluate intra-op. She agreed that moving forward with attempt at debulking involved lymph nodes was likely not feasible and an attempt at this time would be unsafe.   There was no obvious peritoneal disease and no clear involvement of gyn organs. Despite prior biopsy, findings  at  surgery remain concerning for a non-gynecologic process. If this is confirmed to be low-grade serous carcinoma of gyn origin, this was an R2 resection.    12/15/2020 Initial Diagnosis   Extraovarian primary peritoneal carcinoma ([place])   12/15/2020 Cancer Staging   Staging form: [place], AJCC 7th Edition - Pathologic stage from 12/15/2020: Stage IIIC (T3c, N0, cM0) - Signed by Heath Lark, MD on 12/15/2020 Staged by: Managing physician Stage prefix: Initial diagnosis    01/04/2021 Procedure   Successful placement of a power injectable Port-A-Cath via the right internal jugular vein. The catheter is ready for immediate use.   01/08/2021 - 02/19/2021 Chemotherapy    Patient is on Treatment Plan: OVARIAN CARBOPLATIN (AUC 6) / PACLITAXEL (175) Q21D X 6 CYCLES       01/29/2021 Tumor Marker   Patient's tumor was tested for the following markers: CA-125. Results of the tumor marker test revealed 59.   02/05/2021 Imaging   Right:  No evidence of superficial vein thrombosis in the upper extremity.  Findings  consistent with acute deep vein thrombosis involving the right internal jugular vein.     Left:  No evidence of thrombosis in the subclavian.       03/10/2021 Tumor Marker   Patient's tumor was tested for the following markers: CA-125. Results of the tumor marker test revealed 81.8.   03/11/2021 Imaging   Stable bulky calcified retroperitoneal lymphadenopathy, consistent with metastatic disease.   Stable right hepatic lobe lesion, likely representing a benign hemangioma. This shoeds no FDG uptake on previous PET-CT scan.   No evidence of new or progressive disease within the abdomen or pelvis.   Aortic Atherosclerosis (ICD10-I70.0).   04/06/2021 Procedure   Successful removal of implanted Port-A-Cath.   04/29/2021 Tumor Marker   Patient's tumor was tested for the following markers: CA-125. Results of the tumor marker test revealed 29.4.   05/22/2021 Genetic Testing   Negative  hereditary cancer genetic testing: no pathogenic variants detected in [facility] Multi-Cancer +RNA Panel.  The report date is 05/22/2021.   The Multi-Cancer + RNA Panel offered by [facility] includes sequencing and/or deletion/duplication analysis of the following 84 genes:  AIP*, ALK, APC*, ATM*, AXIN2*, BAP1*, BARD1*, BLM*, BMPR1A*, BRCA1*, BRCA2*, BRIP1*, CASR, CDC73*, CDH1*, CDK4, CDKN1B*, CDKN1C*, CDKN2A, CEBPA, CHEK2*, CTNNA1*, DICER1*, DIS3L2*, EGFR, EPCAM, FH*, FLCN*, GATA2*, GPC3, GREM1, HOXB13, HRAS, KIT, MAX*, MEN1*, MET, MITF, MLH1*, MSH2*, MSH3*, MSH6*, MUTYH*, NBN*, NF1*, NF2*, NTHL1*, PALB2*, PDGFRA, PHOX2B, PMS2*, POLD1*, POLE*, POT1*, PRKAR1A*, PTCH1*, PTEN*, RAD50*, RAD51C*, RAD51D*, RB1*, RECQL4, RET, RUNX1*, SDHA*, SDHAF2*, SDHB*, SDHC*, SDHD*, SMAD4*, SMARCA4*, SMARCB1*, SMARCE1*, STK11*, SUFU*, TERC, TERT, TMEM127*, Tp53*, TSC1*, TSC2*, VHL*, WRN*, and WT1.  RNA analysis is performed for * genes.     FAMILY HISTORY:  We obtained a detailed, 4-generation family history.  Significant diagnoses are listed below: Family History  Problem Relation Age of Onset   Melanoma Mother        behind ear; on leg; dx after 43   Basal cell carcinoma Mother        face   Lung cancer Father 85       smoking hx   Leukemia Maternal Grandmother        dx after 44   Lung cancer Cousin        maternal female cousin; dx after 56; smoking hx   Cancer Cousin        paternal female cousin; unknown type; dx after 42    Ms. Panzer is unaware of  previous family history of genetic testing for hereditary cancer risks. There is no reported Ashkenazi Jewish ancestry. There is no known consanguinity.  GENETIC TEST RESULTS:  The [facility] Multi-Cancer +RNA Panel found no pathogenic mutations. The Multi-Cancer + RNA Panel offered by [facility] includes sequencing and/or deletion/duplication analysis of the following 84 genes:  AIP*, ALK, APC*, ATM*, AXIN2*, BAP1*, BARD1*, BLM*, BMPR1A*, BRCA1*, BRCA2*, BRIP1*,  CASR, CDC73*, CDH1*, CDK4, CDKN1B*, CDKN1C*, CDKN2A, CEBPA, CHEK2*, CTNNA1*, DICER1*, DIS3L2*, EGFR, EPCAM, FH*, FLCN*, GATA2*, GPC3, GREM1, HOXB13, HRAS, KIT, MAX*, MEN1*, MET, MITF, MLH1*, MSH2*, MSH3*, MSH6*, MUTYH*, NBN*, NF1*, NF2*, NTHL1*, PALB2*, PDGFRA, PHOX2B, PMS2*, POLD1*, POLE*, POT1*, PRKAR1A*, PTCH1*, PTEN*, RAD50*, RAD51C*, RAD51D*, RB1*, RECQL4, RET, RUNX1*, SDHA*, SDHAF2*, SDHB*, SDHC*, SDHD*, SMAD4*, SMARCA4*, SMARCB1*, SMARCE1*, STK11*, SUFU*, TERC, TERT, TMEM127*, Tp53*, TSC1*, TSC2*, VHL*, WRN*, and WT1.  RNA analysis is performed for * genes.  The test report has been scanned into EPIC and is located under the Molecular Pathology section of the Results Review tab.  A portion of the result report is included below for reference. Genetic testing reported out on May 22, 2021.       Even though a pathogenic variant was not identified, possible explanations for the cancer in the family may include: There may be no hereditary risk for cancer in the family. The cancers in Ms. Khaimov and/or her family may be sporadic/familial or due to other genetic and environmental factors. There may be a gene mutation in one of these genes that current testing methods cannot detect but that chance is small. There could be another gene that has not yet been discovered, or that we have not yet tested, that is responsible for the cancer diagnoses in the family.  It is also possible there is a hereditary cause for the cancer in the family that Ms. Goodgame did not inherit.   Therefore, it is important to remain in touch with cancer genetics in the future so that we can continue to offer Ms. Kilcrease the most up to date genetic testing.     ADDITIONAL GENETIC TESTING:  We discussed with Ms. Moeser that her genetic testing was fairly extensive.  If there are genes identified to increase cancer risk that can be analyzed in the future, we would be happy to discuss and coordinate this testing  at that time.    CANCER SCREENING RECOMMENDATIONS:  [name]'s test result is considered negative (normal).  This means that we have not identified a hereditary cause for her personal history of peritoneal cancer at this time.   An individual's cancer risk and medical management are not determined by genetic test results alone. Overall cancer risk assessment incorporates additional factors, including personal medical history, family history, and any available genetic information that may result in a personalized plan for cancer prevention and surveillance. Therefore, it is recommended she continue to follow the cancer management and screening guidelines provided by her oncology and primary healthcare provider.  RECOMMENDATIONS FOR FAMILY MEMBERS:   Since she did not inherit a identifiable mutation in a cancer predisposition gene included on this panel, her son and grandson could not have inherited a known mutation from her in one of these genes. Individuals in this family might be at some increased risk of developing cancer, over the general population risk, due to the family history of cancer.  Individuals in the family should notify their providers of the family history of cancer. We recommend women in this family have a yearly mammogram beginning at age  5, or 10 years younger than the earliest onset of cancer, an annual clinical breast exam, and perform monthly breast self-exams.  Family members should have colonoscopies by at age 59, or earlier, as recommended by their providers.  Family members should consider skin exams with a dermatologist based on the family history of melanoma.    FOLLOW-UP:  Lastly, we discussed with Ms. Principato that cancer genetics is a rapidly advancing field and it is possible that new genetic tests will be appropriate for her and/or her family members in the future. We encouraged her to remain in contact with cancer genetics on an annual basis so we can update her  personal and family histories and let her know of advances in cancer genetics that may benefit this family.   Our contact number was provided. [name]'s questions were answered to her satisfaction, and she knows she is welcome to call us at anytime with additional questions or concerns.   [name] [name] M. Joette Catching, [place], [facility] Genetic Counselor [email or web address] (P) [phone]

## 2021-05-25 NOTE — Telephone Encounter (Signed)
Revealed negative genetic testing.  Discussed that we do not know why she has cancer or why there is cancer in the family. It could be sporadic/familial, due to a different gene that we are not testing, or maybe our current technology may not be able to pick something up.  It will be important for her to keep in contact with genetics to keep up with whether additional testing may be needed.

## 2021-06-02 ENCOUNTER — Encounter: Payer: Self-pay | Admitting: Gynecologic Oncology

## 2021-06-14 ENCOUNTER — Other Ambulatory Visit: Payer: Self-pay | Admitting: Gynecologic Oncology

## 2021-06-14 ENCOUNTER — Telehealth: Payer: Self-pay | Admitting: *Deleted

## 2021-06-14 DIAGNOSIS — C481 Malignant neoplasm of specified parts of peritoneum: Secondary | ICD-10-CM

## 2021-06-14 MED ORDER — TAMOXIFEN CITRATE 20 MG PO TABS: 20.0000 mg | ORAL_TABLET | Freq: Two times a day (BID) | ORAL | 3 refills | Status: DC

## 2021-06-14 NOTE — Telephone Encounter (Signed)
Patient called and stated "I had ben on hormone therapy and then stopped. Dr Berline Lopes suggested tamoxifen. I would like to try that." Pharmacy confirmed

## 2021-06-14 NOTE — Telephone Encounter (Signed)
Following up with [name] [name] regarding tamoxifen. Notified patient that tamoxifen prescription has been sent it. Reviewed possible blood clot and stroke side effects and signs and symptoms to look for. Patient verbalized understanding. Patient states she is planning a trip in the spring and will be on a long flight. She is concerned with increase risk of blood clots. Encouraged patient to walk and stretch her legs frequently in aisle, wear compression stockings and perform legs movement/exercises while in her seat. Patient verbalized understanding. Instructed to call with any questions or concerns.

## 2021-06-16 ENCOUNTER — Other Ambulatory Visit: Payer: Self-pay | Admitting: Gynecologic Oncology

## 2021-06-16 DIAGNOSIS — C569 Malignant neoplasm of unspecified ovary: Secondary | ICD-10-CM

## 2021-06-16 NOTE — Progress Notes (Signed)
CT ordered - plan for repeat imaging in mid-February to assess response to hormonal treatment.  Jeral Pinch MD Gynecologic Oncology

## 2021-06-21 ENCOUNTER — Telehealth: Payer: Self-pay | Admitting: *Deleted

## 2021-06-21 NOTE — Telephone Encounter (Signed)
Per Dr Berline Lopes scheduled the patient for a CT Scan on 2/13 at 8:30 am. Spoke with the patient and gave the appt date/time. Patient stated that "she was with her mother at the hospital and would call back for to schedule a lab appt and to pick up her CT contrast."

## 2021-07-07 ENCOUNTER — Inpatient Hospital Stay: Payer: Medicare Other | Attending: Hematology and Oncology

## 2021-07-07 ENCOUNTER — Other Ambulatory Visit: Payer: Self-pay

## 2021-07-07 DIAGNOSIS — C569 Malignant neoplasm of unspecified ovary: Secondary | ICD-10-CM

## 2021-07-07 DIAGNOSIS — C481 Malignant neoplasm of specified parts of peritoneum: Secondary | ICD-10-CM

## 2021-07-07 DIAGNOSIS — C482 Malignant neoplasm of peritoneum, unspecified: Secondary | ICD-10-CM | POA: Insufficient documentation

## 2021-07-07 LAB — BASIC METABOLIC PANEL - CANCER CENTER ONLY
Anion gap: 8 (ref 5–15)
BUN: 15 mg/dL (ref 8–23)
CO2: 29 mmol/L (ref 22–32)
Calcium: 9.7 mg/dL (ref 8.9–10.3)
Chloride: 102 mmol/L (ref 98–111)
Creatinine: 0.89 mg/dL (ref 0.44–1.00)
GFR, Estimated: >60 mL/min (ref >60)
Glucose, Bld: 97 mg/dL (ref 70–99)
Potassium: 4.1 mmol/L (ref 3.5–5.1)
Sodium: 139 mmol/L (ref 135–145)

## 2021-07-08 LAB — CA 125: Cancer Antigen (CA) 125: 16 U/mL (ref 0.0–38.1)

## 2021-07-12 ENCOUNTER — Other Ambulatory Visit: Payer: Self-pay

## 2021-07-12 ENCOUNTER — Ambulatory Visit (HOSPITAL_COMMUNITY)
Admission: RE | Admit: 2021-07-12 | Discharge: 2021-07-12 | Disposition: A | Discharge status: Home / Self Care | Payer: Medicare Other | Source: Ambulatory Visit | Attending: Gynecologic Oncology | Admitting: Gynecologic Oncology

## 2021-07-12 ENCOUNTER — Encounter (HOSPITAL_COMMUNITY): Payer: Self-pay

## 2021-07-12 DIAGNOSIS — I700 Atherosclerosis of aorta: Secondary | ICD-10-CM | POA: Insufficient documentation

## 2021-07-12 DIAGNOSIS — C569 Malignant neoplasm of unspecified ovary: Secondary | ICD-10-CM | POA: Diagnosis present

## 2021-07-12 DIAGNOSIS — K769 Liver disease, unspecified: Secondary | ICD-10-CM | POA: Insufficient documentation

## 2021-07-12 MED ORDER — IOHEXOL 300 MG/ML  SOLN
100.0000 mL | Freq: Once | INTRAMUSCULAR | Status: AC | PRN
  Administered 2021-07-12: 100 mL via INTRAVENOUS

## 2021-07-12 MED ORDER — SODIUM CHLORIDE (PF) 0.9 % IJ SOLN
INTRAMUSCULAR | Status: AC
  Filled 2021-07-12: qty 50

## 2021-07-13 ENCOUNTER — Telehealth: Payer: Self-pay | Admitting: Gynecologic Oncology

## 2021-07-13 ENCOUNTER — Telehealth: Payer: Self-pay | Admitting: *Deleted

## 2021-07-13 NOTE — Telephone Encounter (Signed)
Per Dr Berline Lopes called and scheduled the patient for a follow up appt for April 14 th

## 2021-07-13 NOTE — Telephone Encounter (Signed)
I called the patient to discuss recent CT scan results.  Area of adenopathy that we have been following showed slight decrease in size on hormonal therapy.  Patient stopped letrozole about a month ago secondary to side effects.  She ultimately decided that she was amenable to trying tamoxifen.  A prescription has been sent in but she has not started it yet.  She was waiting until after she and her husband got back from a trip but unfortunately the trip was canceled as her mother fell and was briefly in a nursing home.  Her mother is now back at home with him.  The patient is amenable to starting medication tomorrow.  She notes overall feeling better.  Her abdominal symptoms have improved.  She denies any abdominal pain.  She has seen gastroenterology who thinks that there is likely a component of IBS.  If her symptoms were to continue or worsen, then they would consider performing endoscopy.  Patient has gained several pounds since I last saw her.  She continues to have some aching in her hands and knees although this has improved since stopping the letrozole.  We discussed CA-125 from last week which has decreased again.  Plan will be for follow-up in 2 months in clinic with me.  I have asked the patient to let me know in about a month how she is doing on the tamoxifen.  Jeral Pinch MD Gynecologic Oncology

## 2021-09-08 ENCOUNTER — Encounter: Payer: Self-pay | Admitting: Gynecologic Oncology

## 2021-09-10 ENCOUNTER — Ambulatory Visit: Payer: Medicare Other | Admitting: Gynecologic Oncology

## 2021-09-13 ENCOUNTER — Inpatient Hospital Stay: Payer: Medicare Other

## 2021-09-13 ENCOUNTER — Other Ambulatory Visit: Payer: Self-pay

## 2021-09-13 ENCOUNTER — Inpatient Hospital Stay: Payer: Medicare Other | Attending: Hematology and Oncology | Admitting: Gynecologic Oncology

## 2021-09-13 ENCOUNTER — Encounter: Payer: Self-pay | Admitting: Gynecologic Oncology

## 2021-09-13 VITALS — BP 136/79 | HR 58 | Temp 98.7°F | Resp 16 | Ht 62.99 in | Wt 140.0 lb

## 2021-09-13 DIAGNOSIS — Z7901 Long term (current) use of anticoagulants: Secondary | ICD-10-CM | POA: Diagnosis not present

## 2021-09-13 DIAGNOSIS — C569 Malignant neoplasm of unspecified ovary: Secondary | ICD-10-CM

## 2021-09-13 DIAGNOSIS — R197 Diarrhea, unspecified: Secondary | ICD-10-CM | POA: Insufficient documentation

## 2021-09-13 DIAGNOSIS — K589 Irritable bowel syndrome without diarrhea: Secondary | ICD-10-CM

## 2021-09-13 DIAGNOSIS — C482 Malignant neoplasm of peritoneum, unspecified: Secondary | ICD-10-CM | POA: Diagnosis present

## 2021-09-13 DIAGNOSIS — N952 Postmenopausal atrophic vaginitis: Secondary | ICD-10-CM

## 2021-09-13 DIAGNOSIS — Z79899 Other long term (current) drug therapy: Secondary | ICD-10-CM | POA: Diagnosis not present

## 2021-09-13 DIAGNOSIS — Z79810 Long term (current) use of selective estrogen receptor modulators (SERMs): Secondary | ICD-10-CM | POA: Insufficient documentation

## 2021-09-13 DIAGNOSIS — M898X9 Other specified disorders of bone, unspecified site: Secondary | ICD-10-CM

## 2021-09-13 MED ORDER — ESTRADIOL 0.1 MG/GM VA CREA
1.0000 | TOPICAL_CREAM | VAGINAL | 12 refills | Status: AC
Start: 2021-09-13 — End: ?

## 2021-09-13 NOTE — Progress Notes (Signed)
Gynecologic Oncology Return Clinic Visit ? ?09/13/2021 ? ?Reason for Visit: Surveillance visit in the setting of low-grade serous carcinoma ? ?Treatment History: ?Oncology History Overview Note  ?Low grade serous, ER positive ?[place] [facility] done on 8/9: HRD not detected, MSI stable, low tumor mutational burden 1 Muts/Mb, no alterations of BRCA1 & 2 ?  ?Extraovarian primary peritoneal carcinoma ([place])  ?09/29/2020 Imaging  ? MRI lumbar spine ?The dominant finding in this case is that of massive retroperitoneal lymphadenopathy, likely related to either metastatic disease or lymphoma. ?  ?Chronic degenerative and postoperative findings of the spine as outlined in detail above ?  ?10/05/2020 Imaging  ? IMPRESSION outside CT ?1. Large calcified retroperitoneal lymph nodes highly concerning for metastatic disease, likely related to primary carcinoma of the colon or lymphoma. Clinical correlation is recommended. ?2. No bowel obstruction. Normal appendix. ?3. Stable right hepatic dome hemangioma. ?  ?10/30/2020 PET scan  ? 1. The densely calcified retroperitoneal adenopathy is highly hypermetabolic with maximum SUV of 23.4 (Deauville 5) there is also Deauville 3 activity in a small right axillary lymph node. Given the lack of an obvious primary outside of the retroperitoneum, appearance tends to favor lymphoma or Castleman's disease. Tissue diagnosis suggested. ?2. Other imaging findings of potential clinical significance: Chronic left maxillary sinusitis. Aortic Atherosclerosis ?(ICD10-I70.0). Coronary atherosclerosis. ?  ?11/10/2020 Procedure  ? CT guided biopsy of retroperitoneal adenopathy ?  ?11/10/2020 Pathology Results  ? biopsy results which favor a low-grade serous carcinoma of gyn origin. This is a somewhat atypical presentation, but my suspicion is that it is primary peritoneal carcinoma ?  ?11/16/2020 Tumor Marker  ? Patient's tumor was tested for the following markers: CA-125. ?Results of the tumor marker test revealed  96.4. ?  ?12/01/2020 Pathology Results  ? SURGICAL PATHOLOGY  ?CASE: [id number]  ?PATIENT: [name] [name]  ?Surgical Pathology Report  ? 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 ? ?FINAL MICROSCOPIC DIAGNOSIS:  ? ?A. UTERUS, CERVIX, BILATERAL FALLOPIAN TUBES AND OVARIES:  ?Uterus:  ?-  Inactive endometrium  ?-  Leiomyoma (1.1 cm; largest)  ?-  Calcified nodule of the broad ligament with psammoma bodies (right)  ?-  No hyperplasia or malignancy identified  ? ?Cervix:  ?-  Benign cervix  ?-  No dysplasia or malignancy identified  ? ?Ovaries, bilateral:  ?-  Serous cyst adenofibroma with microscopic focus of borderline change  ?(right)  ? ? Fallopian tubes, bilateral:  ?-  Benign fallopian tube with paratubal cyst (right)  ?-  No malignancy identified  ? ?B. OMENTUM, OMENTECTOMY:  ?-  No carcinoma identified  ? ?C. PERIAORTIC TUMOR, EXCISION:  ?-  Serous carcinoma, low-grade, with psammoma bodies  ?-  See comment below  ? ?COMMENT:  ? ?C.  At the request of the treating clinician, immunohistochemistry was repeated.  The neoplastic cells are positive for ER, cytokeratin 7, cytokeratin 5/6, p53, PAX8 and WT1 but negative for cytokeratin 20,  ?TTF-1, PR and thyroglobulin.  The morphology and immunophenotype are consistent with a primary peritoneal low-grade serous carcinoma (see also, [id number]).   ?ONCOLOGY TABLE:  ? ?OVARY or FALLOPIAN TUBE or PRIMARY PERITONEUM: Resection  ? ?Procedure: Total hysterectomy and bilateral salpingo-oophorectomy with omentectomy and periaortic tumor excision  ?Specimen Integrity: Intact  ?Tumor Site: Primary peritoneum (see comment)  ?Tumor Size: See comment  ?Histologic Type: Low-grade serous carcinoma  ?Histologic Grade: Low-grade  ?Ovarian Surface Involvement: Not identified  ?Fallopian Tube Surface Involvement: Not identified  ?Implants (required  for advanced stage serous/seromucinous borderline  ?tumors only): Not applicable  ?Other Tissue/  Organ Involvement: Periaortic tumor  ?Largest Extrapelvic Peritoneal Focus: 5.5 cm (aggregate measurement)  ?Peritoneal/Ascitic Fluid Involvement: Not applicable  ?Chemotherapy Response Score (CRS): Not applicable, no known presurgical therapy  ?Regional Lymph Nodes: Not applicable (no lymph nodes submitted or found)  ?Distant Metastasis:  ?     Distant Site(s) Involved: Not applicable  ?Pathologic Stage Classification (pTNM, AJCC 8th Edition): pT3, pN not assigned  ?Ancillary Studies: Can be performed upon request  ?Representative Tumor Block: C1  ?Comment(s): The only focus of low-grade serous carcinoma identified in the resection specimen is the tissue submitted as periaortic tumor (part C).  Clinically, this was favored to represent matted lymph nodes; however, there is no evidence of nodal tissue in the examined material. This case was discussed with Dr. Berline Lopes and given the extent of the matted lymph nodes, this is being staged as a pT3.  ? ?  ?12/01/2020 Surgery  ? Pre-operative Diagnosis: low grade serous carcinoma with significant retroperitoneal adenopathy, no other obvious imaging evidence of disease ?  ?Post-operative Diagnosis: same, significantly adherent presacral and para-aortic lymphadenopathy ?  ?Operation: Robotic-assisted laparoscopic total hysterectomy with bilateral salpingo-oophorectomy, laparotomy, infra-colic omentectomy, portion of necrotic tumor-ridden retroperitoneal lymph nodes, oversew of sigmoid serosa ?  ?Surgeon: Jeral Pinch MD ?  ?Assistant Surgeon: Lahoma Crocker MD (an MD assistant was necessary for tissue manipulation, management of robotic instrumentation, retraction and positioning due to the complexity of the case and hospital policies).  ?  ?Intra-operative consultant:  Everitt Amber MD ?   ?Operative Findings: On EUA, small mobile uterus. ON intra-abdominal entry, normal liver and diaphragm. One filmy adhesion noted between the right liver lobe and the anterior  abdominal wall. The spleen was somewhat lateral and appeared mildly enlarged. There were some adhesions of the infragastric omentum to the anterior abdominal wall in the midline. The omentum was normal appearing without obvious evidence of tumor. Small and large bowel were normal appearing, appendix normal. Uterus 6cm and normal in appearance. Somewhat atrophic bilateral adnexa. There were filmy adhesions between each adnexa and the pelvic sidewall. Some filmy adhesions were also noted between the posterior uterus and rectum. Approximately 0.5-1cm white nodule within the broad ligament, lateral to the right cornua, c/w possible fibroma vs fibroid. No pelvic adenopathy. Significantly enlarged, necrotic, and tumor-filled lymph node conglomeration was noted starting within the presacral space (adherent to the sacral promontory) and extending superiorly along the great vessels to the level of the renal vessel (suspected). Given adherence of the involved lymph nodes, I could not visualize this anatomy, I could long palpate along each lateral aspect of the aorta. There was no identifiable plane between matted lymph nodes and underlying aorta or IVC. Given burden of disease and my concern that resection was not feasible, I asked my senior partner (Dr. Denman George) to evaluate intra-op. She agreed that moving forward with attempt at debulking involved lymph nodes was likely not feasible and an attempt at this time would be unsafe. ?  ?There was no obvious peritoneal disease and no clear involvement of gyn organs. Despite prior biopsy, findings at surgery remain concerning for a non-gynecologic process. If this is confirmed to be low-grade serous carcinoma of gyn origin, this was an R2 resection.  ?  ?12/15/2020 Initial Diagnosis  ? Extraovarian primary peritoneal carcinoma ([place]) ?  ?12/15/2020 Cancer Staging  ? Staging form: Ovary, AJCC 7th Edition ?- Pathologic stage from 12/15/2020: Stage IIIC (T3c, N0,  cM0) - Signed by Heath Lark, MD on 12/15/2020 ?Staged by: Managing physician ?Stage prefix: Initial diagnosis ? ?  ?01/04/2021 Procedure  ? Successful placement of a power injectable Port-A-Cath via the right internal jugular vein. The

## 2021-09-13 NOTE — Patient Instructions (Addendum)
It was good to see you today.  I do not see or feel any evidence of cancer on your exam. ? ?I am glad that you are tolerating the tamoxifen better. ? ?We will plan to repeat a scan in June, 4 months after your last CT scan.  We will have a phone visit after your CT scan. ? ?I will release your CA-125 to you in MyChart when it comes back tomorrow. ? ?Sending in a prescription for vaginal estrogen.  I like you to use this 3 times a week at night.  Please let me know if for some reason your bleeding increases or you have any other symptoms when he starts this. ?

## 2021-09-14 LAB — CA 125: Cancer Antigen (CA) 125: 56.1 U/mL — ABNORMAL HIGH (ref 0.0–38.1)

## 2021-09-15 ENCOUNTER — Other Ambulatory Visit: Payer: Self-pay

## 2021-09-15 ENCOUNTER — Telehealth: Payer: Self-pay | Admitting: Oncology

## 2021-09-15 DIAGNOSIS — C481 Malignant neoplasm of specified parts of peritoneum: Secondary | ICD-10-CM

## 2021-09-15 NOTE — Telephone Encounter (Signed)
[name] [name] with CA 125 results.  She would like to move up her CT scan.  Advised I will call her back with rescheduled CT appointment. ?

## 2021-09-15 NOTE — Telephone Encounter (Signed)
Called [name] [name] back with CT scan appointment on 09/27/21 at 12:30 with labs at 11:30 before at the [facility].  She verbalized understanding and agreement. ?

## 2021-09-24 ENCOUNTER — Other Ambulatory Visit: Payer: Self-pay | Admitting: Gynecologic Oncology

## 2021-09-24 DIAGNOSIS — C481 Malignant neoplasm of specified parts of peritoneum: Secondary | ICD-10-CM

## 2021-09-27 ENCOUNTER — Inpatient Hospital Stay: Payer: Medicare Other | Attending: Hematology and Oncology

## 2021-09-27 ENCOUNTER — Other Ambulatory Visit: Payer: Self-pay

## 2021-09-27 ENCOUNTER — Ambulatory Visit (HOSPITAL_COMMUNITY)
Admission: RE | Admit: 2021-09-27 | Discharge: 2021-09-27 | Disposition: A | Discharge status: Home / Self Care | Payer: Medicare Other | Source: Ambulatory Visit | Attending: Gynecologic Oncology | Admitting: Gynecologic Oncology

## 2021-09-27 DIAGNOSIS — C481 Malignant neoplasm of specified parts of peritoneum: Secondary | ICD-10-CM

## 2021-09-27 DIAGNOSIS — C569 Malignant neoplasm of unspecified ovary: Secondary | ICD-10-CM | POA: Diagnosis present

## 2021-09-27 DIAGNOSIS — K769 Liver disease, unspecified: Secondary | ICD-10-CM | POA: Diagnosis not present

## 2021-09-27 DIAGNOSIS — I700 Atherosclerosis of aorta: Secondary | ICD-10-CM | POA: Insufficient documentation

## 2021-09-27 LAB — BASIC METABOLIC PANEL - CANCER CENTER ONLY
Anion gap: 5 (ref 5–15)
BUN: 16 mg/dL (ref 8–23)
CO2: 34 mmol/L — ABNORMAL HIGH (ref 22–32)
Calcium: 9.4 mg/dL (ref 8.9–10.3)
Chloride: 102 mmol/L (ref 98–111)
Creatinine: 1.07 mg/dL — ABNORMAL HIGH (ref 0.44–1.00)
GFR, Estimated: 57 mL/min — ABNORMAL LOW (ref >60)
Glucose, Bld: 101 mg/dL — ABNORMAL HIGH (ref 70–99)
Potassium: 4.4 mmol/L (ref 3.5–5.1)
Sodium: 141 mmol/L (ref 135–145)

## 2021-09-27 MED ORDER — IOHEXOL 300 MG/ML  SOLN
100.0000 mL | Freq: Once | INTRAMUSCULAR | Status: AC | PRN
  Administered 2021-09-27: 100 mL via INTRAVENOUS

## 2021-09-27 MED ORDER — SODIUM CHLORIDE (PF) 0.9 % IJ SOLN
INTRAMUSCULAR | Status: AC
  Filled 2021-09-27: qty 50

## 2021-09-29 ENCOUNTER — Telehealth: Payer: Self-pay | Admitting: Gynecologic Oncology

## 2021-09-29 NOTE — Telephone Encounter (Signed)
Spoke with patient about CT - despite increase in her CA-125, para-aortic disease has decreased mildly in size and there is no new evidence of metastatic disease. Spoke with option of continuing Tamoxifen, changing back to letrozole (she had miserable side effects on the AI). I recommend that we continue Tamoxifen, since well tolerated, and plan to repeat CA-125 and CT in 3 months. She may have had a bump in CA-125 unrelated to cancer.  ? ?Jeral Pinch MD ?Gynecologic Oncology ? ?

## 2021-09-30 ENCOUNTER — Other Ambulatory Visit: Payer: Self-pay | Admitting: Gynecologic Oncology

## 2021-09-30 DIAGNOSIS — C569 Malignant neoplasm of unspecified ovary: Secondary | ICD-10-CM

## 2021-11-04 ENCOUNTER — Other Ambulatory Visit: Payer: Medicare Other

## 2021-11-08 ENCOUNTER — Other Ambulatory Visit (HOSPITAL_COMMUNITY): Payer: Medicare Other

## 2021-11-10 ENCOUNTER — Inpatient Hospital Stay: Payer: Medicare Other | Admitting: Gynecologic Oncology

## 2021-11-10 ENCOUNTER — Telehealth: Payer: Self-pay | Admitting: *Deleted

## 2021-11-10 NOTE — Telephone Encounter (Signed)
Per Dr Berline Lopes scheduled the patient for a lab/CT/MD visit appts. Patient given date/times for the appts and instructions for the CT   7/28 at 4 pm lab and p/u contrast  8/1 CT at 11:30 am  8/4 at 4 pm with Dr Berline Lopes

## 2021-12-08 ENCOUNTER — Other Ambulatory Visit: Payer: Self-pay | Admitting: Gynecologic Oncology

## 2021-12-08 DIAGNOSIS — C481 Malignant neoplasm of specified parts of peritoneum: Secondary | ICD-10-CM

## 2021-12-24 ENCOUNTER — Inpatient Hospital Stay: Payer: Medicare Other | Attending: Hematology and Oncology

## 2021-12-24 ENCOUNTER — Telehealth: Payer: Self-pay

## 2021-12-24 ENCOUNTER — Other Ambulatory Visit: Payer: Self-pay

## 2021-12-24 DIAGNOSIS — C482 Malignant neoplasm of peritoneum, unspecified: Secondary | ICD-10-CM | POA: Diagnosis not present

## 2021-12-24 DIAGNOSIS — C481 Malignant neoplasm of specified parts of peritoneum: Secondary | ICD-10-CM

## 2021-12-24 LAB — BASIC METABOLIC PANEL
Anion gap: 5 (ref 5–15)
BUN: 14 mg/dL (ref 8–23)
CO2: 31 mmol/L (ref 22–32)
Calcium: 9.3 mg/dL (ref 8.9–10.3)
Chloride: 106 mmol/L (ref 98–111)
Creatinine, Ser: 1.01 mg/dL — ABNORMAL HIGH (ref 0.44–1.00)
GFR, Estimated: >60 mL/min (ref >60)
Glucose, Bld: 106 mg/dL — ABNORMAL HIGH (ref 70–99)
Potassium: 4.1 mmol/L (ref 3.5–5.1)
Sodium: 142 mmol/L (ref 135–145)

## 2021-12-24 NOTE — Telephone Encounter (Signed)
Spoke with patient and reviewed BMP results. Per Joylene John, NP kidney function is stable (1.01) and slightly improved from her last BMP. Patient verbalized understanding.  Patient inquiring if her appointment with Dr. Berline Lopes on 12/31/21 can be switched to a phone visit. Per Dr. Berline Lopes, I recommend an in-person visit but we can start with a phone visit if needed. Patient verbalized understanding and states "lets leave it as an in-person visit for now. If we end up going to the beach I'll call and change it."

## 2021-12-28 ENCOUNTER — Ambulatory Visit (HOSPITAL_COMMUNITY)
Admission: RE | Admit: 2021-12-28 | Discharge: 2021-12-28 | Disposition: A | Discharge status: Home / Self Care | Payer: Medicare Other | Source: Ambulatory Visit | Attending: Gynecologic Oncology | Admitting: Gynecologic Oncology

## 2021-12-28 DIAGNOSIS — I700 Atherosclerosis of aorta: Secondary | ICD-10-CM | POA: Insufficient documentation

## 2021-12-28 DIAGNOSIS — C569 Malignant neoplasm of unspecified ovary: Secondary | ICD-10-CM | POA: Insufficient documentation

## 2021-12-28 DIAGNOSIS — R590 Localized enlarged lymph nodes: Secondary | ICD-10-CM | POA: Diagnosis not present

## 2021-12-28 MED ORDER — IOHEXOL 300 MG/ML  SOLN
100.0000 mL | Freq: Once | INTRAMUSCULAR | Status: AC | PRN
  Administered 2021-12-28: 100 mL via INTRAVENOUS

## 2021-12-28 MED ORDER — IOHEXOL 9 MG/ML PO SOLN: 1000.0000 mL | Freq: Once | ORAL | Status: DC

## 2021-12-30 ENCOUNTER — Encounter: Payer: Self-pay | Admitting: Gynecologic Oncology

## 2021-12-31 ENCOUNTER — Other Ambulatory Visit: Payer: Self-pay

## 2021-12-31 ENCOUNTER — Inpatient Hospital Stay: Payer: Medicare Other | Attending: Hematology and Oncology

## 2021-12-31 ENCOUNTER — Other Ambulatory Visit: Payer: Self-pay | Admitting: Gynecologic Oncology

## 2021-12-31 ENCOUNTER — Inpatient Hospital Stay (HOSPITAL_BASED_OUTPATIENT_CLINIC_OR_DEPARTMENT_OTHER): Payer: Medicare Other | Admitting: Gynecologic Oncology

## 2021-12-31 ENCOUNTER — Encounter: Payer: Self-pay | Admitting: Gynecologic Oncology

## 2021-12-31 VITALS — BP 143/77 | HR 89 | Temp 97.2°F | Resp 17 | Wt 144.2 lb

## 2021-12-31 DIAGNOSIS — R5383 Other fatigue: Secondary | ICD-10-CM | POA: Diagnosis not present

## 2021-12-31 DIAGNOSIS — C482 Malignant neoplasm of peritoneum, unspecified: Secondary | ICD-10-CM | POA: Insufficient documentation

## 2021-12-31 DIAGNOSIS — Z90722 Acquired absence of ovaries, bilateral: Secondary | ICD-10-CM | POA: Insufficient documentation

## 2021-12-31 DIAGNOSIS — C569 Malignant neoplasm of unspecified ovary: Secondary | ICD-10-CM

## 2021-12-31 DIAGNOSIS — Z79810 Long term (current) use of selective estrogen receptor modulators (SERMs): Secondary | ICD-10-CM | POA: Diagnosis not present

## 2021-12-31 DIAGNOSIS — Z79899 Other long term (current) drug therapy: Secondary | ICD-10-CM | POA: Diagnosis not present

## 2021-12-31 DIAGNOSIS — Z90710 Acquired absence of both cervix and uterus: Secondary | ICD-10-CM | POA: Diagnosis not present

## 2021-12-31 DIAGNOSIS — N952 Postmenopausal atrophic vaginitis: Secondary | ICD-10-CM

## 2021-12-31 DIAGNOSIS — Z9079 Acquired absence of other genital organ(s): Secondary | ICD-10-CM | POA: Diagnosis not present

## 2021-12-31 DIAGNOSIS — C481 Malignant neoplasm of specified parts of peritoneum: Secondary | ICD-10-CM

## 2021-12-31 DIAGNOSIS — R5382 Chronic fatigue, unspecified: Secondary | ICD-10-CM

## 2021-12-31 LAB — CBC (CANCER CENTER ONLY)
HCT: 37.1 % (ref 36.0–46.0)
Hemoglobin: 12.7 g/dL (ref 12.0–15.0)
MCH: 31.8 pg (ref 26.0–34.0)
MCHC: 34.2 g/dL (ref 30.0–36.0)
MCV: 92.8 fL (ref 80.0–100.0)
Platelet Count: 186 10*3/uL (ref 150–400)
RBC: 4 MIL/uL (ref 3.87–5.11)
RDW: 12.4 % (ref 11.5–15.5)
WBC Count: 7.4 10*3/uL (ref 4.0–10.5)
nRBC: 0 % (ref 0.0–0.2)

## 2021-12-31 NOTE — Progress Notes (Signed)
Gynecologic Oncology Return Clinic Visit  12/31/2021  Reason for Visit: Surveillance visit in the setting of low-grade serous carcinoma  Treatment History: Oncology History Overview Note  Low grade serous, ER positive [facility] done on 8/9: HRD not detected, MSI stable, low tumor mutational burden 1 Muts/Mb, no alterations of BRCA1 & 2   Extraovarian primary peritoneal carcinoma ([place])  09/29/2020 Imaging   MRI lumbar spine The dominant finding in this case is that of massive retroperitoneal lymphadenopathy, likely related to either metastatic disease or lymphoma.   Chronic degenerative and postoperative findings of the spine as outlined in detail above   10/05/2020 Imaging   IMPRESSION outside CT 1. Large calcified retroperitoneal lymph nodes highly concerning for metastatic disease, likely related to primary carcinoma of the colon or lymphoma. Clinical correlation is recommended. 2. No bowel obstruction. Normal appendix. 3. Stable right hepatic dome hemangioma.   10/30/2020 PET scan   1. The densely calcified retroperitoneal adenopathy is highly hypermetabolic with maximum SUV of 23.4 (Deauville 5) there is also Deauville 3 activity in a small right axillary lymph node. Given the lack of an obvious primary outside of the retroperitoneum, appearance tends to favor lymphoma or Castleman's disease. Tissue diagnosis suggested. 2. Other imaging findings of potential clinical significance: Chronic left maxillary sinusitis. Aortic Atherosclerosis (ICD10-I70.0). Coronary atherosclerosis.   11/10/2020 Procedure   CT guided biopsy of retroperitoneal adenopathy   11/10/2020 Pathology Results   biopsy results which favor a low-grade serous carcinoma of gyn origin. This is a somewhat atypical presentation, but my suspicion is that it is primary peritoneal carcinoma   11/16/2020 Tumor Marker   Patient's tumor was tested for the following markers: CA-125. Results of the tumor marker test revealed  96.4.   12/01/2020 Pathology Results   SURGICAL PATHOLOGY  CASE: [phone]  PATIENT: Taima O'DONNELL  Surgical Pathology Report   Clinical History: Metastatic low grade serous carcinoma of gyn origin  ([facility])    FINAL MICROSCOPIC DIAGNOSIS:   A. UTERUS, CERVIX, BILATERAL FALLOPIAN TUBES AND OVARIES:  Uterus:  -  Inactive endometrium  -  Leiomyoma (1.1 cm; largest)  -  Calcified nodule of the broad ligament with psammoma bodies (right)  -  No hyperplasia or malignancy identified   Cervix:  -  Benign cervix  -  No dysplasia or malignancy identified   Ovaries, bilateral:  -  Serous cyst adenofibroma with microscopic focus of borderline change  (right)    Fallopian tubes, bilateral:  -  Benign fallopian tube with paratubal cyst (right)  -  No malignancy identified   B. OMENTUM, OMENTECTOMY:  -  No carcinoma identified   C. PERIAORTIC TUMOR, EXCISION:  -  Serous carcinoma, low-grade, with psammoma bodies  -  See comment below   COMMENT:   C.  At the request of the treating clinician, immunohistochemistry was repeated.  The neoplastic cells are positive for ER, cytokeratin 7, cytokeratin 5/6, p53, PAX8 and WT1 but negative for cytokeratin 20,  TTF-1, PR and thyroglobulin.  The morphology and immunophenotype are consistent with a primary peritoneal low-grade serous carcinoma (see also, [id number]).   ONCOLOGY TABLE:   OVARY or FALLOPIAN TUBE or PRIMARY PERITONEUM: Resection   Procedure: Total hysterectomy and bilateral salpingo-oophorectomy with omentectomy and periaortic tumor excision  Specimen Integrity: Intact  Tumor Site: Primary peritoneum (see comment)  Tumor Size: See comment  Histologic Type: Low-grade serous carcinoma  Histologic Grade: Low-grade  Ovarian Surface Involvement: Not identified  Fallopian Tube Surface Involvement: Not identified  Implants (required  for advanced stage serous/seromucinous borderline  tumors only): Not applicable  Other Tissue/  Organ Involvement: Periaortic tumor  Largest Extrapelvic Peritoneal Focus: 5.5 cm (aggregate measurement)  Peritoneal/Ascitic Fluid Involvement: Not applicable  Chemotherapy Response Score (CRS): Not applicable, no known presurgical therapy  Regional Lymph Nodes: Not applicable (no lymph nodes submitted or found)  Distant Metastasis:       Distant Site(s) Involved: Not applicable  Pathologic Stage Classification (pTNM, AJCC 8th Edition): pT3, pN not assigned  Ancillary Studies: Can be performed upon request  Representative Tumor Block: C1  Comment(s): The only focus of low-grade serous carcinoma identified in the resection specimen is the tissue submitted as periaortic tumor (part C).  Clinically, this was favored to represent matted lymph nodes; however, there is no evidence of nodal tissue in the examined material. This case was discussed with Dr. Berline Lopes and given the extent of the matted lymph nodes, this is being staged as a pT3.     12/01/2020 Surgery   Pre-operative Diagnosis: low grade serous carcinoma with significant retroperitoneal adenopathy, no other obvious imaging evidence of disease   Post-operative Diagnosis: same, significantly adherent presacral and para-aortic lymphadenopathy   Operation: Robotic-assisted laparoscopic total hysterectomy with bilateral salpingo-oophorectomy, laparotomy, infra-colic omentectomy, portion of necrotic tumor-ridden retroperitoneal lymph nodes, oversew of sigmoid serosa   Surgeon: Jeral Pinch MD   Assistant Surgeon: Lahoma Crocker MD (an MD assistant was necessary for tissue manipulation, management of robotic instrumentation, retraction and positioning due to the complexity of the case and hospital policies).    Intra-operative consultant:  Everitt Amber MD    Operative Findings: On EUA, small mobile uterus. ON intra-abdominal entry, normal liver and diaphragm. One filmy adhesion noted between the right liver lobe and the anterior  abdominal wall. The spleen was somewhat lateral and appeared mildly enlarged. There were some adhesions of the infragastric omentum to the anterior abdominal wall in the midline. The omentum was normal appearing without obvious evidence of tumor. Small and large bowel were normal appearing, appendix normal. Uterus 6cm and normal in appearance. Somewhat atrophic bilateral adnexa. There were filmy adhesions between each adnexa and the pelvic sidewall. Some filmy adhesions were also noted between the posterior uterus and rectum. Approximately 0.5-1cm white nodule within the broad ligament, lateral to the right cornua, c/w possible fibroma vs fibroid. No pelvic adenopathy. Significantly enlarged, necrotic, and tumor-filled lymph node conglomeration was noted starting within the presacral space (adherent to the sacral promontory) and extending superiorly along the great vessels to the level of the renal vessel (suspected). Given adherence of the involved lymph nodes, I could not visualize this anatomy, I could long palpate along each lateral aspect of the aorta. There was no identifiable plane between matted lymph nodes and underlying aorta or IVC. Given burden of disease and my concern that resection was not feasible, I asked my senior partner (Dr. Denman George) to evaluate intra-op. She agreed that moving forward with attempt at debulking involved lymph nodes was likely not feasible and an attempt at this time would be unsafe.   There was no obvious peritoneal disease and no clear involvement of gyn organs. Despite prior biopsy, findings at surgery remain concerning for a non-gynecologic process. If this is confirmed to be low-grade serous carcinoma of gyn origin, this was an R2 resection.    12/15/2020 Initial Diagnosis   Extraovarian primary peritoneal carcinoma ([place])   12/15/2020 Cancer Staging   Staging form: [place], AJCC 7th Edition - Pathologic stage from 12/15/2020: Stage IIIC (T3c, N0,  cM0) - Signed by Heath Lark, MD on 12/15/2020 Staged by: Managing physician Stage prefix: Initial diagnosis   01/04/2021 Procedure   Successful placement of a power injectable Port-A-Cath via the right internal jugular vein. The catheter is ready for immediate use.   01/08/2021 - 02/19/2021 Chemotherapy    Patient is on Treatment Plan: OVARIAN CARBOPLATIN (AUC 6) / PACLITAXEL (175) Q21D X 6 CYCLES       01/29/2021 Tumor Marker   Patient's tumor was tested for the following markers: CA-125. Results of the tumor marker test revealed 59.   02/05/2021 Imaging   Right:  No evidence of superficial vein thrombosis in the upper extremity.  Findings  consistent with acute deep vein thrombosis involving the right internal jugular vein.     Left:  No evidence of thrombosis in the subclavian.       03/10/2021 Tumor Marker   Patient's tumor was tested for the following markers: CA-125. Results of the tumor marker test revealed 81.8.   03/11/2021 Imaging   Stable bulky calcified retroperitoneal lymphadenopathy, consistent with metastatic disease.   Stable right hepatic lobe lesion, likely representing a benign hemangioma. This shoeds no FDG uptake on previous PET-CT scan.   No evidence of new or progressive disease within the abdomen or pelvis.   Aortic Atherosclerosis (ICD10-I70.0).   04/06/2021 Procedure   Successful removal of implanted Port-A-Cath.   04/29/2021 Tumor Marker   Patient's tumor was tested for the following markers: CA-125. Results of the tumor marker test revealed 29.4.   05/22/2021 Genetic Testing   Negative hereditary cancer genetic testing: no pathogenic variants detected in [facility] Multi-Cancer +RNA Panel.  The report date is 05/22/2021.   The Multi-Cancer + RNA Panel offered by [facility] includes sequencing and/or deletion/duplication analysis of the following 84 genes:  AIP*, ALK, APC*, ATM*, AXIN2*, BAP1*, BARD1*, BLM*, BMPR1A*, BRCA1*, BRCA2*, BRIP1*, CASR, CDC73*, CDH1*, CDK4, CDKN1B*,  CDKN1C*, CDKN2A, CEBPA, CHEK2*, CTNNA1*, DICER1*, DIS3L2*, EGFR, EPCAM, FH*, FLCN*, GATA2*, GPC3, GREM1, HOXB13, HRAS, KIT, MAX*, MEN1*, MET, MITF, MLH1*, MSH2*, MSH3*, MSH6*, MUTYH*, NBN*, NF1*, NF2*, NTHL1*, PALB2*, PDGFRA, PHOX2B, PMS2*, POLD1*, POLE*, POT1*, PRKAR1A*, PTCH1*, PTEN*, RAD50*, RAD51C*, RAD51D*, RB1*, RECQL4, RET, RUNX1*, SDHA*, SDHAF2*, SDHB*, SDHC*, SDHD*, SMAD4*, SMARCA4*, SMARCB1*, SMARCE1*, STK11*, SUFU*, TERC, TERT, TMEM127*, Tp53*, TSC1*, TSC2*, VHL*, WRN*, and WT1.  RNA analysis is performed for * genes.   07/08/2021 Tumor Marker   Patient's tumor was tested for the following markers: CA-125. Results of the tumor marker test revealed 16.    CA-125 on 09/13/21: 56.1  Interval History: Patient reports overall doing well.  She continues to have some fatigue but overall is doing most of the activity that she wants and needs to do.  She is still caring for her mother.  Her hot flashes have improved some, most bothersome still at night.  She has less diarrhea after starting a different formulation of calcium, but now sometimes struggles with constipation.  She denies any abdominal or pelvic pain.  She has some fecal urgency.  She endorses some intermittent mild bloating.  She has noted some increased urinary frequency, denies any dysuria, hematuria, or other urinary symptoms.  She denies any vaginal bleeding or discharge.  She has occasional very light spotting with intercourse, lubricant has helped tremendously.  Past Medical/Surgical History: Past Medical History:  Diagnosis Date   Arthritis    Chronic pain    Contact lens/glasses fitting    wears contacts or glasses   Depression    Family history of  melanoma 04/29/2021   Fibromyalgia    Melanoma ([place])    Peritoneal carcinoma ([place])    PONV (postoperative nausea and vomiting)    prolonged sedation very hard to wake    Past Surgical History:  Procedure Laterality Date   BACK SURGERY     lumb lam   BREAST BIOPSY Right     2021   BREAST BIOPSY Left    BREAST ENHANCEMENT SURGERY  05/30/1980   implants    BREAST IMPLANT EXCHANGE  05/30/2009   CARPAL TUNNEL RELEASE  05/30/2001   rt and lt   CARPOMETACARPEL SUSPENSION PLASTY  06/22/2012   Procedure: CARPOMETACARPEL ([place]) SUSPENSION PLASTY;  Surgeon: Alta Corning, MD;  Location: [facility];  Service: Orthopedics;  Laterality: Left;  burton's interposition arthroplasty left thumb    CATARACT EXTRACTION W/ INTRAOCULAR LENS  IMPLANT, BILATERAL     COLONOSCOPY     x2   DIAGNOSTIC LAPAROSCOPY  05/30/2000   expl lap   DILATION AND CURETTAGE OF UTERUS  05/30/2001   ablation   ELBOW SURGERY  05/31/1999   decompression rt   EPIDURAL BLOCK INJECTION     multiple   IR IMAGING GUIDED PORT INSERTION  01/04/2021   IR REMOVAL TUN ACCESS W/ PORT W/O FL MOD SED  04/06/2021   LYMPH NODE DISSECTION N/A 12/01/2020   Procedure: LYMPH NODE DISSECTION;  Surgeon: Lafonda Mosses, MD;  Location: [facility];  Service: Gynecology;  Laterality: N/A;   SKIN CANCER EXCISION     melanoma, from abdomen   TONSILLECTOMY      Family History  Problem Relation Age of Onset   Melanoma Mother        behind ear; on leg; dx after 64   Basal cell carcinoma Mother        face   Lung cancer Father 49       smoking hx   Leukemia Maternal Grandmother        dx after 13   Lung cancer Cousin        maternal female cousin; dx after 48; smoking hx   Cancer Cousin        paternal female cousin; unknown type; dx after 25   Colon cancer Neg Hx    Breast cancer Neg Hx    Ovarian cancer Neg Hx    Endometrial cancer Neg Hx    Pancreatic cancer Neg Hx    Prostate cancer Neg Hx     Social History   Socioeconomic History   Marital status: Married    Spouse name: Not on file   Number of children: Not on file   Years of education: Not on file   Highest education level: Not on file  Occupational History   Not on file  Tobacco Use   Smoking status: Never   Smokeless tobacco:  Never  Vaping Use   Vaping Use: Never used  Substance and Sexual Activity   Alcohol use: Yes    Comment: rare   Drug use: No   Sexual activity: Yes    Partners: Male    Birth control/protection: Post-menopausal  Other Topics Concern   Not on file  Social History Narrative   Not on file   Social Determinants of Health   Financial Resource Strain: Not on file  Food Insecurity: Not on file  Transportation Needs: Not on file  Physical Activity: Not on file  Stress: Not on file  Social Connections: Not on file    Current  Medications:  Current Outpatient Medications:    Apoaequorin (PREVAGEN PO), Take by mouth daily., Disp: , Rfl:    cholecalciferol (VITAMIN D3) 25 MCG (1000 UNIT) tablet, Take 1,000 Units by mouth daily., Disp: , Rfl:    estradiol (ESTRACE VAGINAL) 0.1 MG/GM vaginal cream, Place 1 Applicatorful vaginally 3 (three) times a week., Disp: 42.5 g, Rfl: 12   FLUoxetine (PROZAC) 20 MG capsule, Take 20 mg by mouth every morning., Disp: , Rfl:    Misc Natural Products (ELDERBERRY IMMUNE COMPLEX PO), Take 5 mLs by mouth daily., Disp: , Rfl:    Omega-3 Fatty Acids (FISH OIL BURP-LESS) 1000 MG CAPS, Take by mouth., Disp: , Rfl:    tamoxifen (NOLVADEX) 20 MG tablet, TAKE 1 TABLET BY MOUTH 2 TIMES DAILY., Disp: 60 tablet, Rfl: 3   traZODone (DESYREL) 25 mg TABS tablet, Take 25 mg by mouth at bedtime., Disp: , Rfl:    famotidine (PEPCID) 20 MG tablet, Take 1 tablet (20 mg total) by mouth daily. Can take a second dose as needed daily, Disp: 40 tablet, Rfl: 3  Review of Systems: + Improved appetite, fatigue, weight gain, diarrhea, dyspareunia, urinary frequency, hot flashes, joint pain, back pain, cramps, dizziness, depression, decreased concentration. Denies fevers, chills. Denies hearing loss, neck lumps or masses, mouth sores, ringing in ears or voice changes. Denies cough or wheezing.  Denies shortness of breath. Denies chest pain or palpitations. Denies leg swelling. Denies  abdominal distention, pain, blood in stools, constipation, nausea, vomiting, or early satiety. Denies dysuria, hematuria or incontinence. Denies pelvic pain, vaginal bleeding or vaginal discharge.   Denies itching, rash, or wounds. Denies headaches, numbness or seizures. Denies swollen lymph nodes or glands, denies easy bruising or bleeding. Denies anxiety, confusion.  Physical Exam: BP (!) 143/77 (BP Location: Left Arm, Patient Position: Sitting)   Pulse 89   Temp (!) 97.2 F (36.2 C) (Tympanic)   Resp 17   Wt 144 lb 4 oz (65.4 kg)   SpO2 98%   BMI 25.56 kg/m  General: Alert, oriented, no acute distress. HEENT: Normocephalic, atraumatic, sclera anicteric. Chest: Clear to auscultation bilaterally.  No wheezes or rhonchi. Cardiovascular: Regular rate and rhythm, no murmurs. Abdomen: soft, nontender.  Normoactive bowel sounds.  No masses or hepatosplenomegaly appreciated.  Well-healed incisions. Extremities: Grossly normal range of motion.  Warm, well perfused.  No edema bilaterally. Skin: No rashes or lesions noted. Lymphatics: No cervical, supraclavicular, or inguinal adenopathy. GU: Normal appearing external genitalia without erythema, excoriation, or lesions.  Speculum exam reveals mildly atrophic vaginal mucosa, no lesions or masses noted.  Cuff intact.  Bimanual exam reveals no masses or nodularity.  Rectovaginal exam confirms the findings.  Laboratory & Radiologic Studies: CBC    Component Value Date/Time   WBC 7.4 12/31/2021 1536   WBC 8.1 12/03/2020 0800   RBC 4.00 12/31/2021 1536   HGB 12.7 12/31/2021 1536   HCT 37.1 12/31/2021 1536   PLT 186 12/31/2021 1536   MCV 92.8 12/31/2021 1536   MCH 31.8 12/31/2021 1536   MCHC 34.2 12/31/2021 1536   RDW 12.4 12/31/2021 1536   LYMPHSABS 1.7 03/10/2021 0938   MONOABS 0.6 03/10/2021 0938   EOSABS 0.1 03/10/2021 0938   BASOSABS 0.0 03/10/2021 0938   CA-125 is pending  CT A/P on 8/1: IMPRESSION: 1. Calcified abdominal  adenopathy is minimally decreased in size since 09/28/2021, most consistent with response to therapy. 2. No new or progressive disease. 3. Similar borderline to mild common duct dilatation, without cause identified.  This could be correlated with bilirubin level. 4.  Aortic Atherosclerosis (ICD10-I70.0).  Assessment & Plan: Charvi Gammage is a 66 y.o. woman with Stage IIIC presumed primary peritoneal carcinoma, low-grade.   Patient is doing well and looks and feels better than she has since I met her.  She is tolerating tamoxifen with reduction in side effects.    She is very happy with the news regarding CT scan which shows stable to mild decrease in retroperitoneal adenopathy that we have been following, no new disease.  I will contact her with CA-125 results once back.  We will plan for surveillance visit in 3 months with blood work at that time.  Depending on how she is doing and her tumor marker, we will discuss repeat CT scan in 3 months versus 6.  Given her fatigue, CBC was ordered today which shows no anemia.  24 minutes of total time was spent for this patient encounter, including preparation, face-to-face counseling with the patient and coordination of care, and documentation of the encounter.  Jeral Pinch, MD  Division of Gynecologic Oncology  Department of Obstetrics and Gynecology  [facility]

## 2021-12-31 NOTE — Patient Instructions (Signed)
It was wonderful to see you today.  I am glad that you are continuing to feel better.  I will release your tumor marker to you when it results.  I will plan to see you back in 3 months.  Depending on how you are feeling and your tumor marker at that time, we will discuss whether repeat CT scan needs to be done in 3 months or whether we will wait for another scan until 6 months after this recent one.  If you develop any new and concerning symptoms before then, please call to see me sooner.

## 2022-01-02 LAB — CA 125: Cancer Antigen (CA) 125: 19 U/mL (ref 0.0–38.1)

## 2022-01-03 ENCOUNTER — Telehealth: Payer: Self-pay | Admitting: *Deleted

## 2022-01-03 NOTE — Telephone Encounter (Signed)
Spoke with [name] [name] this morning and informed her that per Dr. Berline Lopes [name] [name]'s CA 125 is back to normal. It's 19. She verbalized understanding and did not have any questions.

## 2022-03-24 ENCOUNTER — Telehealth: Payer: Self-pay

## 2022-03-24 NOTE — Telephone Encounter (Signed)
Left message for patient to see about rescheduling her appointment on 11/3 from 3:45pm to 10:15am.

## 2022-04-01 ENCOUNTER — Inpatient Hospital Stay: Payer: Medicare Other

## 2022-04-01 ENCOUNTER — Inpatient Hospital Stay: Payer: Medicare Other | Attending: Hematology and Oncology | Admitting: Gynecologic Oncology

## 2022-04-01 ENCOUNTER — Encounter: Payer: Self-pay | Admitting: Gynecologic Oncology

## 2022-04-01 VITALS — BP 139/64 | HR 62 | Temp 98.4°F | Resp 18 | Ht 63.58 in | Wt 147.4 lb

## 2022-04-01 DIAGNOSIS — C482 Malignant neoplasm of peritoneum, unspecified: Secondary | ICD-10-CM | POA: Diagnosis not present

## 2022-04-01 DIAGNOSIS — Z90722 Acquired absence of ovaries, bilateral: Secondary | ICD-10-CM | POA: Insufficient documentation

## 2022-04-01 DIAGNOSIS — M2550 Pain in unspecified joint: Secondary | ICD-10-CM | POA: Diagnosis not present

## 2022-04-01 DIAGNOSIS — Z9079 Acquired absence of other genital organ(s): Secondary | ICD-10-CM | POA: Diagnosis not present

## 2022-04-01 DIAGNOSIS — Z79899 Other long term (current) drug therapy: Secondary | ICD-10-CM | POA: Insufficient documentation

## 2022-04-01 DIAGNOSIS — C481 Malignant neoplasm of specified parts of peritoneum: Secondary | ICD-10-CM

## 2022-04-01 DIAGNOSIS — Z90710 Acquired absence of both cervix and uterus: Secondary | ICD-10-CM | POA: Diagnosis not present

## 2022-04-01 DIAGNOSIS — Z79810 Long term (current) use of selective estrogen receptor modulators (SERMs): Secondary | ICD-10-CM | POA: Insufficient documentation

## 2022-04-01 DIAGNOSIS — N952 Postmenopausal atrophic vaginitis: Secondary | ICD-10-CM

## 2022-04-01 DIAGNOSIS — Z800 Family history of malignant neoplasm of digestive organs: Secondary | ICD-10-CM | POA: Diagnosis not present

## 2022-04-01 DIAGNOSIS — Z801 Family history of malignant neoplasm of trachea, bronchus and lung: Secondary | ICD-10-CM | POA: Diagnosis not present

## 2022-04-01 NOTE — Patient Instructions (Signed)
It was good to see you today.  I do not see or feel any evidence of cancer on your exam.  I will let you know when I get your CA125 back.  As long as it is normal, we will hold off getting a scan until I see you back in 3 months.  Please call me if you develop any new and concerning symptoms before your next visit.

## 2022-04-01 NOTE — Progress Notes (Signed)
Gynecologic Oncology Return Clinic Visit  04/01/22  Reason for Visit: Surveillance visit in the setting of low-grade serous carcinoma   Treatment History: Oncology History Overview Note  Low grade serous, ER positive [facility] done on 8/9: HRD not detected, MSI stable, low tumor mutational burden 1 Muts/Mb, no alterations of BRCA1 & 2   Extraovarian primary peritoneal carcinoma ([place])  09/29/2020 Imaging   MRI lumbar spine The dominant finding in this case is that of massive retroperitoneal lymphadenopathy, likely related to either metastatic disease or lymphoma.   Chronic degenerative and postoperative findings of the spine as outlined in detail above   10/05/2020 Imaging   IMPRESSION outside CT 1. Large calcified retroperitoneal lymph nodes highly concerning for metastatic disease, likely related to primary carcinoma of the colon or lymphoma. Clinical correlation is recommended. 2. No bowel obstruction. Normal appendix. 3. Stable right hepatic dome hemangioma.   10/30/2020 PET scan   1. The densely calcified retroperitoneal adenopathy is highly hypermetabolic with maximum SUV of 23.4 (Deauville 5) there is also Deauville 3 activity in a small right axillary lymph node. Given the lack of an obvious primary outside of the retroperitoneum, appearance tends to favor lymphoma or Castleman's disease. Tissue diagnosis suggested. 2. Other imaging findings of potential clinical significance: Chronic left maxillary sinusitis. Aortic Atherosclerosis (ICD10-I70.0). Coronary atherosclerosis.   11/10/2020 Procedure   CT guided biopsy of retroperitoneal adenopathy   11/10/2020 Pathology Results   biopsy results which favor a low-grade serous carcinoma of gyn origin. This is a somewhat atypical presentation, but my suspicion is that it is primary peritoneal carcinoma   11/16/2020 Tumor Marker   Patient's tumor was tested for the following markers: CA-125. Results of the tumor marker test revealed  96.4.   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 UTERUS, CERVIX, BILATERAL FALLOPIAN TUBES AND OVARIES:  Uterus:  -  Inactive endometrium  -  Leiomyoma (1.1 cm; largest)  -  Calcified nodule of the broad ligament with psammoma bodies (right)  -  No hyperplasia or malignancy identified   Cervix:  -  Benign cervix  -  No dysplasia or malignancy identified   Ovaries, bilateral:  -  Serous cyst adenofibroma with microscopic focus of borderline change  (right)    Fallopian tubes, bilateral:  -  Benign fallopian tube with paratubal cyst (right)  -  No malignancy identified   B. OMENTUM, OMENTECTOMY:  -  No carcinoma identified   C. PERIAORTIC TUMOR, EXCISION:  -  Serous carcinoma, low-grade, with psammoma bodies  -  See comment below   COMMENT:   C.  At the request of the treating clinician, immunohistochemistry was repeated.  The neoplastic cells are positive for ER, cytokeratin 7, cytokeratin 5/6, p53, PAX8 and WT1 but negative for cytokeratin 20,  TTF-1, PR and thyroglobulin.  The morphology and immunophenotype are consistent with a primary peritoneal low-grade serous carcinoma (see also, [id number]).   ONCOLOGY TABLE:   OVARY or FALLOPIAN TUBE or PRIMARY PERITONEUM: Resection   Procedure: Total hysterectomy and bilateral salpingo-oophorectomy with omentectomy and periaortic tumor excision  Specimen Integrity: Intact  Tumor Site: Primary peritoneum (see comment)  Tumor Size: See comment  Histologic Type: Low-grade serous carcinoma  Histologic Grade: Low-grade  Ovarian Surface Involvement: Not identified  Fallopian Tube Surface Involvement: Not identified  Implants (  required for advanced stage serous/seromucinous borderline  tumors only): Not applicable  Other Tissue/  Organ Involvement: Periaortic tumor  Largest Extrapelvic Peritoneal Focus: 5.5 cm (aggregate measurement)  Peritoneal/Ascitic Fluid Involvement: Not applicable  Chemotherapy Response Score (CRS): Not applicable, no known presurgical therapy  Regional Lymph Nodes: Not applicable (no lymph nodes submitted or found)  Distant Metastasis:       Distant Site(s) Involved: Not applicable  Pathologic Stage Classification (pTNM, AJCC 8th Edition): pT3, pN not assigned  Ancillary Studies: Can be performed upon request  Representative Tumor Block: C1  Comment(s): The only focus of low-grade serous carcinoma identified in the resection specimen is the tissue submitted as periaortic tumor (part C).  Clinically, this was favored to represent matted lymph nodes; however, there is no evidence of nodal tissue in the examined material. This case was discussed with Dr. Berline Lopes and given the extent of the matted lymph nodes, this is being staged as a pT3.     12/01/2020 Surgery   Pre-operative Diagnosis: low grade serous carcinoma with significant retroperitoneal adenopathy, no other obvious imaging evidence of disease   Post-operative Diagnosis: same, significantly adherent presacral and para-aortic lymphadenopathy   Operation: Robotic-assisted laparoscopic total hysterectomy with bilateral salpingo-oophorectomy, laparotomy, infra-colic omentectomy, portion of necrotic tumor-ridden retroperitoneal lymph nodes, oversew of sigmoid serosa   Surgeon: Jeral Pinch MD   Assistant Surgeon: Lahoma Crocker MD (an MD assistant was necessary for tissue manipulation, management of robotic instrumentation, retraction and positioning due to the complexity of the case and hospital policies).    Intra-operative consultant:  Everitt Amber MD    Operative Findings: On EUA, small mobile uterus. ON intra-abdominal entry, normal liver and diaphragm. One filmy adhesion noted between the right liver lobe and the anterior  abdominal wall. The spleen was somewhat lateral and appeared mildly enlarged. There were some adhesions of the infragastric omentum to the anterior abdominal wall in the midline. The omentum was normal appearing without obvious evidence of tumor. Small and large bowel were normal appearing, appendix normal. Uterus 6cm and normal in appearance. Somewhat atrophic bilateral adnexa. There were filmy adhesions between each adnexa and the pelvic sidewall. Some filmy adhesions were also noted between the posterior uterus and rectum. Approximately 0.5-1cm white nodule within the broad ligament, lateral to the right cornua, c/w possible fibroma vs fibroid. No pelvic adenopathy. Significantly enlarged, necrotic, and tumor-filled lymph node conglomeration was noted starting within the presacral space (adherent to the sacral promontory) and extending superiorly along the great vessels to the level of the renal vessel (suspected). Given adherence of the involved lymph nodes, I could not visualize this anatomy, I could long palpate along each lateral aspect of the aorta. There was no identifiable plane between matted lymph nodes and underlying aorta or IVC. Given burden of disease and my concern that resection was not feasible, I asked my senior partner (Dr. Denman George) to evaluate intra-op. She agreed that moving forward with attempt at debulking involved lymph nodes was likely not feasible and an attempt at this time would be unsafe.   There was no obvious peritoneal disease and no clear involvement of gyn organs. Despite prior biopsy, findings at surgery remain concerning for a non-gynecologic process. If this is confirmed to be low-grade serous carcinoma of gyn origin, this was an R2 resection.    12/15/2020 Initial Diagnosis   Extraovarian primary peritoneal carcinoma ([place])   12/15/2020 Cancer Staging   Staging form: [place], AJCC 7th Edition - Pathologic stage from 12/15/2020: Stage IIIC (T3c,  N0, cM0) - Signed by Heath Lark, MD on 12/15/2020 Staged by: Managing physician Stage prefix: Initial diagnosis   01/04/2021 Procedure   Successful placement of a power injectable Port-A-Cath via the right internal jugular vein. The catheter is ready for immediate use.   01/08/2021 - 02/19/2021 Chemotherapy    Patient is on Treatment Plan: OVARIAN CARBOPLATIN (AUC 6) / PACLITAXEL (175) Q21D X 6 CYCLES       01/29/2021 Tumor Marker   Patient's tumor was tested for the following markers: CA-125. Results of the tumor marker test revealed 59.   02/05/2021 Imaging   Right:  No evidence of superficial vein thrombosis in the upper extremity.  Findings  consistent with acute deep vein thrombosis involving the right internal jugular vein.     Left:  No evidence of thrombosis in the subclavian.       03/10/2021 Tumor Marker   Patient's tumor was tested for the following markers: CA-125. Results of the tumor marker test revealed 81.8.   03/11/2021 Imaging   Stable bulky calcified retroperitoneal lymphadenopathy, consistent with metastatic disease.   Stable right hepatic lobe lesion, likely representing a benign hemangioma. This shoeds no FDG uptake on previous PET-CT scan.   No evidence of new or progressive disease within the abdomen or pelvis.   Aortic Atherosclerosis (ICD10-I70.0).   04/06/2021 Procedure   Successful removal of implanted Port-A-Cath.   04/29/2021 Tumor Marker   Patient's tumor was tested for the following markers: CA-125. Results of the tumor marker test revealed 29.4.   05/22/2021 Genetic Testing   Negative hereditary cancer genetic testing: no pathogenic variants detected in [facility] Multi-Cancer +RNA Panel.  The report date is 05/22/2021.   The Multi-Cancer + RNA Panel offered by [facility] includes sequencing and/or deletion/duplication analysis of the following 84 genes:  AIP*, ALK, APC*, ATM*, AXIN2*, BAP1*, BARD1*, BLM*, BMPR1A*, BRCA1*, BRCA2*, BRIP1*, CASR, CDC73*, CDH1*, CDK4, CDKN1B*,  CDKN1C*, CDKN2A, CEBPA, CHEK2*, CTNNA1*, DICER1*, DIS3L2*, EGFR, EPCAM, FH*, FLCN*, GATA2*, GPC3, GREM1, HOXB13, HRAS, KIT, MAX*, MEN1*, MET, MITF, MLH1*, MSH2*, MSH3*, MSH6*, MUTYH*, NBN*, NF1*, NF2*, NTHL1*, PALB2*, PDGFRA, PHOX2B, PMS2*, POLD1*, POLE*, POT1*, PRKAR1A*, PTCH1*, PTEN*, RAD50*, RAD51C*, RAD51D*, RB1*, RECQL4, RET, RUNX1*, SDHA*, SDHAF2*, SDHB*, SDHC*, SDHD*, SMAD4*, SMARCA4*, SMARCB1*, SMARCE1*, STK11*, SUFU*, TERC, TERT, TMEM127*, Tp53*, TSC1*, TSC2*, VHL*, WRN*, and WT1.  RNA analysis is performed for * genes.   07/08/2021 Tumor Marker   Patient's tumor was tested for the following markers: CA-125. Results of the tumor marker test revealed 16.    CA-125 on 09/13/21: 56.1  CA-125 on 12/31/21: 19  Interval History: Patient reports overall doing well.  Notes that in the last 3 or 4 weeks she is felt better than she has in a long time.  She denies any significant diarrhea or abdominal pain.  She continues to have some joint pain.  She denies any vaginal bleeding or discharge.  She denies any urinary symptoms.  Her vaginal dryness has improved significantly with vaginal estrogen.  Past Medical/Surgical History: Past Medical History:  Diagnosis Date   Arthritis    Chronic pain    Contact lens/glasses fitting    wears contacts or glasses   Depression    Family history of melanoma 04/29/2021   Fibromyalgia    Melanoma ([place])    Peritoneal carcinoma ([facility])    PONV (postoperative nausea and vomiting)    prolonged sedation very hard to wake    Past Surgical History:  Procedure Laterality Date   BACK SURGERY  lumb lam   BREAST BIOPSY Right    2021   BREAST BIOPSY Left    BREAST ENHANCEMENT SURGERY  05/30/1980   implants    BREAST IMPLANT EXCHANGE  05/30/2009   CARPAL TUNNEL RELEASE  05/30/2001   rt and lt   CARPOMETACARPEL SUSPENSION PLASTY  06/22/2012   Procedure: CARPOMETACARPEL ([place]) SUSPENSION PLASTY;  Surgeon: Alta Corning, MD;  Location: [facility];   Service: Orthopedics;  Laterality: Left;  burton's interposition arthroplasty left thumb    CATARACT EXTRACTION W/ INTRAOCULAR LENS  IMPLANT, BILATERAL     COLONOSCOPY     x2   DIAGNOSTIC LAPAROSCOPY  05/30/2000   expl lap   DILATION AND CURETTAGE OF UTERUS  05/30/2001   ablation   ELBOW SURGERY  05/31/1999   decompression rt   EPIDURAL BLOCK INJECTION     multiple   IR IMAGING GUIDED PORT INSERTION  01/04/2021   IR REMOVAL TUN ACCESS W/ PORT W/O FL MOD SED  04/06/2021   LYMPH NODE DISSECTION N/A 12/01/2020   Procedure: LYMPH NODE DISSECTION;  Surgeon: Lafonda Mosses, MD;  Location: [facility];  Service: Gynecology;  Laterality: N/A;   SKIN CANCER EXCISION     melanoma, from abdomen   TONSILLECTOMY      Family History  Problem Relation Age of Onset   Melanoma Mother        behind ear; on leg; dx after 28   Basal cell carcinoma Mother        face   Lung cancer Father 60       smoking hx   Leukemia Maternal Grandmother        dx after 28   Lung cancer Cousin        maternal female cousin; dx after 58; smoking hx   Cancer Cousin        paternal female cousin; unknown type; dx after 62   Colon cancer Neg Hx    Breast cancer Neg Hx    Ovarian cancer Neg Hx    Endometrial cancer Neg Hx    Pancreatic cancer Neg Hx    Prostate cancer Neg Hx     Social History   Socioeconomic History   Marital status: Married    Spouse name: Not on file   Number of children: Not on file   Years of education: Not on file   Highest education level: Not on file  Occupational History   Not on file  Tobacco Use   Smoking status: Never   Smokeless tobacco: Never  Vaping Use   Vaping Use: Never used  Substance and Sexual Activity   Alcohol use: Yes    Comment: rare   Drug use: No   Sexual activity: Yes    Partners: Male    Birth control/protection: Post-menopausal  Other Topics Concern   Not on file  Social History Narrative   Not on file   Social Determinants of Health   Financial  Resource Strain: Not on file  Food Insecurity: Not on file  Transportation Needs: Not on file  Physical Activity: Not on file  Stress: Not on file  Social Connections: Not on file    Current Medications:  Current Outpatient Medications:    Apoaequorin (PREVAGEN PO), Take by mouth daily., Disp: , Rfl:    cholecalciferol (VITAMIN D3) 25 MCG (1000 UNIT) tablet, Take 1,000 Units by mouth daily., Disp: , Rfl:    estradiol (ESTRACE VAGINAL) 0.1 MG/GM vaginal cream, Place 1 Applicatorful  vaginally 3 (three) times a week., Disp: 42.5 g, Rfl: 12   FLUoxetine (PROZAC) 20 MG capsule, Take 20 mg by mouth every morning., Disp: , Rfl:    Misc Natural Products (ELDERBERRY IMMUNE COMPLEX PO), Take 5 mLs by mouth daily., Disp: , Rfl:    Omega-3 Fatty Acids (FISH OIL BURP-LESS) 1000 MG CAPS, Take by mouth., Disp: , Rfl:    tamoxifen (NOLVADEX) 20 MG tablet, TAKE 1 TABLET BY MOUTH 2 TIMES DAILY., Disp: 60 tablet, Rfl: 3   traZODone (DESYREL) 25 mg TABS tablet, Take 25 mg by mouth at bedtime., Disp: , Rfl:   Review of Systems: Denies appetite changes, fevers, chills, fatigue, unexplained weight changes. Denies hearing loss, neck lumps or masses, mouth sores, ringing in ears or voice changes. Denies cough or wheezing.  Denies shortness of breath. Denies chest pain or palpitations. Denies leg swelling. Denies abdominal distention, pain, blood in stools, constipation, diarrhea, nausea, vomiting, or early satiety. Denies pain with intercourse, dysuria, frequency, hematuria or incontinence. Denies hot flashes, pelvic pain, vaginal bleeding or vaginal discharge.   Denies joint pain, back pain or muscle pain/cramps. Denies itching, rash, or wounds. Denies dizziness, headaches, numbness or seizures. Denies swollen lymph nodes or glands, denies easy bruising or bleeding. Denies anxiety, depression, confusion, or decreased concentration.  Physical Exam: BP 139/64 (BP Location: Left Arm, Patient Position:  Sitting)   Pulse 62   Temp 98.4 F (36.9 C) (Oral)   Resp 18   Ht 5' 3.58" (1.615 m)   Wt 147 lb 6.4 oz (66.9 kg)   SpO2 100%   BMI 25.63 kg/m  General: Alert, oriented, no acute distress. HEENT: Normocephalic, atraumatic, sclera anicteric. Chest: Clear to auscultation bilaterally.  No wheezes or rhonchi. Cardiovascular: Regular rate and rhythm, no murmurs. Abdomen: soft, nontender.  Normoactive bowel sounds.  No masses or hepatosplenomegaly appreciated.  Well-healed incisions. Extremities: Grossly normal range of motion.  Warm, well perfused.  No edema bilaterally. Skin: No rashes or lesions noted. Lymphatics: No cervical, supraclavicular, or inguinal adenopathy. GU: Normal appearing external genitalia without erythema, excoriation, or lesions.  Speculum exam reveals mildly atrophic vaginal mucosa, no lesions or masses noted.  Cuff intact.  Bimanual exam reveals no masses or nodularity.  Rectovaginal exam confirms the findings.  Laboratory & Radiologic Studies: CA-125 today drawn  Assessment & Plan: [name] [name] is a 66 y.o. woman with Stage IIIC presumed primary peritoneal carcinoma, low-grade.   Patient is doing well and continues to improve in terms of how she feels. She is tolerating tamoxifen very well.     Ca1 25 was drawn today.  I will contact her with these results.  If normal, we will plan to wait another 3 months before repeating imaging.   We will plan for surveillance visit in 3 months with blood work at that time.  Signs and symptoms that should prompt a phone call between visits were reviewed.  22 minutes of total time was spent for this patient encounter, including preparation, face-to-face counseling with the patient and coordination of care, and documentation of the encounter.  Jeral Pinch, MD  Division of Gynecologic Oncology  Department of Obstetrics and Gynecology  [facility]

## 2022-04-02 LAB — CA 125: Cancer Antigen (CA) 125: 14.2 U/mL (ref 0.0–38.1)

## 2022-04-04 ENCOUNTER — Telehealth: Payer: Self-pay

## 2022-04-04 NOTE — Telephone Encounter (Signed)
Pt is aware of her recent CA125 being 14.2.  She was excited because it is lower than last time.

## 2022-04-05 ENCOUNTER — Telehealth: Payer: Self-pay | Admitting: Surgery

## 2022-04-05 NOTE — Telephone Encounter (Signed)
Called patient to give her Ca125 results.

## 2022-04-25 ENCOUNTER — Telehealth: Payer: Self-pay | Admitting: *Deleted

## 2022-04-25 ENCOUNTER — Other Ambulatory Visit: Payer: Self-pay | Admitting: Gynecologic Oncology

## 2022-04-25 DIAGNOSIS — C481 Malignant neoplasm of specified parts of peritoneum: Secondary | ICD-10-CM

## 2022-04-25 MED ORDER — TAMOXIFEN CITRATE 20 MG PO TABS: 20.0000 mg | ORAL_TABLET | Freq: Two times a day (BID) | ORAL | 3 refills | Status: DC

## 2022-04-25 NOTE — Telephone Encounter (Signed)
Patient called and requested a refill on her tamoxifen, patient requested a 3 month supply. Pharmacy verified

## 2022-07-05 ENCOUNTER — Other Ambulatory Visit: Payer: Self-pay | Admitting: Gynecologic Oncology

## 2022-07-05 ENCOUNTER — Telehealth: Payer: Self-pay

## 2022-07-05 DIAGNOSIS — C569 Malignant neoplasm of unspecified ovary: Secondary | ICD-10-CM

## 2022-07-05 NOTE — Telephone Encounter (Signed)
[name] called to reschedule her appointment on Friday 2/9 stating [place]. It has been rescheduled for 2/16 @ 8:30. Pt agrees to date and time. She was enquiring if she needed to get a CT scan before her appointment? She is aware I will ask Dr. Berline Lopes and will call her to let her know.

## 2022-07-05 NOTE — Telephone Encounter (Signed)
Yes, we had discussed getting imaging at the 6 months mark. If she'd like to get it scheduled before she sees me, I'll put the order in. Otherwise, it can be scheduled for after her 2/16 visit.

## 2022-07-08 ENCOUNTER — Inpatient Hospital Stay: Payer: Medicare Other | Admitting: Gynecologic Oncology

## 2022-07-12 ENCOUNTER — Ambulatory Visit (HOSPITAL_COMMUNITY)
Admission: RE | Admit: 2022-07-12 | Discharge: 2022-07-12 | Disposition: A | Discharge status: Home / Self Care | Payer: Medicare Other | Source: Ambulatory Visit | Attending: Gynecologic Oncology | Admitting: Gynecologic Oncology

## 2022-07-12 DIAGNOSIS — R590 Localized enlarged lymph nodes: Secondary | ICD-10-CM | POA: Diagnosis not present

## 2022-07-12 DIAGNOSIS — I700 Atherosclerosis of aorta: Secondary | ICD-10-CM | POA: Diagnosis not present

## 2022-07-12 DIAGNOSIS — C569 Malignant neoplasm of unspecified ovary: Secondary | ICD-10-CM | POA: Diagnosis present

## 2022-07-12 DIAGNOSIS — R160 Hepatomegaly, not elsewhere classified: Secondary | ICD-10-CM | POA: Diagnosis not present

## 2022-07-12 MED ORDER — IOHEXOL 9 MG/ML PO SOLN
1000.0000 mL | ORAL | Status: AC
  Administered 2022-07-12: 1000 mL via ORAL

## 2022-07-12 MED ORDER — IOHEXOL 300 MG/ML  SOLN
100.0000 mL | Freq: Once | INTRAMUSCULAR | Status: AC | PRN
  Administered 2022-07-12: 100 mL via INTRAVENOUS

## 2022-07-14 NOTE — Progress Notes (Signed)
Gynecologic Oncology Return Clinic Visit  07/15/22  Reason for Visit: Surveillance visit in the setting of low-grade serous carcinoma   Treatment History: Oncology History Overview Note  Low grade serous, ER positive [facility] done on 8/9: HRD not detected, MSI stable, low tumor mutational burden 1 Muts/Mb, no alterations of BRCA1 & 2   Extraovarian primary peritoneal carcinoma ([place])  09/29/2020 Imaging   MRI lumbar spine The dominant finding in this case is that of massive retroperitoneal lymphadenopathy, likely related to either metastatic disease or lymphoma.   Chronic degenerative and postoperative findings of the spine as outlined in detail above   10/05/2020 Imaging   IMPRESSION outside CT 1. Large calcified retroperitoneal lymph nodes highly concerning for metastatic disease, likely related to primary carcinoma of the colon or lymphoma. Clinical correlation is recommended. 2. No bowel obstruction. Normal appendix. 3. Stable right hepatic dome hemangioma.   10/30/2020 PET scan   1. The densely calcified retroperitoneal adenopathy is highly hypermetabolic with maximum SUV of 23.4 (Deauville 5) there is also Deauville 3 activity in a small right axillary lymph node. Given the lack of an obvious primary outside of the retroperitoneum, appearance tends to favor lymphoma or Castleman's disease. Tissue diagnosis suggested. 2. Other imaging findings of potential clinical significance: Chronic left maxillary sinusitis. Aortic Atherosclerosis (ICD10-I70.0). Coronary atherosclerosis.   11/10/2020 Procedure   CT guided biopsy of retroperitoneal adenopathy   11/10/2020 Pathology Results   biopsy results which favor a low-grade serous carcinoma of gyn origin. This is a somewhat atypical presentation, but my suspicion is that it is primary peritoneal carcinoma   11/16/2020 Tumor Marker   Patient's tumor was tested for the following markers: CA-125. Results of the tumor marker test revealed  96.4.   12/01/2020 Pathology Results   SURGICAL PATHOLOGY  CASE: [id number]  PATIENT: [name] [name]  Surgical Pathology Report   Clinical History: Metastatic low grade serous carcinoma of gyn origin  ([facility])    FINAL MICROSCOPIC DIAGNOSIS:   A. UTERUS, CERVIX, BILATERAL FALLOPIAN TUBES AND OVARIES:  Uterus:  -  Inactive endometrium  -  Leiomyoma (1.1 cm; largest)  -  Calcified nodule of the broad ligament with psammoma bodies (right)  -  No hyperplasia or malignancy identified   Cervix:  -  Benign cervix  -  No dysplasia or malignancy identified   Ovaries, bilateral:  -  Serous cyst adenofibroma with microscopic focus of borderline change  (right)    Fallopian tubes, bilateral:  -  Benign fallopian tube with paratubal cyst (right)  -  No malignancy identified   B. OMENTUM, OMENTECTOMY:  -  No carcinoma identified   C. PERIAORTIC TUMOR, EXCISION:  -  Serous carcinoma, low-grade, with psammoma bodies  -  See comment below   COMMENT:   C.  At the request of the treating clinician, immunohistochemistry was repeated.  The neoplastic cells are positive for ER, cytokeratin 7, cytokeratin 5/6, p53, PAX8 and WT1 but negative for cytokeratin 20,  TTF-1, PR and thyroglobulin.  The morphology and immunophenotype are consistent with a primary peritoneal low-grade serous carcinoma (see also, [id number]).   ONCOLOGY TABLE:   OVARY or FALLOPIAN TUBE or PRIMARY PERITONEUM: Resection   Procedure: Total hysterectomy and bilateral salpingo-oophorectomy with omentectomy and periaortic tumor excision  Specimen Integrity: Intact  Tumor Site: Primary peritoneum (see comment)  Tumor Size: See comment  Histologic Type: Low-grade serous carcinoma  Histologic Grade: Low-grade  Ovarian Surface Involvement: Not identified  Fallopian Tube Surface Involvement: Not identified  Implants (  required for advanced stage serous/seromucinous borderline  tumors only): Not applicable  Other Tissue/  Organ Involvement: Periaortic tumor  Largest Extrapelvic Peritoneal Focus: 5.5 cm (aggregate measurement)  Peritoneal/Ascitic Fluid Involvement: Not applicable  Chemotherapy Response Score (CRS): Not applicable, no known presurgical therapy  Regional Lymph Nodes: Not applicable (no lymph nodes submitted or found)  Distant Metastasis:       Distant Site(s) Involved: Not applicable  Pathologic Stage Classification (pTNM, AJCC 8th Edition): pT3, pN not assigned  Ancillary Studies: Can be performed upon request  Representative Tumor Block: C1  Comment(s): The only focus of low-grade serous carcinoma identified in the resection specimen is the tissue submitted as periaortic tumor (part C).  Clinically, this was favored to represent matted lymph nodes; however, there is no evidence of nodal tissue in the examined material. This case was discussed with Dr. Berline Lopes and given the extent of the matted lymph nodes, this is being staged as a pT3.     12/01/2020 Surgery   Pre-operative Diagnosis: low grade serous carcinoma with significant retroperitoneal adenopathy, no other obvious imaging evidence of disease   Post-operative Diagnosis: same, significantly adherent presacral and para-aortic lymphadenopathy   Operation: Robotic-assisted laparoscopic total hysterectomy with bilateral salpingo-oophorectomy, laparotomy, infra-colic omentectomy, portion of necrotic tumor-ridden retroperitoneal lymph nodes, oversew of sigmoid serosa   Surgeon: Jeral Pinch MD   Assistant Surgeon: Lahoma Crocker MD (an MD assistant was necessary for tissue manipulation, management of robotic instrumentation, retraction and positioning due to the complexity of the case and hospital policies).    Intra-operative consultant:  Everitt Amber MD    Operative Findings: On EUA, small mobile uterus. ON intra-abdominal entry, normal liver and diaphragm. One filmy adhesion noted between the right liver lobe and the anterior  abdominal wall. The spleen was somewhat lateral and appeared mildly enlarged. There were some adhesions of the infragastric omentum to the anterior abdominal wall in the midline. The omentum was normal appearing without obvious evidence of tumor. Small and large bowel were normal appearing, appendix normal. Uterus 6cm and normal in appearance. Somewhat atrophic bilateral adnexa. There were filmy adhesions between each adnexa and the pelvic sidewall. Some filmy adhesions were also noted between the posterior uterus and rectum. Approximately 0.5-1cm white nodule within the broad ligament, lateral to the right cornua, c/w possible fibroma vs fibroid. No pelvic adenopathy. Significantly enlarged, necrotic, and tumor-filled lymph node conglomeration was noted starting within the presacral space (adherent to the sacral promontory) and extending superiorly along the great vessels to the level of the renal vessel (suspected). Given adherence of the involved lymph nodes, I could not visualize this anatomy, I could long palpate along each lateral aspect of the aorta. There was no identifiable plane between matted lymph nodes and underlying aorta or IVC. Given burden of disease and my concern that resection was not feasible, I asked my senior partner (Dr. Denman George) to evaluate intra-op. She agreed that moving forward with attempt at debulking involved lymph nodes was likely not feasible and an attempt at this time would be unsafe.   There was no obvious peritoneal disease and no clear involvement of gyn organs. Despite prior biopsy, findings at surgery remain concerning for a non-gynecologic process. If this is confirmed to be low-grade serous carcinoma of gyn origin, this was an R2 resection.    12/15/2020 Initial Diagnosis   Extraovarian primary peritoneal carcinoma ([place])   12/15/2020 Cancer Staging   Staging form: [place], AJCC 7th Edition - Pathologic stage from 12/15/2020: Stage IIIC (T3c,  N0, cM0) - Signed by Heath Lark, MD on 12/15/2020 Staged by: Managing physician Stage prefix: Initial diagnosis   01/04/2021 Procedure   Successful placement of a power injectable Port-A-Cath via the right internal jugular vein. The catheter is ready for immediate use.   01/08/2021 - 02/19/2021 Chemotherapy    Patient is on Treatment Plan: OVARIAN CARBOPLATIN (AUC 6) / PACLITAXEL (175) Q21D X 6 CYCLES       01/29/2021 Tumor Marker   Patient's tumor was tested for the following markers: CA-125. Results of the tumor marker test revealed 59.   02/05/2021 Imaging   Right:  No evidence of superficial vein thrombosis in the upper extremity.  Findings  consistent with acute deep vein thrombosis involving the right internal jugular vein.     Left:  No evidence of thrombosis in the subclavian.       03/10/2021 Tumor Marker   Patient's tumor was tested for the following markers: CA-125. Results of the tumor marker test revealed 81.8.   03/11/2021 Imaging   Stable bulky calcified retroperitoneal lymphadenopathy, consistent with metastatic disease.   Stable right hepatic lobe lesion, likely representing a benign hemangioma. This shoeds no FDG uptake on previous PET-CT scan.   No evidence of new or progressive disease within the abdomen or pelvis.   Aortic Atherosclerosis (ICD10-I70.0).   04/06/2021 Procedure   Successful removal of implanted Port-A-Cath.   04/29/2021 Tumor Marker   Patient's tumor was tested for the following markers: CA-125. Results of the tumor marker test revealed 29.4.   05/22/2021 Genetic Testing   Negative hereditary cancer genetic testing: no pathogenic variants detected in [facility] Multi-Cancer +RNA Panel.  The report date is 05/22/2021.   The Multi-Cancer + RNA Panel offered by [facility] includes sequencing and/or deletion/duplication analysis of the following 84 genes:  AIP*, ALK, APC*, ATM*, AXIN2*, BAP1*, BARD1*, BLM*, BMPR1A*, BRCA1*, BRCA2*, BRIP1*, CASR, CDC73*, CDH1*, CDK4, CDKN1B*,  CDKN1C*, CDKN2A, CEBPA, CHEK2*, CTNNA1*, DICER1*, DIS3L2*, EGFR, EPCAM, FH*, FLCN*, GATA2*, GPC3, GREM1, HOXB13, HRAS, KIT, MAX*, MEN1*, MET, MITF, MLH1*, MSH2*, MSH3*, MSH6*, MUTYH*, NBN*, NF1*, NF2*, NTHL1*, PALB2*, PDGFRA, PHOX2B, PMS2*, POLD1*, POLE*, POT1*, PRKAR1A*, PTCH1*, PTEN*, RAD50*, RAD51C*, RAD51D*, RB1*, RECQL4, RET, RUNX1*, SDHA*, SDHAF2*, SDHB*, SDHC*, SDHD*, SMAD4*, SMARCA4*, SMARCB1*, SMARCE1*, STK11*, SUFU*, TERC, TERT, TMEM127*, Tp53*, TSC1*, TSC2*, VHL*, WRN*, and WT1.  RNA analysis is performed for * genes.   07/08/2021 Tumor Marker   Patient's tumor was tested for the following markers: CA-125. Results of the tumor marker test revealed 16.   07/12/2021 Imaging   CT A/P 1. Mild decrease in size of isolated calcified abdominal retroperitoneal nodal metastasis. 2. No new or progressive disease. 3. Similar high right hepatic lobe lesion, favoring a hemangioma. 4.  Aortic Atherosclerosis (ICD10-I70.0).   09/13/2021 Tumor Marker   Patient's tumor was tested for the following markers: CA-125. Results of the tumor marker test revealed 56.1.   09/27/2021 Imaging   CT A/P 1. Slight decreased size calcified retroperitoneal lymph nodes. No new or progressive disease. 2. Stable lesion of the right hepatic lobe, likely a hemangioma. 3. Aortic Atherosclerosis (ICD10-I70.0).   12/28/2021 Imaging   CT A/P 1. Calcified abdominal adenopathy is minimally decreased in size since 09/28/2021, most consistent with response to therapy. 2. No new or progressive disease. 3. Similar borderline to mild common duct dilatation, without cause identified. This could be correlated with bilirubin level. 4.  Aortic Atherosclerosis (ICD10-I70.0).   12/31/2021 Tumor Marker   Patient's tumor was tested for the following markers: CA-125. Results  of the tumor marker test revealed 19.   04/01/2022 Tumor Marker   Patient's tumor was tested for the following markers: CA-125. Results of the tumor marker test  revealed 14.2.     Interval History: The patient reports overall doing well.  She and her husband just got back from vacation in [place]  She denies any abdominal pain.  Continues to struggle with some GI dysfunction.  Is using Metamucil but having very frequent small bowel movements, often loose or diarrhea.  She denies any urinary symptoms.  Denies any vaginal bleeding or discharge.  Continues to use vaginal estrogen with improvement in vaginal dryness and dyspareunia.  Weight is stable at home, denies any significant change in appetite.  Since finishing cancer treatment, notes feels cold all the time.  Past Medical/Surgical History: Past Medical History:  Diagnosis Date   Arthritis    Chronic pain    Contact lens/glasses fitting    wears contacts or glasses   Depression    Family history of melanoma 04/29/2021   Fibromyalgia    Melanoma ([place])    Peritoneal carcinoma ([place])    PONV (postoperative nausea and vomiting)    prolonged sedation very hard to wake    Past Surgical History:  Procedure Laterality Date   BACK SURGERY     lumb lam   BREAST BIOPSY Right    2021   BREAST BIOPSY Left    BREAST ENHANCEMENT SURGERY  05/30/1980   implants    BREAST IMPLANT EXCHANGE  05/30/2009   CARPAL TUNNEL RELEASE  05/30/2001   rt and lt   CARPOMETACARPEL SUSPENSION PLASTY  06/22/2012   Procedure: CARPOMETACARPEL ([place]) SUSPENSION PLASTY;  Surgeon: Alta Corning, MD;  Location: [facility];  Service: Orthopedics;  Laterality: Left;  burton's interposition arthroplasty left thumb    CATARACT EXTRACTION W/ INTRAOCULAR LENS  IMPLANT, BILATERAL     COLONOSCOPY     x2   DIAGNOSTIC LAPAROSCOPY  05/30/2000   expl lap   DILATION AND CURETTAGE OF UTERUS  05/30/2001   ablation   ELBOW SURGERY  05/31/1999   decompression rt   EPIDURAL BLOCK INJECTION     multiple   IR IMAGING GUIDED PORT INSERTION  01/04/2021   IR REMOVAL TUN ACCESS W/ PORT W/O FL MOD SED  04/06/2021    LYMPH NODE DISSECTION N/A 12/01/2020   Procedure: LYMPH NODE DISSECTION;  Surgeon: Lafonda Mosses, MD;  Location: [facility];  Service: Gynecology;  Laterality: N/A;   SKIN CANCER EXCISION     melanoma, from abdomen   TONSILLECTOMY      Family History  Problem Relation Age of Onset   Melanoma Mother        behind ear; on leg; dx after 52   Basal cell carcinoma Mother        face   Lung cancer Father 24       smoking hx   Leukemia Maternal Grandmother        dx after 57   Lung cancer Cousin        maternal female cousin; dx after 60; smoking hx   Cancer Cousin        paternal female cousin; unknown type; dx after 51   Colon cancer Neg Hx    Breast cancer Neg Hx    Ovarian cancer Neg Hx    Endometrial cancer Neg Hx    Pancreatic cancer Neg Hx    Prostate cancer Neg Hx  Social History   Socioeconomic History   Marital status: Married    Spouse name: Not on file   Number of children: Not on file   Years of education: Not on file   Highest education level: Not on file  Occupational History   Not on file  Tobacco Use   Smoking status: Never   Smokeless tobacco: Never  Vaping Use   Vaping Use: Never used  Substance and Sexual Activity   Alcohol use: Yes    Comment: rare   Drug use: No   Sexual activity: Yes    Partners: Male    Birth control/protection: Post-menopausal  Other Topics Concern   Not on file  Social History Narrative   Not on file   Social Determinants of Health   Financial Resource Strain: Not on file  Food Insecurity: Not on file  Transportation Needs: Not on file  Physical Activity: Not on file  Stress: Not on file  Social Connections: Not on file    Current Medications:  Current Outpatient Medications:    cholecalciferol (VITAMIN D3) 25 MCG (1000 UNIT) tablet, Take 1,000 Units by mouth daily., Disp: , Rfl:    estradiol (ESTRACE VAGINAL) 0.1 MG/GM vaginal cream, Place 1 Applicatorful vaginally 3 (three) times a week., Disp: 42.5 g, Rfl:  12   FLUoxetine (PROZAC) 20 MG capsule, Take 20 mg by mouth every morning., Disp: , Rfl:    Omega-3 Fatty Acids (FISH OIL BURP-LESS) 1000 MG CAPS, Take by mouth., Disp: , Rfl:    tamoxifen (NOLVADEX) 20 MG tablet, Take 1 tablet (20 mg total) by mouth 2 (two) times daily., Disp: 180 tablet, Rfl: 3   traZODone (DESYREL) 25 mg TABS tablet, Take 25 mg by mouth at bedtime., Disp: , Rfl:   Review of Systems: + diarrhea Denies appetite changes, fevers, chills, fatigue, unexplained weight changes. Denies hearing loss, neck lumps or masses, mouth sores, ringing in ears or voice changes. Denies cough or wheezing.  Denies shortness of breath. Denies chest pain or palpitations. Denies leg swelling. Denies abdominal distention, pain, blood in stools, constipation, nausea, vomiting, or early satiety. Denies pain with intercourse, dysuria, frequency, hematuria or incontinence. Denies hot flashes, pelvic pain, vaginal bleeding or vaginal discharge.   Denies joint pain, back pain or muscle pain/cramps. Denies itching, rash, or wounds. Denies dizziness, headaches, numbness or seizures. Denies swollen lymph nodes or glands, denies easy bruising or bleeding. Denies anxiety, depression, confusion, or decreased concentration.  Physical Exam: BP 120/69 (BP Location: Left Arm, Patient Position: Sitting)   Pulse (!) 54   Temp 98.2 F (36.8 C) (Oral)   Wt 132 lb 12.8 oz (60.2 kg)   SpO2 100%   BMI 23.10 kg/m  General: Alert, oriented, no acute distress. HEENT: Normocephalic, atraumatic, sclera anicteric. Chest: Clear to auscultation bilaterally.  No wheezes or rhonchi. Cardiovascular: Regular rate and rhythm, no murmurs. Abdomen: soft, nontender.  Normoactive bowel sounds.  No masses or hepatosplenomegaly appreciated.  Well-healed incisions. Extremities: Grossly normal range of motion.  Warm, well perfused.  No edema bilaterally. Skin: No rashes or lesions noted. Lymphatics: No cervical, supraclavicular,  or inguinal adenopathy. GU: Normal appearing external genitalia without erythema, excoriation, or lesions.  Speculum exam reveals mildly atrophic vaginal mucosa, no lesions or masses noted.  Cuff intact.  Bimanual exam reveals no masses or nodularity.  Rectovaginal exam confirms the findings.  Laboratory & Radiologic Studies: CT A/P on 07/12/22: 1. Stable examination without evidence of new or progressive disease in the abdomen or  pelvis. 2. Calcified retroperitoneal nodal conglomerate is slightly decreased in size. 3. Unchanged prominence of the intra and extrahepatic biliary tree with the common duct measuring 8 mm, persistent effacement of the distal duct near the ampulla by a periampullary duodenal diverticulum. Constellation of findings likely reflect ductal prominence secondary to back pressure related to the distal ductal effacement by the duodenal diverticulum. Consider correlation with laboratory values for biliary obstruction if not previously performed. 4. Hepatomegaly with probable mild diffuse hepatic steatosis. 5. Colonic diverticulosis without findings of acute diverticulitis. 6.  Aortic Atherosclerosis (ICD10-I70.0).  Assessment & Plan: [name] [name] is a 67 y.o. woman with Stage IIIC presumed primary peritoneal carcinoma, low-grade. Currently on Tamoxifen.   Patient is doing well and continues to improve in terms of how she feels. She is tolerating tamoxifen very well.    Given symptoms of feeling cold, will check thyroid level.  Does not look like the thyroid has been checked since 2020.  Reviewed CT findings, continued prominence of the common bile duct.  Will check LFTs today.   Ca1 25 was drawn today.  I will contact her with these results.     We will plan for surveillance visit in 3 months with blood work at that time.  Signs and symptoms that should prompt a phone call between visits were reviewed.  22 minutes of total time was spent for this patient  encounter, including preparation, face-to-face counseling with the patient and coordination of care, and documentation of the encounter.  Jeral Pinch, MD  Division of Gynecologic Oncology  Department of Obstetrics and Gynecology  [facility]

## 2022-07-15 ENCOUNTER — Encounter: Payer: Self-pay | Admitting: Gynecologic Oncology

## 2022-07-15 ENCOUNTER — Other Ambulatory Visit: Payer: Self-pay

## 2022-07-15 ENCOUNTER — Inpatient Hospital Stay: Payer: Medicare Other | Attending: Hematology and Oncology | Admitting: Gynecologic Oncology

## 2022-07-15 ENCOUNTER — Inpatient Hospital Stay: Payer: Medicare Other

## 2022-07-15 VITALS — BP 120/69 | HR 54 | Temp 98.2°F | Wt 132.8 lb

## 2022-07-15 DIAGNOSIS — Z79899 Other long term (current) drug therapy: Secondary | ICD-10-CM | POA: Diagnosis not present

## 2022-07-15 DIAGNOSIS — Z9079 Acquired absence of other genital organ(s): Secondary | ICD-10-CM | POA: Diagnosis not present

## 2022-07-15 DIAGNOSIS — K838 Other specified diseases of biliary tract: Secondary | ICD-10-CM

## 2022-07-15 DIAGNOSIS — C482 Malignant neoplasm of peritoneum, unspecified: Secondary | ICD-10-CM | POA: Diagnosis present

## 2022-07-15 DIAGNOSIS — Z9221 Personal history of antineoplastic chemotherapy: Secondary | ICD-10-CM | POA: Diagnosis not present

## 2022-07-15 DIAGNOSIS — Z79810 Long term (current) use of selective estrogen receptor modulators (SERMs): Secondary | ICD-10-CM | POA: Diagnosis not present

## 2022-07-15 DIAGNOSIS — C481 Malignant neoplasm of specified parts of peritoneum: Secondary | ICD-10-CM

## 2022-07-15 DIAGNOSIS — Z90710 Acquired absence of both cervix and uterus: Secondary | ICD-10-CM | POA: Diagnosis not present

## 2022-07-15 DIAGNOSIS — Z90722 Acquired absence of ovaries, bilateral: Secondary | ICD-10-CM | POA: Insufficient documentation

## 2022-07-15 LAB — TSH: TSH: 2.891 u[IU]/mL (ref 0.350–4.500)

## 2022-07-15 LAB — HEPATIC FUNCTION PANEL
ALT: 16 U/L (ref 0–44)
AST: 17 U/L (ref 15–41)
Albumin: 3.9 g/dL (ref 3.5–5.0)
Alkaline Phosphatase: 32 U/L — ABNORMAL LOW (ref 38–126)
Bilirubin, Direct: <0.1 mg/dL (ref 0.0–0.2)
Total Bilirubin: 0.6 mg/dL (ref 0.3–1.2)
Total Protein: 6.2 g/dL — ABNORMAL LOW (ref 6.5–8.1)

## 2022-07-15 NOTE — Patient Instructions (Addendum)
It was good to see you today.  I will release your CA-125 to you when back.  I will also let you know when I get your liver testing and thyroid levels back.  Please increase your Metamucil and reach out to your GI doctor for follow-up.  I will see you for follow-up in 3 months.  As always, if you develop any new and concerning symptoms before your next visit, please call to see me sooner.

## 2022-07-17 LAB — CA 125: Cancer Antigen (CA) 125: 11.7 U/mL (ref 0.0–38.1)

## 2022-09-04 IMAGING — XA IR IMAGING GUIDED PORT INSERTION
1 series · 2 of 2 positions shown · non-contrast
Comparison: None.

INDICATION: 64-year-old female with history of extra ovarian primary peritoneal
carcinoma requiring central venous access for chemotherapy.

EXAM:
IMPLANTED PORT A CATH PLACEMENT WITH ULTRASOUND AND FLUOROSCOPIC
GUIDANCE

[Series 1: care single · 2 of 2 slices shown]
[im 1/2]
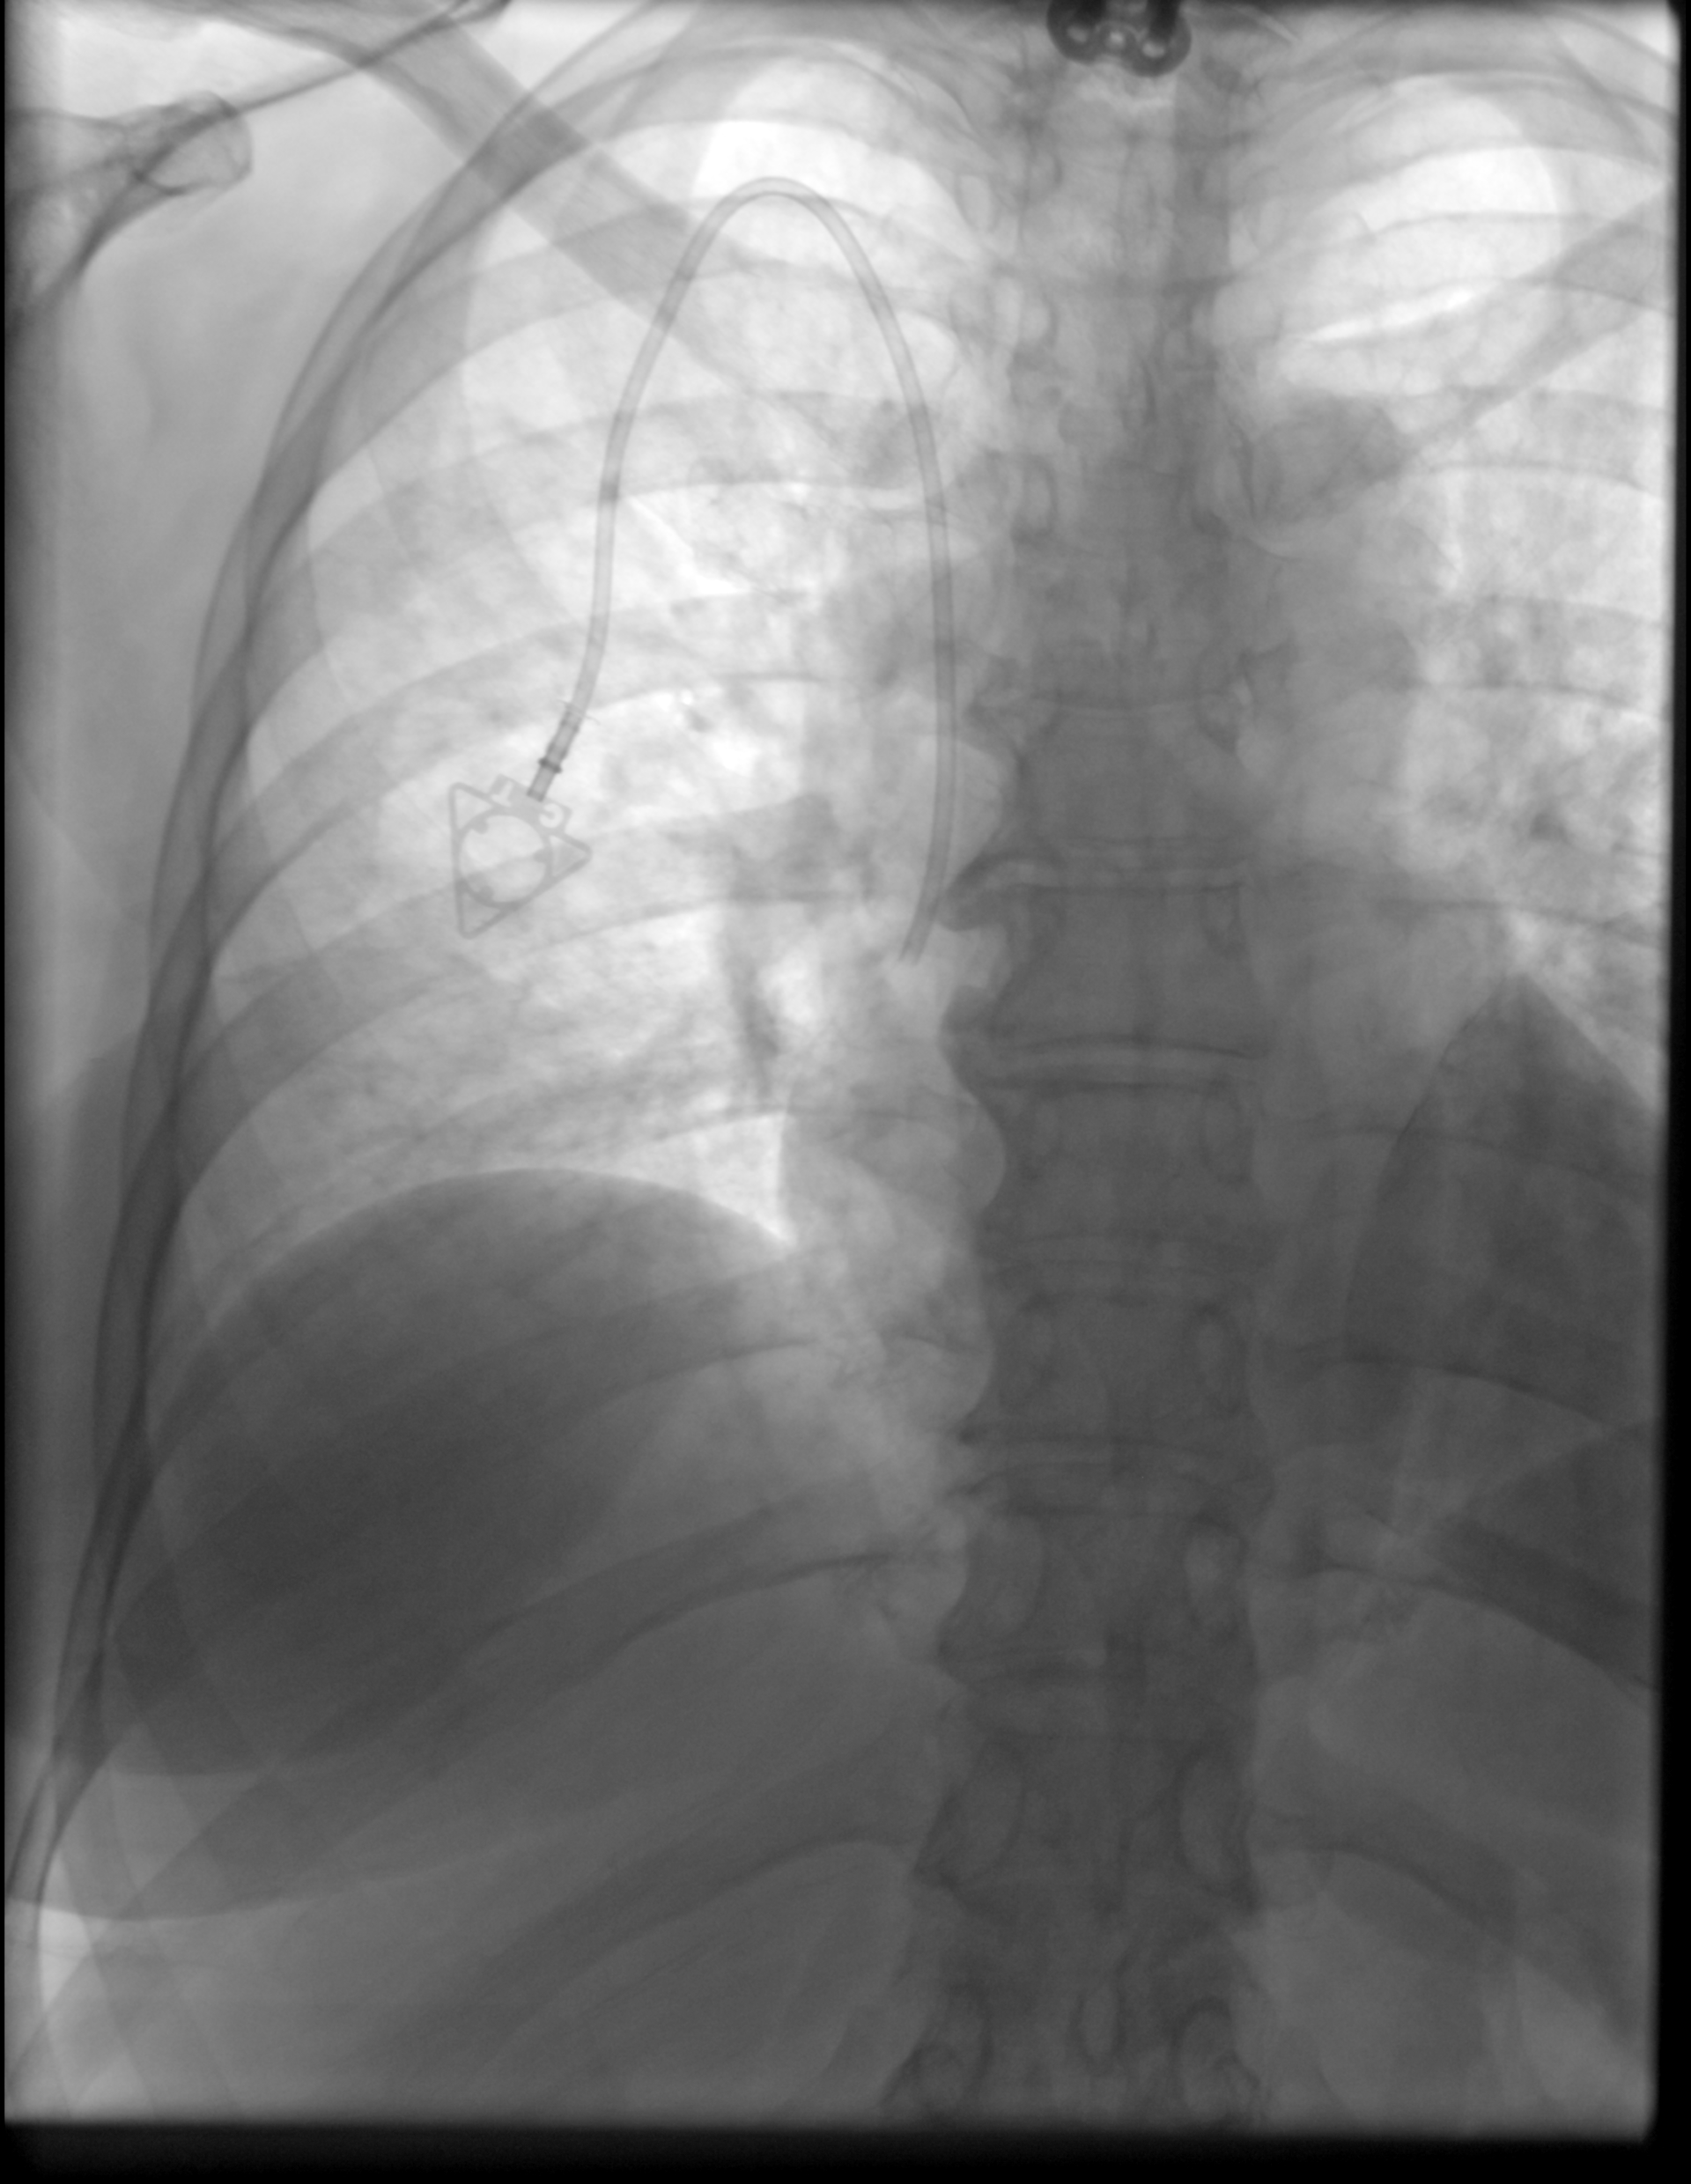
[im 2/2]
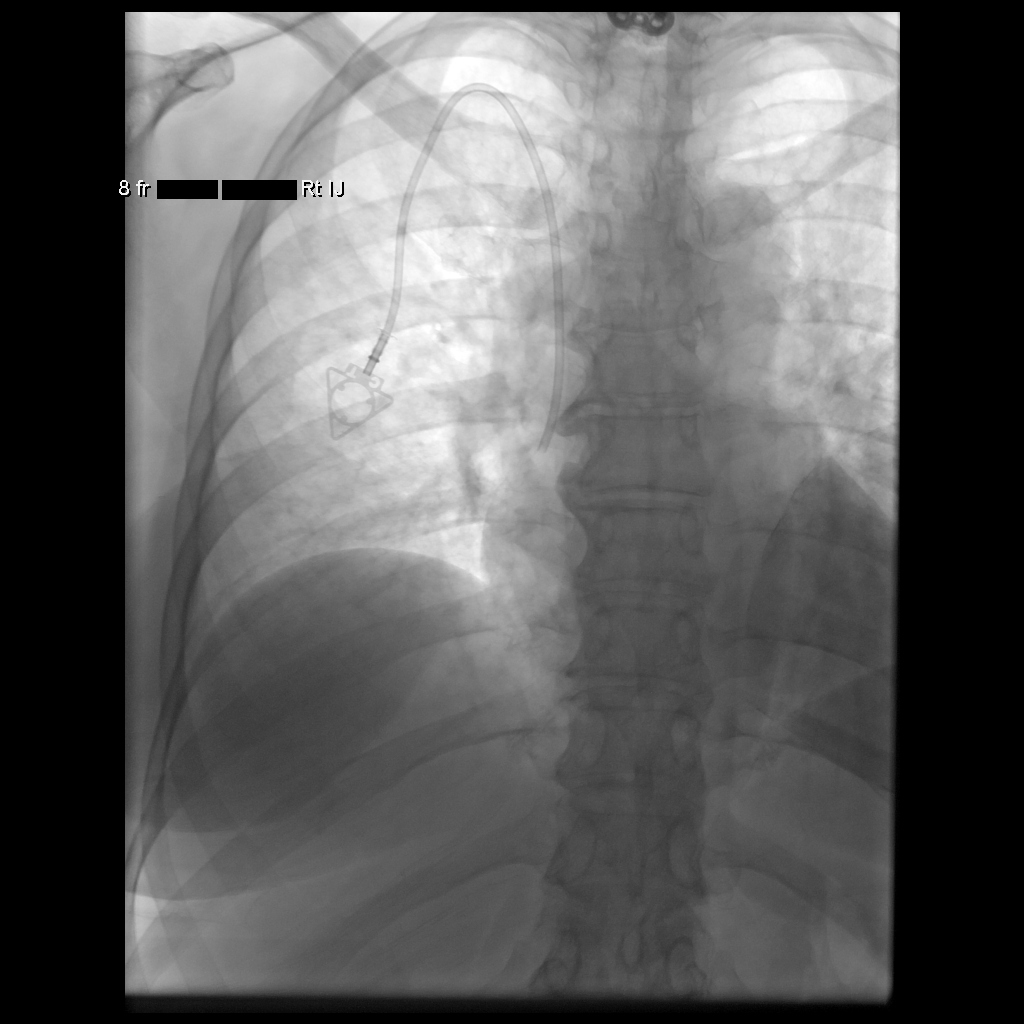

[2 of 2 positions shown; findings below may reference images not displayed]

MEDICATIONS:
None.

ANESTHESIA/SEDATION:
Moderate (conscious) sedation was employed during this procedure. A
total of Versed 2 mg and Fentanyl 100 mcg was administered
intravenously.

Moderate Sedation Time: 16 minutes. The patient's level of
consciousness and vital signs were monitored continuously by
radiology nursing throughout the procedure under my direct
supervision.

CONTRAST:  None

FLUOROSCOPY TIME:  0 minutes, 6 seconds (1 mGy)

COMPLICATIONS:
None immediate.

PROCEDURE:
The procedure, risks, benefits, and alternatives were explained to
the patient. Questions regarding the procedure were encouraged and
answered. The patient understands and consents to the procedure.

The right neck and chest were prepped with chlorhexidine in a
sterile fashion, and a sterile drape was applied covering the
operative field. Maximum barrier sterile technique with sterile
gowns and gloves were used for the procedure. A timeout was
performed prior to the initiation of the procedure.

Ultrasound was used to examine the jugular vein which was
compressible and free of internal echoes. A skin marker was used to
demarcate the planned venotomy and port pocket incision sites. Local
anesthesia was provided to these sites and the subcutaneous tunnel
track with 1% lidocaine with [DATE] epinephrine.

A small incision was created at the jugular access site and blunt
dissection was performed of the subcutaneous tissues. Under
ultrasound guidance, the jugular vein was accessed with a 21 ga
micropuncture needle and an 0.018" wire was inserted to the superior
vena cava. Real-time ultrasound guidance was utilized for vascular
access including the acquisition of a permanent ultrasound image
documenting patency of the accessed vessel. A 5 Fr micopuncture set
was then used, through which a 0.035" Rosen wire was passed under
fluoroscopic guidance into the inferior vena cava. An 8 Fr dilator
was then placed over the wire.

A subcutaneous port pocket was then created along the upper chest
wall utilizing a combination of sharp and blunt dissection. The
pocket was irrigated with sterile saline, packed with gauze, and
observed for hemorrhage. A single lumen "ISP" sized power injectable
port was chosen for placement. The 8 Fr catheter was tunneled from
the port pocket site to the venotomy incision. The port was placed
in the pocket. The external catheter was trimmed to appropriate
length. The dilator was exchanged for an 8 Fr peel-away sheath under
fluoroscopic guidance. The catheter was then placed through the
sheath and the sheath was removed. Final catheter positioning was
confirmed and documented with a fluoroscopic spot radiograph. The
port was accessed with Sappinen Tapaila needle, aspirated, and flushed with
heparinized saline.

The deep dermal layer of the port pocket incision was closed with
interrupted 3-0 Vicryl suture. The skin was opposed with a running
subcuticular 4-0 Monocryl suture. Dermabond was then placed over the
port pocket and neck incisions. The patient tolerated the procedure
well without immediate post procedural complication.
FINDINGS: After catheter placement, the tip lies within the superior
cavoatrial junction. The catheter aspirates and flushes normally and
is ready for immediate use.
IMPRESSION: Successful placement of a power injectable Port-A-Cath via the right
internal jugular vein. The catheter is ready for immediate use.

## 2022-10-18 DIAGNOSIS — R3915 Urgency of urination: Secondary | ICD-10-CM | POA: Insufficient documentation

## 2022-11-09 ENCOUNTER — Other Ambulatory Visit: Payer: Self-pay | Admitting: Gynecologic Oncology

## 2022-11-09 DIAGNOSIS — C569 Malignant neoplasm of unspecified ovary: Secondary | ICD-10-CM

## 2022-11-09 DIAGNOSIS — C481 Malignant neoplasm of specified parts of peritoneum: Secondary | ICD-10-CM

## 2022-11-10 ENCOUNTER — Other Ambulatory Visit: Payer: Self-pay

## 2022-11-10 ENCOUNTER — Encounter: Payer: Self-pay | Admitting: Gynecologic Oncology

## 2022-11-10 ENCOUNTER — Inpatient Hospital Stay: Payer: Medicare Other

## 2022-11-10 ENCOUNTER — Inpatient Hospital Stay: Payer: Medicare Other | Attending: Gynecologic Oncology | Admitting: Gynecologic Oncology

## 2022-11-10 VITALS — BP 128/66 | HR 61 | Temp 98.3°F | Resp 16 | Ht 61.77 in | Wt 144.6 lb

## 2022-11-10 DIAGNOSIS — Z86718 Personal history of other venous thrombosis and embolism: Secondary | ICD-10-CM | POA: Insufficient documentation

## 2022-11-10 DIAGNOSIS — Z90710 Acquired absence of both cervix and uterus: Secondary | ICD-10-CM | POA: Insufficient documentation

## 2022-11-10 DIAGNOSIS — Z9221 Personal history of antineoplastic chemotherapy: Secondary | ICD-10-CM | POA: Diagnosis not present

## 2022-11-10 DIAGNOSIS — C482 Malignant neoplasm of peritoneum, unspecified: Secondary | ICD-10-CM | POA: Insufficient documentation

## 2022-11-10 DIAGNOSIS — Z79810 Long term (current) use of selective estrogen receptor modulators (SERMs): Secondary | ICD-10-CM | POA: Diagnosis not present

## 2022-11-10 DIAGNOSIS — F0390 Unspecified dementia without behavioral disturbance: Secondary | ICD-10-CM | POA: Diagnosis not present

## 2022-11-10 DIAGNOSIS — C569 Malignant neoplasm of unspecified ovary: Secondary | ICD-10-CM

## 2022-11-10 DIAGNOSIS — Z90722 Acquired absence of ovaries, bilateral: Secondary | ICD-10-CM | POA: Diagnosis not present

## 2022-11-10 DIAGNOSIS — C481 Malignant neoplasm of specified parts of peritoneum: Secondary | ICD-10-CM

## 2022-11-10 NOTE — Progress Notes (Signed)
Gynecologic Oncology Return Clinic Visit  11/10/22  Reason for Visit: Surveillance visit in the setting of low-grade serous carcinoma   Treatment History: Oncology History Overview Note  Low grade serous, ER positive [facility] done on 8/9: HRD not detected, MSI stable, low tumor mutational burden 1 Muts/Mb, no alterations of BRCA1 & 2   Extraovarian primary peritoneal carcinoma (HCC)  09/29/2020 Imaging   MRI lumbar spine The dominant finding in this case is that of massive retroperitoneal lymphadenopathy, likely related to either metastatic disease or lymphoma.   Chronic degenerative and postoperative findings of the spine as outlined in detail above   10/05/2020 Imaging   IMPRESSION outside CT 1. Large calcified retroperitoneal lymph nodes highly concerning for metastatic disease, likely related to primary carcinoma of the colon or lymphoma. Clinical correlation is recommended. 2. No bowel obstruction. Normal appendix. 3. Stable right hepatic dome hemangioma.   10/30/2020 PET scan   1. The densely calcified retroperitoneal adenopathy is highly hypermetabolic with maximum SUV of 23.4 (Deauville 5) there is also Deauville 3 activity in a small right axillary lymph node. Given the lack of an obvious primary outside of the retroperitoneum, appearance tends to favor lymphoma or Castleman's disease. Tissue diagnosis suggested. 2. Other imaging findings of potential clinical significance: Chronic left maxillary sinusitis. Aortic Atherosclerosis (ICD10-I70.0). Coronary atherosclerosis.   11/10/2020 Procedure   CT guided biopsy of retroperitoneal adenopathy   11/10/2020 Pathology Results   biopsy results which favor a low-grade serous carcinoma of gyn origin. This is a somewhat atypical presentation, but my suspicion is that it is primary peritoneal carcinoma   11/16/2020 Tumor Marker   Patient's tumor was tested for the following markers: CA-125. Results of the tumor marker test revealed  96.4.   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 UTERUS, CERVIX, BILATERAL FALLOPIAN TUBES AND OVARIES:  Uterus:  -  Inactive endometrium  -  Leiomyoma (1.1 cm; largest)  -  Calcified nodule of the broad ligament with psammoma bodies (right)  -  No hyperplasia or malignancy identified   Cervix:  -  Benign cervix  -  No dysplasia or malignancy identified   Ovaries, bilateral:  -  Serous cyst adenofibroma with microscopic focus of borderline change  (right)    Fallopian tubes, bilateral:  -  Benign fallopian tube with paratubal cyst (right)  -  No malignancy identified   B. OMENTUM, OMENTECTOMY:  -  No carcinoma identified   C. PERIAORTIC TUMOR, EXCISION:  -  Serous carcinoma, low-grade, with psammoma bodies  -  See comment below   COMMENT:   C.  At the request of the treating clinician, immunohistochemistry was repeated.  The neoplastic cells are positive for ER, cytokeratin 7, cytokeratin 5/6, p53, PAX8 and WT1 but negative for cytokeratin 20,  TTF-1, PR and thyroglobulin.  The morphology and immunophenotype are consistent with a primary peritoneal low-grade serous carcinoma (see also, [id number]).   ONCOLOGY TABLE:   OVARY or FALLOPIAN TUBE or PRIMARY PERITONEUM: Resection   Procedure: Total hysterectomy and bilateral salpingo-oophorectomy with omentectomy and periaortic tumor excision  Specimen Integrity: Intact  Tumor Site: Primary peritoneum (see comment)  Tumor Size: See comment  Histologic Type: Low-grade serous carcinoma  Histologic Grade: Low-grade  Ovarian Surface Involvement: Not identified  Fallopian Tube Surface Involvement: Not identified  Implants (  required for advanced stage serous/seromucinous borderline  tumors only): Not applicable  Other Tissue/  Organ Involvement: Periaortic tumor  Largest Extrapelvic Peritoneal Focus: 5.5 cm (aggregate measurement)  Peritoneal/Ascitic Fluid Involvement: Not applicable  Chemotherapy Response Score (CRS): Not applicable, no known presurgical therapy  Regional Lymph Nodes: Not applicable (no lymph nodes submitted or found)  Distant Metastasis:       Distant Site(s) Involved: Not applicable  Pathologic Stage Classification (pTNM, AJCC 8th Edition): pT3, pN not assigned  Ancillary Studies: Can be performed upon request  Representative Tumor Block: C1  Comment(s): The only focus of low-grade serous carcinoma identified in the resection specimen is the tissue submitted as periaortic tumor (part C).  Clinically, this was favored to represent matted lymph nodes; however, there is no evidence of nodal tissue in the examined material. This case was discussed with Dr. Berline Lopes and given the extent of the matted lymph nodes, this is being staged as a pT3.     12/01/2020 Surgery   Pre-operative Diagnosis: low grade serous carcinoma with significant retroperitoneal adenopathy, no other obvious imaging evidence of disease   Post-operative Diagnosis: same, significantly adherent presacral and para-aortic lymphadenopathy   Operation: Robotic-assisted laparoscopic total hysterectomy with bilateral salpingo-oophorectomy, laparotomy, infra-colic omentectomy, portion of necrotic tumor-ridden retroperitoneal lymph nodes, oversew of sigmoid serosa   Surgeon: Jeral Pinch MD   Assistant Surgeon: Lahoma Crocker MD (an MD assistant was necessary for tissue manipulation, management of robotic instrumentation, retraction and positioning due to the complexity of the case and hospital policies).    Intra-operative consultant:  Everitt Amber MD    Operative Findings: On EUA, small mobile uterus. ON intra-abdominal entry, normal liver and diaphragm. One filmy adhesion noted between the right liver lobe and the anterior  abdominal wall. The spleen was somewhat lateral and appeared mildly enlarged. There were some adhesions of the infragastric omentum to the anterior abdominal wall in the midline. The omentum was normal appearing without obvious evidence of tumor. Small and large bowel were normal appearing, appendix normal. Uterus 6cm and normal in appearance. Somewhat atrophic bilateral adnexa. There were filmy adhesions between each adnexa and the pelvic sidewall. Some filmy adhesions were also noted between the posterior uterus and rectum. Approximately 0.5-1cm white nodule within the broad ligament, lateral to the right cornua, c/w possible fibroma vs fibroid. No pelvic adenopathy. Significantly enlarged, necrotic, and tumor-filled lymph node conglomeration was noted starting within the presacral space (adherent to the sacral promontory) and extending superiorly along the great vessels to the level of the renal vessel (suspected). Given adherence of the involved lymph nodes, I could not visualize this anatomy, I could long palpate along each lateral aspect of the aorta. There was no identifiable plane between matted lymph nodes and underlying aorta or IVC. Given burden of disease and my concern that resection was not feasible, I asked my senior partner (Dr. Denman George) to evaluate intra-op. She agreed that moving forward with attempt at debulking involved lymph nodes was likely not feasible and an attempt at this time would be unsafe.   There was no obvious peritoneal disease and no clear involvement of gyn organs. Despite prior biopsy, findings at surgery remain concerning for a non-gynecologic process. If this is confirmed to be low-grade serous carcinoma of gyn origin, this was an R2 resection.    12/15/2020 Initial Diagnosis   Extraovarian primary peritoneal carcinoma ([facility])   12/15/2020 Cancer Staging   Staging form: [place], AJCC 7th Edition - Pathologic stage from 12/15/2020: Stage IIIC (T3c,  N0, cM0) - Signed by Heath Lark, MD on 12/15/2020 Staged by: Managing physician Stage prefix: Initial diagnosis   01/04/2021 Procedure   Successful placement of a power injectable Port-A-Cath via the right internal jugular vein. The catheter is ready for immediate use.   01/08/2021 - 02/19/2021 Chemotherapy    Patient is on Treatment Plan: OVARIAN CARBOPLATIN (AUC 6) / PACLITAXEL (175) Q21D X 6 CYCLES       01/29/2021 Tumor Marker   Patient's tumor was tested for the following markers: CA-125. Results of the tumor marker test revealed 59.   02/05/2021 Imaging   Right:  No evidence of superficial vein thrombosis in the upper extremity.  Findings  consistent with acute deep vein thrombosis involving the right internal jugular vein.     Left:  No evidence of thrombosis in the subclavian.       03/10/2021 Tumor Marker   Patient's tumor was tested for the following markers: CA-125. Results of the tumor marker test revealed 81.8.   03/11/2021 Imaging   Stable bulky calcified retroperitoneal lymphadenopathy, consistent with metastatic disease.   Stable right hepatic lobe lesion, likely representing a benign hemangioma. This shoeds no FDG uptake on previous PET-CT scan.   No evidence of new or progressive disease within the abdomen or pelvis.   Aortic Atherosclerosis (ICD10-I70.0).   04/06/2021 Procedure   Successful removal of implanted Port-A-Cath.   04/29/2021 Tumor Marker   Patient's tumor was tested for the following markers: CA-125. Results of the tumor marker test revealed 29.4.   05/22/2021 Genetic Testing   Negative hereditary cancer genetic testing: no pathogenic variants detected in [facility] Multi-Cancer +RNA Panel.  The report date is 05/22/2021.   The Multi-Cancer + RNA Panel offered by [facility] includes sequencing and/or deletion/duplication analysis of the following 84 genes:  AIP*, ALK, APC*, ATM*, AXIN2*, BAP1*, BARD1*, BLM*, BMPR1A*, BRCA1*, BRCA2*, BRIP1*, CASR, CDC73*, CDH1*, CDK4, CDKN1B*,  CDKN1C*, CDKN2A, CEBPA, CHEK2*, CTNNA1*, DICER1*, DIS3L2*, EGFR, EPCAM, FH*, FLCN*, GATA2*, GPC3, GREM1, HOXB13, HRAS, KIT, MAX*, MEN1*, MET, MITF, MLH1*, MSH2*, MSH3*, MSH6*, MUTYH*, NBN*, NF1*, NF2*, NTHL1*, PALB2*, PDGFRA, PHOX2B, PMS2*, POLD1*, POLE*, POT1*, PRKAR1A*, PTCH1*, PTEN*, RAD50*, RAD51C*, RAD51D*, RB1*, RECQL4, RET, RUNX1*, SDHA*, SDHAF2*, SDHB*, SDHC*, SDHD*, SMAD4*, SMARCA4*, SMARCB1*, SMARCE1*, STK11*, SUFU*, TERC, TERT, TMEM127*, Tp53*, TSC1*, TSC2*, VHL*, WRN*, and WT1.  RNA analysis is performed for * genes.   07/08/2021 Tumor Marker   Patient's tumor was tested for the following markers: CA-125. Results of the tumor marker test revealed 16.   07/12/2021 Imaging   CT A/P 1. Mild decrease in size of isolated calcified abdominal retroperitoneal nodal metastasis. 2. No new or progressive disease. 3. Similar high right hepatic lobe lesion, favoring a hemangioma. 4.  Aortic Atherosclerosis (ICD10-I70.0).   09/13/2021 Tumor Marker   Patient's tumor was tested for the following markers: CA-125. Results of the tumor marker test revealed 56.1.   09/27/2021 Imaging   CT A/P 1. Slight decreased size calcified retroperitoneal lymph nodes. No new or progressive disease. 2. Stable lesion of the right hepatic lobe, likely a hemangioma. 3. Aortic Atherosclerosis (ICD10-I70.0).   12/28/2021 Imaging   CT A/P 1. Calcified abdominal adenopathy is minimally decreased in size since 09/28/2021, most consistent with response to therapy. 2. No new or progressive disease. 3. Similar borderline to mild common duct dilatation, without cause identified. This could be correlated with bilirubin level. 4.  Aortic Atherosclerosis (ICD10-I70.0).   12/31/2021 Tumor Marker   Patient's tumor was tested for the following markers: CA-125. Results  of the tumor marker test revealed 19.   04/01/2022 Tumor Marker   Patient's tumor was tested for the following markers: CA-125. Results of the tumor marker test  revealed 14.2.     Interval History: Doing well.  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  Notes some increased urination.  Saw urologist recently and was started on new medication which has caused some mild constipation.  Abdominal/GI symptoms are stable, no acute changes.  Has been having some back and neck pain, working with a physical therapist with improvement.  Past Medical/Surgical History: Past Medical History:  Diagnosis Date   Arthritis    Chronic pain    Contact lens/glasses fitting    wears contacts or glasses   Depression    Family history of melanoma 04/29/2021   Fibromyalgia    Melanoma (HCC)    Peritoneal carcinoma (HCC)    PONV (postoperative nausea and vomiting)    prolonged sedation very hard to wake    Past Surgical History:  Procedure Laterality Date   BACK SURGERY     lumb lam   BREAST BIOPSY Right    2021   BREAST BIOPSY Left    BREAST ENHANCEMENT SURGERY  05/30/1980   implants    BREAST IMPLANT EXCHANGE  05/30/2009   CARPAL TUNNEL RELEASE  05/30/2001   rt and lt   CARPOMETACARPEL SUSPENSION PLASTY  06/22/2012   Procedure: CARPOMETACARPEL (CMC) SUSPENSION PLASTY;  Surgeon: Harvie Junior, MD;  Location: [facility];  Service: Orthopedics;  Laterality: Left;  burton's interposition arthroplasty left thumb    CATARACT EXTRACTION W/ INTRAOCULAR LENS  IMPLANT, BILATERAL     COLONOSCOPY     x2   DIAGNOSTIC LAPAROSCOPY  05/30/2000   expl lap   DILATION AND CURETTAGE OF UTERUS  05/30/2001   ablation   ELBOW SURGERY  05/31/1999   decompression rt   EPIDURAL BLOCK INJECTION     multiple   IR IMAGING GUIDED PORT INSERTION  01/04/2021   IR REMOVAL TUN ACCESS W/ PORT W/O FL MOD SED  04/06/2021   LYMPH NODE DISSECTION N/A 12/01/2020   Procedure: LYMPH NODE DISSECTION;  Surgeon: Carver Fila, MD;  Location: [facility];  Service: Gynecology;  Laterality: N/A;   SKIN CANCER EXCISION     melanoma, from abdomen    TONSILLECTOMY      Family History  Problem Relation Age of Onset   Melanoma Mother        behind ear; on leg; dx after 50   Basal cell carcinoma Mother        face   Lung cancer Father 40       smoking hx   Leukemia Maternal Grandmother        dx after 65   Lung cancer Cousin        maternal female cousin; dx after 12; smoking hx   Cancer Cousin        paternal female cousin; unknown type; dx after 90   Colon cancer Neg Hx    Breast cancer Neg Hx    Ovarian cancer Neg Hx    Endometrial cancer Neg Hx    Pancreatic cancer Neg Hx    Prostate cancer Neg Hx     Social History   Socioeconomic History   Marital status: Married    Spouse name: Not on file   Number of children: Not on file   Years of education: Not on file  Highest education level: Not on file  Occupational History   Not on file  Tobacco Use   Smoking status: Never   Smokeless tobacco: Never  Vaping Use   Vaping Use: Never used  Substance and Sexual Activity   Alcohol use: Yes    Comment: rare   Drug use: No   Sexual activity: Yes    Partners: Male    Birth control/protection: Post-menopausal  Other Topics Concern   Not on file  Social History Narrative   Not on file   Social Determinants of Health   Financial Resource Strain: Not on file  Food Insecurity: Not on file  Transportation Needs: Not on file  Physical Activity: Not on file  Stress: Not on file  Social Connections: Not on file    Current Medications:  Current Outpatient Medications:    cholecalciferol (VITAMIN D3) 25 MCG (1000 UNIT) tablet, Take 1,000 Units by mouth daily., Disp: , Rfl:    estradiol (ESTRACE VAGINAL) 0.1 MG/GM vaginal cream, Place 1 Applicatorful vaginally 3 (three) times a week., Disp: 42.5 g, Rfl: 12   FLUoxetine (PROZAC) 20 MG capsule, Take 20 mg by mouth every morning., Disp: , Rfl:    Omega-3 Fatty Acids (FISH OIL BURP-LESS) 1000 MG CAPS, Take by mouth., Disp: , Rfl:    tamoxifen (NOLVADEX) 20 MG tablet, Take  1 tablet (20 mg total) by mouth 2 (two) times daily., Disp: 180 tablet, Rfl: 3   traZODone (DESYREL) 25 mg TABS tablet, Take 25 mg by mouth at bedtime., Disp: , Rfl:   Review of Systems: Denies appetite changes, fevers, chills, fatigue, unexplained weight changes. Denies hearing loss, neck lumps or masses, mouth sores, ringing in ears or voice changes. Denies cough or wheezing.  Denies shortness of breath. Denies chest pain or palpitations. Denies leg swelling. Denies abdominal distention, blood in stools, diarrhea, nausea, vomiting, or early satiety. Denies pain with intercourse, dysuria, hematuria. Denies hot flashes, pelvic pain, vaginal bleeding or vaginal discharge.  Denies itching, rash, or wounds. Denies dizziness, headaches, numbness or seizures. Denies swollen lymph nodes or glands, denies easy bruising or bleeding. Denies anxiety, depression, confusion, or decreased concentration.  Physical Exam: BP 128/66 (BP Location: Left Arm, Patient Position: Sitting)   Pulse 61   Temp 98.3 F (36.8 C) (Oral)   Resp 16   Ht 5' 1.77" (1.569 m)   Wt 144 lb 9.6 oz (65.6 kg)   SpO2 100%   BMI 26.64 kg/m  General: Alert, oriented, no acute distress. HEENT: Normocephalic, atraumatic, sclera anicteric. Chest: Clear to auscultation bilaterally.  No wheezes or rhonchi. Cardiovascular: Regular rate and rhythm, no murmurs. Abdomen: soft, nontender.  Normoactive bowel sounds.  No masses or hepatosplenomegaly appreciated.  Well-healed incisions. Extremities: Grossly normal range of motion.  Warm, well perfused.  No edema bilaterally. Skin: No rashes or lesions noted. Lymphatics: No cervical, supraclavicular, or inguinal adenopathy. GU: Normal appearing external genitalia without erythema, excoriation, or lesions.  Speculum exam reveals mildly atrophic vaginal mucosa, no lesions or masses noted.  Cuff intact.  Bimanual exam reveals no masses or nodularity.  Rectovaginal exam confirms the  findings.  Laboratory & Radiologic Studies:          Component Ref Range & Units 3 mo ago (07/15/22) 7 mo ago (04/01/22) 10 mo ago (12/31/21) 1 yr ago (09/13/21) 1 yr ago (07/07/21) 1 yr ago (04/29/21) 1 yr ago (03/10/21)  Cancer Antigen (CA) 125 0.0 - 38.1 U/mL 11.7 14.2 CM 19.0 CM 56.1 High  CM  16.0 CM 29.4 CM 81.8 High  CM      Assessment & Plan: [name] [name] is a 67 y.o. woman with Stage IIIC presumed primary peritoneal carcinoma, low-grade. Currently on Tamoxifen.   Patient is doing well and continues to improve in terms of how she feels. She is tolerating tamoxifen very well.     Reviewed CT findings, plan to repeat CT just before her next visit with me (approximately 6-7 months after last CT).   Ca1 25 was drawn today.  I will contact her with these results.     We will plan for surveillance visit in 3 months with blood work at that time.  We reviewed signs and symptoms that should prompt a phone call between visits were reviewed.  22 minutes of total time was spent for this patient encounter, including preparation, face-to-face counseling with the patient and coordination of care, and documentation of the encounter.  Eugene Garnet, MD  Division of Gynecologic Oncology  Department of Obstetrics and Gynecology  [facility]

## 2022-11-10 NOTE — Patient Instructions (Signed)
It was good to see you today.  I do not see or feel any evidence of cancer recurrence on your exam.  I will see you for follow-up in 3 months.  We will get a CT scan right before your next visit with me.  As always, if you develop any new and concerning symptoms before your next visit, please call to see me sooner.

## 2022-11-11 LAB — CA 125: Cancer Antigen (CA) 125: 7.9 U/mL (ref 0.0–38.1)

## 2022-11-16 ENCOUNTER — Telehealth: Payer: Self-pay | Admitting: *Deleted

## 2022-11-16 NOTE — Telephone Encounter (Signed)
Spoke with [name] [name] and relayed message from [name], NP through MyChart that patient's CA 125 was within normal range and stable. Pt thanked the office for the Good News and advised to call the office with any questions or concerns.

## 2023-02-06 ENCOUNTER — Encounter (HOSPITAL_COMMUNITY): Payer: Self-pay

## 2023-02-06 ENCOUNTER — Ambulatory Visit (HOSPITAL_COMMUNITY)
Admission: RE | Admit: 2023-02-06 | Discharge: 2023-02-06 | Disposition: A | Discharge status: Home / Self Care | Payer: Medicare Other | Source: Ambulatory Visit | Attending: Gynecologic Oncology | Admitting: Gynecologic Oncology

## 2023-02-06 DIAGNOSIS — C481 Malignant neoplasm of specified parts of peritoneum: Secondary | ICD-10-CM | POA: Diagnosis present

## 2023-02-06 MED ORDER — IOHEXOL 9 MG/ML PO SOLN
ORAL | Status: AC
Start: 2023-02-06 — End: ?
  Filled 2023-02-06: qty 1000

## 2023-02-06 MED ORDER — IOHEXOL 300 MG/ML  SOLN
100.0000 mL | Freq: Once | INTRAMUSCULAR | Status: AC | PRN
  Administered 2023-02-06: 100 mL via INTRAVENOUS

## 2023-02-06 MED ORDER — IOHEXOL 9 MG/ML PO SOLN
500.0000 mL | ORAL | Status: AC
  Administered 2023-02-06: 1000 mL via ORAL

## 2023-02-08 ENCOUNTER — Other Ambulatory Visit: Payer: Self-pay | Admitting: Gynecologic Oncology

## 2023-02-08 DIAGNOSIS — C481 Malignant neoplasm of specified parts of peritoneum: Secondary | ICD-10-CM

## 2023-02-09 ENCOUNTER — Inpatient Hospital Stay: Payer: Medicare Other

## 2023-02-09 ENCOUNTER — Encounter: Payer: Self-pay | Admitting: Gynecologic Oncology

## 2023-02-09 ENCOUNTER — Inpatient Hospital Stay: Payer: Medicare Other | Attending: Gynecologic Oncology | Admitting: Gynecologic Oncology

## 2023-02-09 VITALS — BP 153/65 | HR 64 | Temp 98.4°F | Resp 16 | Wt 148.2 lb

## 2023-02-09 DIAGNOSIS — Z90710 Acquired absence of both cervix and uterus: Secondary | ICD-10-CM | POA: Insufficient documentation

## 2023-02-09 DIAGNOSIS — K589 Irritable bowel syndrome without diarrhea: Secondary | ICD-10-CM

## 2023-02-09 DIAGNOSIS — Z9221 Personal history of antineoplastic chemotherapy: Secondary | ICD-10-CM | POA: Diagnosis not present

## 2023-02-09 DIAGNOSIS — C482 Malignant neoplasm of peritoneum, unspecified: Secondary | ICD-10-CM | POA: Insufficient documentation

## 2023-02-09 DIAGNOSIS — Z923 Personal history of irradiation: Secondary | ICD-10-CM | POA: Insufficient documentation

## 2023-02-09 DIAGNOSIS — Z79810 Long term (current) use of selective estrogen receptor modulators (SERMs): Secondary | ICD-10-CM | POA: Insufficient documentation

## 2023-02-09 DIAGNOSIS — Z90722 Acquired absence of ovaries, bilateral: Secondary | ICD-10-CM | POA: Diagnosis not present

## 2023-02-09 DIAGNOSIS — C481 Malignant neoplasm of specified parts of peritoneum: Secondary | ICD-10-CM

## 2023-02-09 NOTE — Progress Notes (Signed)
Gynecologic Oncology Return Clinic Visit  02/09/23  Reason for Visit: Surveillance visit in the setting of low-grade serous carcinoma   Treatment History: Oncology History Overview Note  Low grade serous, ER positive [facility] done on 8/9: HRD not detected, MSI stable, low tumor mutational burden 1 Muts/Mb, no alterations of BRCA1 & 2   Extraovarian primary peritoneal carcinoma (HCC)  09/29/2020 Imaging   MRI lumbar spine The dominant finding in this case is that of massive retroperitoneal lymphadenopathy, likely related to either metastatic disease or lymphoma.   Chronic degenerative and postoperative findings of the spine as outlined in detail above   10/05/2020 Imaging   IMPRESSION outside CT 1. Large calcified retroperitoneal lymph nodes highly concerning for metastatic disease, likely related to primary carcinoma of the colon or lymphoma. Clinical correlation is recommended. 2. No bowel obstruction. Normal appendix. 3. Stable right hepatic dome hemangioma.   10/30/2020 PET scan   1. The densely calcified retroperitoneal adenopathy is highly hypermetabolic with maximum SUV of 23.4 (Deauville 5) there is also Deauville 3 activity in a small right axillary lymph node. Given the lack of an obvious primary outside of the retroperitoneum, appearance tends to favor lymphoma or Castleman's disease. Tissue diagnosis suggested. 2. Other imaging findings of potential clinical significance: Chronic left maxillary sinusitis. Aortic Atherosclerosis (ICD10-I70.0). Coronary atherosclerosis.   11/10/2020 Procedure   CT guided biopsy of retroperitoneal adenopathy   11/10/2020 Pathology Results   biopsy results which favor a low-grade serous carcinoma of gyn origin. This is a somewhat atypical presentation, but my suspicion is that it is primary peritoneal carcinoma   11/16/2020 Tumor Marker   Patient's tumor was tested for the following markers: CA-125. Results of the tumor marker test revealed  96.4.   12/01/2020 Pathology Results   SURGICAL PATHOLOGY  CASE: [phone]  PATIENT: [name] [name]  Surgical Pathology Report   Clinical History: Metastatic low grade serous carcinoma of gyn origin  (crm)    FINAL MICROSCOPIC DIAGNOSIS:   A. UTERUS, CERVIX, BILATERAL FALLOPIAN TUBES AND OVARIES:  Uterus:  -  Inactive endometrium  -  Leiomyoma (1.1 cm; largest)  -  Calcified nodule of the broad ligament with psammoma bodies (right)  -  No hyperplasia or malignancy identified   Cervix:  -  Benign cervix  -  No dysplasia or malignancy identified   Ovaries, bilateral:  -  Serous cyst adenofibroma with microscopic focus of borderline change  (right)    Fallopian tubes, bilateral:  -  Benign fallopian tube with paratubal cyst (right)  -  No malignancy identified   B. OMENTUM, OMENTECTOMY:  -  No carcinoma identified   C. PERIAORTIC TUMOR, EXCISION:  -  Serous carcinoma, low-grade, with psammoma bodies  -  See comment below   COMMENT:   C.  At the request of the treating clinician, immunohistochemistry was repeated.  The neoplastic cells are positive for ER, cytokeratin 7, cytokeratin 5/6, p53, PAX8 and WT1 but negative for cytokeratin 20,  TTF-1, PR and thyroglobulin.  The morphology and immunophenotype are consistent with a primary peritoneal low-grade serous carcinoma (see also, [id number]).   ONCOLOGY TABLE:   OVARY or FALLOPIAN TUBE or PRIMARY PERITONEUM: Resection   Procedure: Total hysterectomy and bilateral salpingo-oophorectomy with omentectomy and periaortic tumor excision  Specimen Integrity: Intact  Tumor Site: Primary peritoneum (see comment)  Tumor Size: See comment  Histologic Type: Low-grade serous carcinoma  Histologic Grade: Low-grade  Ovarian Surface Involvement: Not identified  Fallopian Tube Surface Involvement: Not identified  Implants (  required for advanced stage serous/seromucinous borderline  tumors only): Not applicable  Other Tissue/  Organ Involvement: Periaortic tumor  Largest Extrapelvic Peritoneal Focus: 5.5 cm (aggregate measurement)  Peritoneal/Ascitic Fluid Involvement: Not applicable  Chemotherapy Response Score (CRS): Not applicable, no known presurgical therapy  Regional Lymph Nodes: Not applicable (no lymph nodes submitted or found)  Distant Metastasis:       Distant Site(s) Involved: Not applicable  Pathologic Stage Classification (pTNM, AJCC 8th Edition): pT3, pN not assigned  Ancillary Studies: Can be performed upon request  Representative Tumor Block: C1  Comment(s): The only focus of low-grade serous carcinoma identified in the resection specimen is the tissue submitted as periaortic tumor (part C).  Clinically, this was favored to represent matted lymph nodes; however, there is no evidence of nodal tissue in the examined material. This case was discussed with Dr. Pricilla Holm and given the extent of the matted lymph nodes, this is being staged as a pT3.     12/01/2020 Surgery   Pre-operative Diagnosis: low grade serous carcinoma with significant retroperitoneal adenopathy, no other obvious imaging evidence of disease   Post-operative Diagnosis: same, significantly adherent presacral and para-aortic lymphadenopathy   Operation: Robotic-assisted laparoscopic total hysterectomy with bilateral salpingo-oophorectomy, laparotomy, infra-colic omentectomy, portion of necrotic tumor-ridden retroperitoneal lymph nodes, oversew of sigmoid serosa   Surgeon: Eugene Garnet MD   Assistant Surgeon: Antionette Char MD (an MD assistant was necessary for tissue manipulation, management of robotic instrumentation, retraction and positioning due to the complexity of the case and hospital policies).    Intra-operative consultant:  Adolphus Birchwood MD    Operative Findings: On EUA, small mobile uterus. ON intra-abdominal entry, normal liver and diaphragm. One filmy adhesion noted between the right liver lobe and the anterior  abdominal wall. The spleen was somewhat lateral and appeared mildly enlarged. There were some adhesions of the infragastric omentum to the anterior abdominal wall in the midline. The omentum was normal appearing without obvious evidence of tumor. Small and large bowel were normal appearing, appendix normal. Uterus 6cm and normal in appearance. Somewhat atrophic bilateral adnexa. There were filmy adhesions between each adnexa and the pelvic sidewall. Some filmy adhesions were also noted between the posterior uterus and rectum. Approximately 0.5-1cm white nodule within the broad ligament, lateral to the right cornua, c/w possible fibroma vs fibroid. No pelvic adenopathy. Significantly enlarged, necrotic, and tumor-filled lymph node conglomeration was noted starting within the presacral space (adherent to the sacral promontory) and extending superiorly along the great vessels to the level of the renal vessel (suspected). Given adherence of the involved lymph nodes, I could not visualize this anatomy, I could long palpate along each lateral aspect of the aorta. There was no identifiable plane between matted lymph nodes and underlying aorta or IVC. Given burden of disease and my concern that resection was not feasible, I asked my senior partner (Dr. Andrey Farmer) to evaluate intra-op. She agreed that moving forward with attempt at debulking involved lymph nodes was likely not feasible and an attempt at this time would be unsafe.   There was no obvious peritoneal disease and no clear involvement of gyn organs. Despite prior biopsy, findings at surgery remain concerning for a non-gynecologic process. If this is confirmed to be low-grade serous carcinoma of gyn origin, this was an R2 resection.    12/15/2020 Initial Diagnosis   Extraovarian primary peritoneal carcinoma (HCC)   12/15/2020 Cancer Staging   Staging form: [place], AJCC 7th Edition - Pathologic stage from 12/15/2020: Stage IIIC (T3c,  N0, cM0) - Signed by Artis Delay, MD on 12/15/2020 Staged by: Managing physician Stage prefix: Initial diagnosis   01/04/2021 Procedure   Successful placement of a power injectable Port-A-Cath via the right internal jugular vein. The catheter is ready for immediate use.   01/08/2021 - 02/19/2021 Chemotherapy    Patient is on Treatment Plan: OVARIAN CARBOPLATIN (AUC 6) / PACLITAXEL (175) Q21D X 6 CYCLES       01/29/2021 Tumor Marker   Patient's tumor was tested for the following markers: CA-125. Results of the tumor marker test revealed 59.   02/05/2021 Imaging   Right:  No evidence of superficial vein thrombosis in the upper extremity.  Findings  consistent with acute deep vein thrombosis involving the right internal jugular vein.     Left:  No evidence of thrombosis in the subclavian.       03/10/2021 Tumor Marker   Patient's tumor was tested for the following markers: CA-125. Results of the tumor marker test revealed 81.8.   03/11/2021 Imaging   Stable bulky calcified retroperitoneal lymphadenopathy, consistent with metastatic disease.   Stable right hepatic lobe lesion, likely representing a benign hemangioma. This shoeds no FDG uptake on previous PET-CT scan.   No evidence of new or progressive disease within the abdomen or pelvis.   Aortic Atherosclerosis (ICD10-I70.0).   04/06/2021 Procedure   Successful removal of implanted Port-A-Cath.   04/29/2021 Tumor Marker   Patient's tumor was tested for the following markers: CA-125. Results of the tumor marker test revealed 29.4.   05/22/2021 Genetic Testing   Negative hereditary cancer genetic testing: no pathogenic variants detected in [facility] Multi-Cancer +RNA Panel.  The report date is 05/22/2021.   The Multi-Cancer + RNA Panel offered by [facility] includes sequencing and/or deletion/duplication analysis of the following 84 genes:  AIP*, ALK, APC*, ATM*, AXIN2*, BAP1*, BARD1*, BLM*, BMPR1A*, BRCA1*, BRCA2*, BRIP1*, CASR, CDC73*, CDH1*, CDK4, CDKN1B*,  CDKN1C*, CDKN2A, CEBPA, CHEK2*, CTNNA1*, DICER1*, DIS3L2*, EGFR, EPCAM, FH*, FLCN*, GATA2*, GPC3, GREM1, HOXB13, HRAS, KIT, MAX*, MEN1*, MET, MITF, MLH1*, MSH2*, MSH3*, MSH6*, MUTYH*, NBN*, NF1*, NF2*, NTHL1*, PALB2*, PDGFRA, PHOX2B, PMS2*, POLD1*, POLE*, POT1*, PRKAR1A*, PTCH1*, PTEN*, RAD50*, RAD51C*, RAD51D*, RB1*, RECQL4, RET, RUNX1*, SDHA*, SDHAF2*, SDHB*, SDHC*, SDHD*, SMAD4*, SMARCA4*, SMARCB1*, SMARCE1*, STK11*, SUFU*, TERC, TERT, TMEM127*, Tp53*, TSC1*, TSC2*, VHL*, WRN*, and WT1.  RNA analysis is performed for * genes.   07/08/2021 Tumor Marker   Patient's tumor was tested for the following markers: CA-125. Results of the tumor marker test revealed 16.   07/12/2021 Imaging   CT A/P 1. Mild decrease in size of isolated calcified abdominal retroperitoneal nodal metastasis. 2. No new or progressive disease. 3. Similar high right hepatic lobe lesion, favoring a hemangioma. 4.  Aortic Atherosclerosis (ICD10-I70.0).   09/13/2021 Tumor Marker   Patient's tumor was tested for the following markers: CA-125. Results of the tumor marker test revealed 56.1.   09/27/2021 Imaging   CT A/P 1. Slight decreased size calcified retroperitoneal lymph nodes. No new or progressive disease. 2. Stable lesion of the right hepatic lobe, likely a hemangioma. 3. Aortic Atherosclerosis (ICD10-I70.0).   12/28/2021 Imaging   CT A/P 1. Calcified abdominal adenopathy is minimally decreased in size since 09/28/2021, most consistent with response to therapy. 2. No new or progressive disease. 3. Similar borderline to mild common duct dilatation, without cause identified. This could be correlated with bilirubin level. 4.  Aortic Atherosclerosis (ICD10-I70.0).   12/31/2021 Tumor Marker   Patient's tumor was tested for the following markers: CA-125. Results  of the tumor marker test revealed 19.   04/01/2022 Tumor Marker   Patient's tumor was tested for the following markers: CA-125. Results of the tumor marker test  revealed 14.2.    Interval History: Doing well.  Denies any significant abdominal pain.  Continues to have her baseline GI issues, no recent changes.  Denies any vaginal bleeding or discharge.  Past Medical/Surgical History: Past Medical History:  Diagnosis Date   Arthritis    Chronic pain    Contact lens/glasses fitting    wears contacts or glasses   Depression    Family history of melanoma 04/29/2021   Fibromyalgia    Melanoma (HCC)    Peritoneal carcinoma (HCC)    PONV (postoperative nausea and vomiting)    prolonged sedation very hard to wake    Past Surgical History:  Procedure Laterality Date   BACK SURGERY     lumb lam   BREAST BIOPSY Right    2021   BREAST BIOPSY Left    BREAST ENHANCEMENT SURGERY  05/30/1980   implants    BREAST IMPLANT EXCHANGE  05/30/2009   CARPAL TUNNEL RELEASE  05/30/2001   rt and lt   CARPOMETACARPEL SUSPENSION PLASTY  06/22/2012   Procedure: CARPOMETACARPEL (CMC) SUSPENSION PLASTY;  Surgeon: Harvie Junior, MD;  Location: [facility];  Service: Orthopedics;  Laterality: Left;  burton's interposition arthroplasty left thumb    CATARACT EXTRACTION W/ INTRAOCULAR LENS  IMPLANT, BILATERAL     COLONOSCOPY     x2   DIAGNOSTIC LAPAROSCOPY  05/30/2000   expl lap   DILATION AND CURETTAGE OF UTERUS  05/30/2001   ablation   ELBOW SURGERY  05/31/1999   decompression rt   EPIDURAL BLOCK INJECTION     multiple   IR IMAGING GUIDED PORT INSERTION  01/04/2021   IR REMOVAL TUN ACCESS W/ PORT W/O FL MOD SED  04/06/2021   LYMPH NODE DISSECTION N/A 12/01/2020   Procedure: LYMPH NODE DISSECTION;  Surgeon: Carver Fila, MD;  Location: [facility];  Service: Gynecology;  Laterality: N/A;   SKIN CANCER EXCISION     melanoma, from abdomen   TONSILLECTOMY      Family History  Problem Relation Age of Onset   Melanoma Mother        behind ear; on leg; dx after 50   Basal cell carcinoma Mother        face   Lung cancer Father 47       smoking  hx   Leukemia Maternal Grandmother        dx after 24   Lung cancer Cousin        maternal female cousin; dx after 60; smoking hx   Cancer Cousin        paternal female cousin; unknown type; dx after 26   Colon cancer Neg Hx    Breast cancer Neg Hx    Ovarian cancer Neg Hx    Endometrial cancer Neg Hx    Pancreatic cancer Neg Hx    Prostate cancer Neg Hx     Social History   Socioeconomic History   Marital status: Married    Spouse name: Not on file   Number of children: Not on file   Years of education: Not on file   Highest education level: Not on file  Occupational History   Not on file  Tobacco Use   Smoking status: Never   Smokeless tobacco: Never  Vaping Use   Vaping status: Never  Used  Substance and Sexual Activity   Alcohol use: Yes    Comment: rare   Drug use: No   Sexual activity: Yes    Partners: Male    Birth control/protection: Post-menopausal  Other Topics Concern   Not on file  Social History Narrative   Not on file   Social Determinants of Health   Financial Resource Strain: Not on file  Food Insecurity: Low Risk  (12/05/2022)   Received from [facility]   Hunger Vital Sign    Worried About Running Out of Food in the Last Year: Never true    Ran Out of Food in the Last Year: Never true  Transportation Needs: Not on file (12/05/2022)  Physical Activity: Not on file  Stress: Not on file  Social Connections: Not on file    Current Medications:  Current Outpatient Medications:    cholecalciferol (VITAMIN D3) 25 MCG (1000 UNIT) tablet, Take 1,000 Units by mouth daily., Disp: , Rfl:    estradiol (ESTRACE VAGINAL) 0.1 MG/GM vaginal cream, Place 1 Applicatorful vaginally 3 (three) times a week., Disp: 42.5 g, Rfl: 12   FLUoxetine (PROZAC) 20 MG capsule, Take 20 mg by mouth every morning., Disp: , Rfl:    Omega-3 Fatty Acids (FISH OIL BURP-LESS) 1000 MG CAPS, Take by mouth., Disp: , Rfl:    tamoxifen (NOLVADEX) 20 MG tablet, Take 1 tablet (20 mg  total) by mouth 2 (two) times daily., Disp: 180 tablet, Rfl: 3   traZODone (DESYREL) 25 mg TABS tablet, Take 25 mg by mouth at bedtime., Disp: , Rfl:   Review of Systems: Denies appetite changes, fevers, chills, fatigue, unexplained weight changes. Denies hearing loss, neck lumps or masses, mouth sores, ringing in ears or voice changes. Denies cough or wheezing.  Denies shortness of breath. Denies chest pain or palpitations. Denies leg swelling. Denies abdominal distention, pain, blood in stools, constipation, diarrhea, nausea, vomiting, or early satiety. Denies pain with intercourse, dysuria, frequency, hematuria or incontinence. Denies hot flashes, pelvic pain, vaginal bleeding or vaginal discharge.   Denies joint pain, back pain or muscle pain/cramps. Denies itching, rash, or wounds. Denies dizziness, headaches, numbness or seizures. Denies swollen lymph nodes or glands, denies easy bruising or bleeding. Denies anxiety, depression, confusion, or decreased concentration.  Physical Exam: BP (!) 153/65 (BP Location: Left Arm, Patient Position: Sitting) Comment: 153/65 L side; 154/68 R side  Pulse 64   Temp 98.4 F (36.9 C) (Oral)   Resp 16   Wt 148 lb 3.2 oz (67.2 kg)   SpO2 100%   BMI 27.31 kg/m  General: Alert, oriented, no acute distress. HEENT: Normocephalic, atraumatic, sclera anicteric. Chest: Clear to auscultation bilaterally.  No wheezes or rhonchi. Cardiovascular: Regular rate and rhythm, no murmurs. Abdomen: soft, nontender.  Normoactive bowel sounds.  No masses or hepatosplenomegaly appreciated.  Well-healed incisions. Extremities: Grossly normal range of motion.  Warm, well perfused.  No edema bilaterally. Skin: No rashes or lesions noted. Lymphatics: No cervical, supraclavicular, or inguinal adenopathy. GU: Normal appearing external genitalia without erythema, excoriation, or lesions.  Speculum exam reveals mildly atrophic vaginal mucosa, no lesions or masses noted.   Cuff intact.  Bimanual exam reveals no masses or nodularity.  Rectovaginal exam confirms the findings.  Laboratory & Radiologic Studies:          Component Ref Range & Units 3 mo ago (11/10/22) 6 mo ago (07/15/22) 10 mo ago (04/01/22) 1 yr ago (12/31/21) 1 yr ago (09/13/21) 1 yr ago (07/07/21) 1 yr  ago (04/29/21)  Cancer Antigen (CA) 125 0.0 - 38.1 U/mL 7.9 11.7 CM 14.2 CM 19.0 CM 56.1 High  CM 16.0 CM 29.4 C     CT A/P on 9/9:  1. Stable examination.  No evidence of progressive disease. 2. No significant change in calcified conglomerate aortocaval nodal mass consistent with treated metastatic disease. 3. Stable chronic extrahepatic biliary dilatation which may relate to a periampullary duodenal diverticulum. 4.  Aortic Atherosclerosis (ICD10-I70.0).  Assessment & Plan: [name] [name] is a 67 y.o. woman with Stage IIIC presumed primary peritoneal carcinoma, low-grade. Completed adjuvant chemotherapy in 01/2021.  Currently on Tamoxifen.   Patient is doing well and continues to improve in terms of how she feels. She is tolerating tamoxifen very well.     CT A/P shows stable disease versus calcified treated disease, no new areas of metastatic disease. CA125 was drawn today.  I will contact her with these results.     We will plan for surveillance visit in 3 months with blood work at that time.  We reviewed signs and symptoms that should prompt a phone call between visits were reviewed.  22 minutes of total time was spent for this patient encounter, including preparation, face-to-face counseling with the patient and coordination of care, and documentation of the encounter.  Eugene Garnet, MD  Division of Gynecologic Oncology  Department of Obstetrics and Gynecology  [facility]

## 2023-02-09 NOTE — Patient Instructions (Signed)
It was good to see you today.  I do not see or feel any evidence of cancer recurrence on your exam.  I will see you for follow-up in 3 months.  As always, if you develop any new and concerning symptoms before your next visit, please call to see me sooner.  

## 2023-02-11 LAB — CA 125: Cancer Antigen (CA) 125: 7.9 U/mL (ref 0.0–38.1)

## 2023-02-14 ENCOUNTER — Telehealth: Payer: Self-pay | Admitting: *Deleted

## 2023-02-14 NOTE — Telephone Encounter (Signed)
Spoke with [name] and relayed message that her CA 125 is stable and within normal range at 7.9. Pt verbalized understanding and thanked the office for calling.

## 2023-02-14 NOTE — Telephone Encounter (Signed)
-----   Message from Doylene Bode sent at 02/14/2023 10:23 AM EDT ----- Please let her know her CA 125 is stable and within normal range at 7.9 ----- Message ----- From: Interface, [facility] [place] Sent: 02/11/2023   9:40 AM EDT To: Doylene Bode, NP

## 2023-03-16 ENCOUNTER — Telehealth: Payer: Self-pay | Admitting: *Deleted

## 2023-03-16 NOTE — Telephone Encounter (Signed)
Spoke with the patient and moved appt from 12/13 to 12/27

## 2023-05-12 ENCOUNTER — Ambulatory Visit: Payer: Medicare Other | Admitting: Gynecologic Oncology

## 2023-05-25 ENCOUNTER — Other Ambulatory Visit: Payer: Self-pay | Admitting: Gynecologic Oncology

## 2023-05-25 DIAGNOSIS — C481 Malignant neoplasm of specified parts of peritoneum: Secondary | ICD-10-CM

## 2023-05-26 ENCOUNTER — Inpatient Hospital Stay: Payer: Medicare Other | Attending: Gynecologic Oncology

## 2023-05-26 ENCOUNTER — Encounter: Payer: Self-pay | Admitting: Gynecologic Oncology

## 2023-05-26 ENCOUNTER — Inpatient Hospital Stay (HOSPITAL_BASED_OUTPATIENT_CLINIC_OR_DEPARTMENT_OTHER): Payer: Medicare Other | Admitting: Gynecologic Oncology

## 2023-05-26 VITALS — BP 126/63 | HR 72 | Temp 97.8°F | Resp 18 | Ht 61.77 in | Wt 148.0 lb

## 2023-05-26 DIAGNOSIS — Z90710 Acquired absence of both cervix and uterus: Secondary | ICD-10-CM | POA: Diagnosis not present

## 2023-05-26 DIAGNOSIS — Z9079 Acquired absence of other genital organ(s): Secondary | ICD-10-CM | POA: Diagnosis not present

## 2023-05-26 DIAGNOSIS — Z90722 Acquired absence of ovaries, bilateral: Secondary | ICD-10-CM | POA: Insufficient documentation

## 2023-05-26 DIAGNOSIS — C482 Malignant neoplasm of peritoneum, unspecified: Secondary | ICD-10-CM

## 2023-05-26 DIAGNOSIS — M8580 Other specified disorders of bone density and structure, unspecified site: Secondary | ICD-10-CM | POA: Diagnosis not present

## 2023-05-26 DIAGNOSIS — Z79810 Long term (current) use of selective estrogen receptor modulators (SERMs): Secondary | ICD-10-CM | POA: Diagnosis not present

## 2023-05-26 DIAGNOSIS — C481 Malignant neoplasm of specified parts of peritoneum: Secondary | ICD-10-CM

## 2023-05-26 NOTE — Patient Instructions (Signed)
It was good to see you today.  I do not see or feel any evidence of cancer recurrence on your exam.  I will see you for follow-up in 4 months.  We will get a bone density scan now.  Plan for a CT scan when I see you at your next visit.  As always, if you develop any new and concerning symptoms before your next visit, please call to see me sooner.

## 2023-05-26 NOTE — Progress Notes (Signed)
Gynecologic Oncology Return Clinic Visit  05/26/23  Reason for Visit: Surveillance visit in the setting of low-grade serous carcinoma   Treatment History: Oncology History Overview Note  Low grade serous, ER positive [facility] One done on 8/9: HRD not detected, MSI stable, low tumor mutational burden 1 Muts/Mb, no alterations of BRCA1 & 2   Extraovarian primary peritoneal carcinoma (HCC)  09/29/2020 Imaging   MRI lumbar spine The dominant finding in this case is that of massive retroperitoneal lymphadenopathy, likely related to either metastatic disease or lymphoma.   Chronic degenerative and postoperative findings of the spine as outlined in detail above   10/05/2020 Imaging   IMPRESSION outside CT 1. Large calcified retroperitoneal lymph nodes highly concerning for metastatic disease, likely related to primary carcinoma of the colon or lymphoma. Clinical correlation is recommended. 2. No bowel obstruction. Normal appendix. 3. Stable right hepatic dome hemangioma.   10/30/2020 PET scan   1. The densely calcified retroperitoneal adenopathy is highly hypermetabolic with maximum SUV of 23.4 (Deauville 5) there is also Deauville 3 activity in a small right axillary lymph node. Given the lack of an obvious primary outside of the retroperitoneum, appearance tends to favor lymphoma or Castleman's disease. Tissue diagnosis suggested. 2. Other imaging findings of potential clinical significance: Chronic left maxillary sinusitis. Aortic Atherosclerosis (ICD10-I70.0). Coronary atherosclerosis.   11/10/2020 Procedure   CT guided biopsy of retroperitoneal adenopathy   11/10/2020 Pathology Results   biopsy results which favor a low-grade serous carcinoma of gyn origin. This is a somewhat atypical presentation, but my suspicion is that it is primary peritoneal carcinoma   11/16/2020 Tumor Marker   Patient's tumor was tested for the following markers: CA-125. Results of the tumor marker test revealed  96.4.   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 UTERUS, CERVIX, BILATERAL FALLOPIAN TUBES AND OVARIES:  Uterus:  -  Inactive endometrium  -  Leiomyoma (1.1 cm; largest)  -  Calcified nodule of the broad ligament with psammoma bodies (right)  -  No hyperplasia or malignancy identified   Cervix:  -  Benign cervix  -  No dysplasia or malignancy identified   Ovaries, bilateral:  -  Serous cyst adenofibroma with microscopic focus of borderline change  (right)    Fallopian tubes, bilateral:  -  Benign fallopian tube with paratubal cyst (right)  -  No malignancy identified   B. OMENTUM, OMENTECTOMY:  -  No carcinoma identified   C. PERIAORTIC TUMOR, EXCISION:  -  Serous carcinoma, low-grade, with psammoma bodies  -  See comment below   COMMENT:   C.  At the request of the treating clinician, immunohistochemistry was repeated.  The neoplastic cells are positive for ER, cytokeratin 7, cytokeratin 5/6, p53, PAX8 and WT1 but negative for cytokeratin 20,  TTF-1, PR and thyroglobulin.  The morphology and immunophenotype are consistent with a primary peritoneal low-grade serous carcinoma (see also, [id number]).   ONCOLOGY TABLE:   OVARY or FALLOPIAN TUBE or PRIMARY PERITONEUM: Resection   Procedure: Total hysterectomy and bilateral salpingo-oophorectomy with omentectomy and periaortic tumor excision  Specimen Integrity: Intact  Tumor Site: Primary peritoneum (see comment)  Tumor Size: See comment  Histologic Type: Low-grade serous carcinoma  Histologic Grade: Low-grade  Ovarian Surface Involvement: Not identified  Fallopian Tube Surface Involvement: Not identified  Implants (  required for advanced stage serous/seromucinous borderline  tumors only): Not applicable  Other Tissue/  Organ Involvement: Periaortic tumor  Largest Extrapelvic Peritoneal Focus: 5.5 cm (aggregate measurement)  Peritoneal/Ascitic Fluid Involvement: Not applicable  Chemotherapy Response Score (CRS): Not applicable, no known presurgical therapy  Regional Lymph Nodes: Not applicable (no lymph nodes submitted or found)  Distant Metastasis:       Distant Site(s) Involved: Not applicable  Pathologic Stage Classification (pTNM, AJCC 8th Edition): pT3, pN not assigned  Ancillary Studies: Can be performed upon request  Representative Tumor Block: C1  Comment(s): The only focus of low-grade serous carcinoma identified in the resection specimen is the tissue submitted as periaortic tumor (part C).  Clinically, this was favored to represent matted lymph nodes; however, there is no evidence of nodal tissue in the examined material. This case was discussed with Dr. Pricilla Holm and given the extent of the matted lymph nodes, this is being staged as a pT3.     12/01/2020 Surgery   Pre-operative Diagnosis: low grade serous carcinoma with significant retroperitoneal adenopathy, no other obvious imaging evidence of disease   Post-operative Diagnosis: same, significantly adherent presacral and para-aortic lymphadenopathy   Operation: Robotic-assisted laparoscopic total hysterectomy with bilateral salpingo-oophorectomy, laparotomy, infra-colic omentectomy, portion of necrotic tumor-ridden retroperitoneal lymph nodes, oversew of sigmoid serosa   Surgeon: Eugene Garnet MD   Assistant Surgeon: Antionette Char MD (an MD assistant was necessary for tissue manipulation, management of robotic instrumentation, retraction and positioning due to the complexity of the case and hospital policies).    Intra-operative consultant:  Adolphus Birchwood MD    Operative Findings: On EUA, small mobile uterus. ON intra-abdominal entry, normal liver and diaphragm. One filmy adhesion noted between the right liver lobe and the anterior  abdominal wall. The spleen was somewhat lateral and appeared mildly enlarged. There were some adhesions of the infragastric omentum to the anterior abdominal wall in the midline. The omentum was normal appearing without obvious evidence of tumor. Small and large bowel were normal appearing, appendix normal. Uterus 6cm and normal in appearance. Somewhat atrophic bilateral adnexa. There were filmy adhesions between each adnexa and the pelvic sidewall. Some filmy adhesions were also noted between the posterior uterus and rectum. Approximately 0.5-1cm white nodule within the broad ligament, lateral to the right cornua, c/w possible fibroma vs fibroid. No pelvic adenopathy. Significantly enlarged, necrotic, and tumor-filled lymph node conglomeration was noted starting within the presacral space (adherent to the sacral promontory) and extending superiorly along the great vessels to the level of the renal vessel (suspected). Given adherence of the involved lymph nodes, I could not visualize this anatomy, I could long palpate along each lateral aspect of the aorta. There was no identifiable plane between matted lymph nodes and underlying aorta or IVC. Given burden of disease and my concern that resection was not feasible, I asked my senior partner (Dr. Andrey Farmer) to evaluate intra-op. She agreed that moving forward with attempt at debulking involved lymph nodes was likely not feasible and an attempt at this time would be unsafe.   There was no obvious peritoneal disease and no clear involvement of gyn organs. Despite prior biopsy, findings at surgery remain concerning for a non-gynecologic process. If this is confirmed to be low-grade serous carcinoma of gyn origin, this was an R2 resection.    12/15/2020 Initial Diagnosis   Extraovarian primary peritoneal carcinoma (HCC)   12/15/2020 Cancer Staging   Staging form: [place]vary, AJCC 7th Edition - Pathologic stage from 12/15/2020: Stage IIIC (T3c,  N0, cM0) - Signed by Artis Delay, MD on 12/15/2020 Staged by: Managing physician Stage prefix: Initial diagnosis   01/04/2021 Procedure   Successful placement of a power injectable Port-A-Cath via the right internal jugular vein. The catheter is ready for immediate use.   01/08/2021 - 02/19/2021 Chemotherapy    Patient is on Treatment Plan: OVARIAN CARBOPLATIN (AUC 6) / PACLITAXEL (175) Q21D X 6 CYCLES       01/29/2021 Tumor Marker   Patient's tumor was tested for the following markers: CA-125. Results of the tumor marker test revealed 59.   02/05/2021 Imaging   Right:  No evidence of superficial vein thrombosis in the upper extremity.  Findings  consistent with acute deep vein thrombosis involving the right internal jugular vein.     Left:  No evidence of thrombosis in the subclavian.       03/10/2021 Tumor Marker   Patient's tumor was tested for the following markers: CA-125. Results of the tumor marker test revealed 81.8.   03/11/2021 Imaging   Stable bulky calcified retroperitoneal lymphadenopathy, consistent with metastatic disease.   Stable right hepatic lobe lesion, likely representing a benign hemangioma. This shoeds no FDG uptake on previous PET-CT scan.   No evidence of new or progressive disease within the abdomen or pelvis.   Aortic Atherosclerosis (ICD10-I70.0).   04/06/2021 Procedure   Successful removal of implanted Port-A-Cath.   04/29/2021 Tumor Marker   Patient's tumor was tested for the following markers: CA-125. Results of the tumor marker test revealed 29.4.   05/22/2021 Genetic Testing   Negative hereditary cancer genetic testing: no pathogenic variants detected in [facility] Multi-Cancer +RNA Panel.  The report date is 05/22/2021.   The Multi-Cancer + RNA Panel offered by [facility] includes sequencing and/or deletion/duplication analysis of the following 84 genes:  AIP*, ALK, APC*, ATM*, AXIN2*, BAP1*, BARD1*, BLM*, BMPR1A*, BRCA1*, BRCA2*, BRIP1*, CASR, CDC73*, CDH1*, CDK4, CDKN1B*,  CDKN1C*, CDKN2A, CEBPA, CHEK2*, CTNNA1*, DICER1*, DIS3L2*, EGFR, EPCAM, FH*, FLCN*, GATA2*, GPC3, GREM1, HOXB13, HRAS, KIT, MAX*, MEN1*, MET, MITF, MLH1*, MSH2*, MSH3*, MSH6*, MUTYH*, NBN*, NF1*, NF2*, NTHL1*, PALB2*, PDGFRA, PHOX2B, PMS2*, POLD1*, POLE*, POT1*, PRKAR1A*, PTCH1*, PTEN*, RAD50*, RAD51C*, RAD51D*, RB1*, RECQL4, RET, RUNX1*, SDHA*, SDHAF2*, SDHB*, SDHC*, SDHD*, SMAD4*, SMARCA4*, SMARCB1*, SMARCE1*, STK11*, SUFU*, TERC, TERT, TMEM127*, Tp53*, TSC1*, TSC2*, VHL*, WRN*, and WT1.  RNA analysis is performed for * genes.   07/08/2021 Tumor Marker   Patient's tumor was tested for the following markers: CA-125. Results of the tumor marker test revealed 16.   07/12/2021 Imaging   CT A/P 1. Mild decrease in size of isolated calcified abdominal retroperitoneal nodal metastasis. 2. No new or progressive disease. 3. Similar high right hepatic lobe lesion, favoring a hemangioma. 4.  Aortic Atherosclerosis (ICD10-I70.0).   09/13/2021 Tumor Marker   Patient's tumor was tested for the following markers: CA-125. Results of the tumor marker test revealed 56.1.   09/27/2021 Imaging   CT A/P 1. Slight decreased size calcified retroperitoneal lymph nodes. No new or progressive disease. 2. Stable lesion of the right hepatic lobe, likely a hemangioma. 3. Aortic Atherosclerosis (ICD10-I70.0).   12/28/2021 Imaging   CT A/P 1. Calcified abdominal adenopathy is minimally decreased in size since 09/28/2021, most consistent with response to therapy. 2. No new or progressive disease. 3. Similar borderline to mild common duct dilatation, without cause identified. This could be correlated with bilirubin level. 4.  Aortic Atherosclerosis (ICD10-I70.0).   12/31/2021 Tumor Marker   Patient's tumor was tested for the following markers: CA-125. Results  of the tumor marker test revealed 19.   04/01/2022 Tumor Marker   Patient's tumor was tested for the following markers: CA-125. Results of the tumor marker test  revealed 14.2.     Interval History: Doing well.  Denies any abdominal or pelvic pain.  Denies vaginal bleeding.  Has some back pain.  Had another area of melanoma removed from her left buttock since her visit with me.  Think she may be lactose intolerant.  She develops diarrhea after eating dairy, especially cheese.  Urinary function at baseline, takes [place] with some improvement.  Past Medical/Surgical History: Past Medical History:  Diagnosis Date   Arthritis    Chronic pain    Contact lens/glasses fitting    wears contacts or glasses   Depression    Family history of melanoma 04/29/2021   Fibromyalgia    Melanoma (HCC)    Peritoneal carcinoma (HCC)    PONV (postoperative nausea and vomiting)    prolonged sedation very hard to wake    Past Surgical History:  Procedure Laterality Date   BACK SURGERY     lumb lam   BREAST BIOPSY Right    2021   BREAST BIOPSY Left    BREAST ENHANCEMENT SURGERY  05/30/1980   implants    BREAST IMPLANT EXCHANGE  05/30/2009   CARPAL TUNNEL RELEASE  05/30/2001   rt and lt   CARPOMETACARPEL SUSPENSION PLASTY  06/22/2012   Procedure: CARPOMETACARPEL (CMC) SUSPENSION PLASTY;  Surgeon: Harvie Junior, MD;  Location: [facility];  Service: Orthopedics;  Laterality: Left;  burton's interposition arthroplasty left thumb    CATARACT EXTRACTION W/ INTRAOCULAR LENS  IMPLANT, BILATERAL     COLONOSCOPY     x2   DIAGNOSTIC LAPAROSCOPY  05/30/2000   expl lap   DILATION AND CURETTAGE OF UTERUS  05/30/2001   ablation   ELBOW SURGERY  05/31/1999   decompression rt   EPIDURAL BLOCK INJECTION     multiple   IR IMAGING GUIDED PORT INSERTION  01/04/2021   IR REMOVAL TUN ACCESS W/ PORT W/O FL MOD SED  04/06/2021   LYMPH NODE DISSECTION N/A 12/01/2020   Procedure: LYMPH NODE DISSECTION;  Surgeon: Carver Fila, MD;  Location: [facility];  Service: Gynecology;  Laterality: N/A;   SKIN CANCER EXCISION     melanoma, from abdomen   TONSILLECTOMY       Family History  Problem Relation Age of Onset   Melanoma Mother        behind ear; on leg; dx after 50   Basal cell carcinoma Mother        face   Lung cancer Father 54       smoking hx   Leukemia Maternal Grandmother        dx after 71   Lung cancer Cousin        maternal female cousin; dx after 70; smoking hx   Cancer Cousin        paternal female cousin; unknown type; dx after 18   Colon cancer Neg Hx    Breast cancer Neg Hx    Ovarian cancer Neg Hx    Endometrial cancer Neg Hx    Pancreatic cancer Neg Hx    Prostate cancer Neg Hx     Social History   Socioeconomic History   Marital status: Married    Spouse name: Not on file   Number of children: Not on file   Years of education: Not on file  Highest education level: Not on file  Occupational History   Not on file  Tobacco Use   Smoking status: Never   Smokeless tobacco: Never  Vaping Use   Vaping status: Never Used  Substance and Sexual Activity   Alcohol use: Yes    Comment: rare   Drug use: No   Sexual activity: Yes    Partners: Male    Birth control/protection: Post-menopausal  Other Topics Concern   Not on file  Social History Narrative   Not on file   Social Drivers of Health   Financial Resource Strain: Not on file  Food Insecurity: Low Risk  (12/05/2022)   Received from [facility]   Hunger Vital Sign    Worried About Running Out of Food in the Last Year: Never true    Ran Out of Food in the Last Year: Never true  Transportation Needs: Not on file (12/05/2022)  Physical Activity: Not on file  Stress: Not on file  Social Connections: Not on file    Current Medications:  Current Outpatient Medications:    cholecalciferol (VITAMIN D3) 25 MCG (1000 UNIT) tablet, Take 1,000 Units by mouth daily., Disp: , Rfl:    estradiol (ESTRACE VAGINAL) 0.1 MG/GM vaginal cream, Place 1 Applicatorful vaginally 3 (three) times a week., Disp: 42.5 g, Rfl: 12   FLUoxetine (PROZAC) 20 MG capsule, Take 20 mg  by mouth every morning., Disp: , Rfl:    Omega-3 Fatty Acids (FISH OIL BURP-LESS) 1000 MG CAPS, Take by mouth., Disp: , Rfl:    traZODone (DESYREL) 25 mg TABS tablet, Take 25 mg by mouth at bedtime., Disp: , Rfl:    Vibegron (GEMTESA) 75 MG TABS, Take 1 tablet by mouth daily., Disp: , Rfl:    tamoxifen (NOLVADEX) 20 MG tablet, TAKE 1 TABLET BY MOUTH 2 TIMES DAILY., Disp: 180 tablet, Rfl: 3  Review of Systems: + back pain Denies appetite changes, fevers, chills, fatigue, unexplained weight changes. Denies hearing loss, neck lumps or masses, mouth sores, ringing in ears or voice changes. Denies cough or wheezing.  Denies shortness of breath. Denies chest pain or palpitations. Denies leg swelling. Denies abdominal distention, pain, blood in stools, constipation, diarrhea, nausea, vomiting, or early satiety. Denies pain with intercourse, dysuria, frequency, hematuria or incontinence. Denies hot flashes, pelvic pain, vaginal bleeding or vaginal discharge.   Denies joint pain or muscle pain/cramps. Denies itching, rash, or wounds. Denies dizziness, headaches, numbness or seizures. Denies swollen lymph nodes or glands, denies easy bruising or bleeding. Denies anxiety, depression, confusion, or decreased concentration.  Physical Exam: BP 126/63 (BP Location: Left Arm, Patient Position: Sitting, Cuff Size: Normal)   Pulse 72   Temp 97.8 F (36.6 C) (Oral)   Resp 18   Ht 5' 1.77" (1.569 m)   Wt 148 lb (67.1 kg)   SpO2 98%   BMI 27.27 kg/m  General: Alert, oriented, no acute distress. HEENT: Normocephalic, atraumatic, sclera anicteric. Chest: Clear to auscultation bilaterally.  No wheezes or rhonchi. Cardiovascular: Regular rate and rhythm, no murmurs. Abdomen: soft, nontender.  Normoactive bowel sounds.  No masses or hepatosplenomegaly appreciated.  Well-healed incisions. Extremities: Grossly normal range of motion.  Warm, well perfused.  No edema bilaterally. Skin: No rashes or lesions  noted. Lymphatics: No cervical, supraclavicular, or inguinal adenopathy. GU: Normal appearing external genitalia without erythema, excoriation, or lesions.  Speculum exam reveals mildly atrophic vaginal mucosa, no lesions or masses noted.  Cuff intact.  Bimanual exam reveals no masses or nodularity.  Laboratory & Radiologic Studies:          Component Ref Range & Units (hover) 3 mo ago (02/09/23) 6 mo ago (11/10/22) 10 mo ago (07/15/22) 1 yr ago (04/01/22) 1 yr ago (12/31/21) 1 yr ago (09/13/21) 1 yr ago (07/07/21)  Cancer Antigen (CA) 125 7.9 7.9 CM 11.7 CM 14.2 CM 19.0 CM 56.1 High  CM 16.0 CM      Assessment & Plan: [name] [name] is a 67 y.o. woman with Stage IIIC presumed primary peritoneal carcinoma, low-grade. Completed adjuvant chemotherapy in 01/2021.  Currently on Tamoxifen. Last CT in 01/2023 showed stable findings (5.1 x 3.6 x 1.7 cm calcified conglomerate aortocaval nodal mass), no evidence of progression.  Patient is doing well and continues to improve in terms of how she feels. She is tolerating tamoxifen very well.     CA125 was drawn today.    Plan for updated bone density scan (last 03/2021, showed osteopenia).   We will plan for surveillance visit in 4 months with blood work at that time.  Plan for next imaging 6-9 months after last CT. We reviewed signs and symptoms that should prompt a phone call between visits were reviewed.  22 minutes of total time was spent for this patient encounter, including preparation, face-to-face counseling with the patient and coordination of care, and documentation of the encounter.  Eugene Garnet, MD  Division of Gynecologic Oncology  Department of Obstetrics and Gynecology  [facility]

## 2023-05-27 LAB — CA 125: Cancer Antigen (CA) 125: 8.4 U/mL (ref 0.0–38.1)

## 2023-06-15 ENCOUNTER — Ambulatory Visit (HOSPITAL_BASED_OUTPATIENT_CLINIC_OR_DEPARTMENT_OTHER)
Admission: RE | Admit: 2023-06-15 | Discharge: 2023-06-15 | Disposition: A | Discharge status: Home / Self Care | Payer: Medicare Other | Source: Ambulatory Visit | Attending: Gynecologic Oncology | Admitting: Gynecologic Oncology

## 2023-06-15 DIAGNOSIS — M8589 Other specified disorders of bone density and structure, multiple sites: Secondary | ICD-10-CM | POA: Diagnosis not present

## 2023-06-15 DIAGNOSIS — Z853 Personal history of malignant neoplasm of breast: Secondary | ICD-10-CM | POA: Diagnosis not present

## 2023-06-15 DIAGNOSIS — Z13820 Encounter for screening for osteoporosis: Secondary | ICD-10-CM | POA: Diagnosis present

## 2023-06-15 DIAGNOSIS — Z780 Asymptomatic menopausal state: Secondary | ICD-10-CM | POA: Diagnosis not present

## 2023-06-15 DIAGNOSIS — M8580 Other specified disorders of bone density and structure, unspecified site: Secondary | ICD-10-CM

## 2023-06-16 ENCOUNTER — Telehealth: Payer: Self-pay | Admitting: *Deleted

## 2023-06-16 NOTE — Telephone Encounter (Signed)
Spoke with [name] and relayed message from Dr. Pricilla Holm that patient's bone scan still shows osteopenia (stable from 2 years ago although the measurement is a little bit better-meaning a little close to normal)  Advised patient to continue Ca2+ and Vit D. Pt verbalized understanding and thanked the office for calling.

## 2023-06-16 NOTE — Telephone Encounter (Signed)
-----   Message from Carver Fila sent at 06/16/2023  8:31 AM EST ----- Please let her know bone scan still shows osteopenia (stable from 2 years ago although the measurement is a little bit better - meaning a little close to normal). Please encourage her to continue Ca2+ and VitD. Thank you

## 2023-08-23 DIAGNOSIS — R419 Unspecified symptoms and signs involving cognitive functions and awareness: Secondary | ICD-10-CM | POA: Insufficient documentation

## 2023-09-11 ENCOUNTER — Ambulatory Visit: Admission: EM | Admit: 2023-09-11 | Discharge: 2023-09-11 | Disposition: A | Discharge status: Home / Self Care

## 2023-09-11 DIAGNOSIS — J209 Acute bronchitis, unspecified: Secondary | ICD-10-CM | POA: Diagnosis not present

## 2023-09-11 LAB — POC COVID19/FLU A&B COMBO
Covid Antigen, POC: NEGATIVE
Influenza A Antigen, POC: NEGATIVE
Influenza B Antigen, POC: NEGATIVE

## 2023-09-11 MED ORDER — PREDNISONE 10 MG PO TABS: 10.0000 mg | ORAL_TABLET | Freq: Two times a day (BID) | ORAL | 0 refills | Status: AC

## 2023-09-11 MED ORDER — AZITHROMYCIN 250 MG PO TABS: 250.0000 mg | ORAL_TABLET | Freq: Every day | ORAL | 0 refills | Status: AC

## 2023-09-11 NOTE — Discharge Instructions (Signed)
 Complete entire course of medication as prescribed.  If symptoms worsen or do not improve follow-up with primary care doctor.

## 2023-09-11 NOTE — ED Triage Notes (Signed)
 Pt presents with cough x 3 days. Pt denies any additional symptoms to report.

## 2023-09-11 NOTE — ED Provider Notes (Signed)
 [facility]    CSN: [id number] Arrival date & time: 09/11/23  1523      History   Chief Complaint Chief Complaint  Patient presents with   Cough    HPI [name] [name] is a 68 y.o. female.   Presents today with 5 days of worsening cough.  Reports a sensation that drainage is down into her throat and chest.  Has any sneezing or congestion.  She has a low-grade temp on arrival.  Reports she was exposed to her brother and mother who both been sick with a similar pattern of symptoms.  Denies any shortness of breath.  No history of asthma or outdoor allergies.  Past Medical History:  Diagnosis Date   Arthritis    Chronic pain    Contact lens/glasses fitting    wears contacts or glasses   Depression    Family history of melanoma 04/29/2021   Fibromyalgia    Melanoma (HCC)    Peritoneal carcinoma (HCC)    PONV (postoperative nausea and vomiting)    prolonged sedation very hard to wake    Patient Active Problem List   Diagnosis Date Noted   Cognitive complaints 08/23/2023   Urinary urgency 10/18/2022   Genetic testing 05/25/2021   Family history of melanoma 04/29/2021   Generalized abdominal pain 04/12/2021   Nausea 04/12/2021   Altered bowel habits 04/12/2021   Goals of care, counseling/discussion 03/12/2021   Insomnia 02/19/2021   Other constipation 02/19/2021   Internal jugular vein thrombosis, right (HCC) 02/19/2021   Extraovarian primary peritoneal carcinoma (HCC) 12/15/2020   Diarrhea 12/15/2020   Pelvic mass in female 12/01/2020   Adenopathy 11/24/2020   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, unspecified laterality (HCC) 11/16/2020   Retroperitoneal lymphadenopathy 09/30/2020   UTI symptoms 07/23/2020   Prediabetes 02/07/2019   History of lumbar surgery 12/26/2018   Lower abdominal pain 12/04/2018   Mixed hyperlipidemia 02/05/2018   Encounter for general adult medical examination without abnormal findings 02/05/2018   Screening  for cervical cancer 02/05/2018   Elevated blood pressure reading 01/26/2016   Varicose vein of leg 01/26/2016   Chronic low back pain 11/03/2015   Fibromyositis 11/03/2015   Irritable bowel syndrome 11/03/2015   Recurrent major depressive disorder, in remission (HCC) 11/03/2015   Chronic pain syndrome 06/26/2012    Past Surgical History:  Procedure Laterality Date   BACK SURGERY     lumb lam   BREAST BIOPSY Right    2021   BREAST BIOPSY Left    BREAST ENHANCEMENT SURGERY  05/30/1980   implants    BREAST IMPLANT EXCHANGE  05/30/2009   CARPAL TUNNEL RELEASE  05/30/2001   rt and lt   CARPOMETACARPEL SUSPENSION PLASTY  06/22/2012   Procedure: CARPOMETACARPEL (CMC) SUSPENSION PLASTY;  Surgeon: Harvie Junior, MD;  Location: [facility];  Service: Orthopedics;  Laterality: Left;  burton's interposition arthroplasty left thumb    CATARACT EXTRACTION W/ INTRAOCULAR LENS  IMPLANT, BILATERAL     COLONOSCOPY     x2   DIAGNOSTIC LAPAROSCOPY  05/30/2000   expl lap   DILATION AND CURETTAGE OF UTERUS  05/30/2001   ablation   ELBOW SURGERY  05/31/1999   decompression rt   EPIDURAL BLOCK INJECTION     multiple   IR IMAGING GUIDED PORT INSERTION  01/04/2021   IR REMOVAL TUN ACCESS W/ PORT W/O FL MOD SED  04/06/2021   LYMPH NODE DISSECTION N/A 12/01/2020   Procedure: LYMPH NODE DISSECTION;  Surgeon: Suzi Essex, MD;  Location: [facility];  Service: Gynecology;  Laterality: N/A;   SKIN CANCER EXCISION     melanoma, from abdomen   TONSILLECTOMY      OB History     Gravida  1   Para  1   Term      Preterm      AB      Living         SAB      IAB      Ectopic      Multiple      Live Births               Home Medications    Prior to Admission medications   Medication Sig Start Date End Date Taking? Authorizing Provider  azithromycin (ZITHROMAX) 250 MG tablet Take 1 tablet (250 mg total) by mouth daily. Take first 2 tablets together, then 1 every day  until finished. 09/11/23  Yes Buena Carmine, NP  FLUoxetine (PROZAC) 20 MG capsule Take 20 mg by mouth every morning. 06/24/21  Yes [provider]  Fluoxetine HCl, PMDD, 20 MG TABS Take by mouth. 07/28/23  Yes [provider]  predniSONE (DELTASONE) 10 MG tablet Take 1 tablet (10 mg total) by mouth 2 (two) times daily with a meal for 5 days. 09/11/23 09/16/23 Yes Buena Carmine, NP  tamoxifen (NOLVADEX) 20 MG tablet TAKE 1 TABLET BY MOUTH 2 TIMES DAILY. 05/26/23  Yes Cross, Melissa D, NP  cholecalciferol (VITAMIN D3) 25 MCG (1000 UNIT) tablet Take 1,000 Units by mouth daily.    [provider]  estradiol (ESTRACE VAGINAL) 0.1 MG/GM vaginal cream Place 1 Applicatorful vaginally 3 (three) times a week. 09/13/21   Suzi Essex, MD  Omega-3 Fatty Acids (FISH OIL BURP-LESS) 1000 MG CAPS Take by mouth.    [provider]  traZODone (DESYREL) 25 mg TABS tablet Take 25 mg by mouth at bedtime.    [provider]  Vibegron (GEMTESA) 75 MG TABS Take 1 tablet by mouth daily.    [provider]  prochlorperazine (COMPAZINE) 10 MG tablet Take 1 tablet (10 mg total) by mouth every 6 (six) hours as needed (Nausea or vomiting). 12/15/20 03/12/21  Almeda Jacobs, MD    Family History Family History  Problem Relation Age of Onset   Melanoma Mother        behind ear; on leg; dx after 50   Basal cell carcinoma Mother        face   Lung cancer Father 27       smoking hx   Leukemia Maternal Grandmother        dx after 46   Lung cancer Cousin        maternal female cousin; dx after 38; smoking hx   Cancer Cousin        paternal female cousin; unknown type; dx after 23   Colon cancer Neg Hx    Breast cancer Neg Hx    Ovarian cancer Neg Hx    Endometrial cancer Neg Hx    Pancreatic cancer Neg Hx    Prostate cancer Neg Hx     Social History Social History   Tobacco Use   Smoking status: Never   Smokeless tobacco: Never  Vaping Use   Vaping  status: Never Used  Substance Use Topics   Alcohol use: Yes    Comment: rare   Drug use: No     Allergies  Ciprofloxacin-ciproflox hcl er, Amoxicillin, Bupropion, Ciprofloxacin, Codeine, Duloxetine, Methylphenidate, Other, and Latex   Review of Systems Review of Systems  Respiratory:  Positive for cough.      Physical Exam Triage Vital Signs ED Triage Vitals  Encounter Vitals Group     BP 09/11/23 1554 119/77     Systolic BP Percentile --      Diastolic BP Percentile --      Pulse Rate 09/11/23 1554 78     Resp 09/11/23 1554 18     Temp 09/11/23 1554 99.1 F (37.3 C)     Temp Source 09/11/23 1554 Oral     SpO2 09/11/23 1554 97 %     Weight --      Height --      Head Circumference --      Peak Flow --      Pain Score 09/11/23 1552 5     Pain Loc --      Pain Education --      Exclude from Growth Chart --    No data found.  Updated Vital Signs BP 119/77 (BP Location: Left Arm)   Pulse 78   Temp 99.1 F (37.3 C) (Oral)   Resp 18   SpO2 97%   Visual Acuity Right Eye Distance:   Left Eye Distance:   Bilateral Distance:    Right Eye Near:   Left Eye Near:    Bilateral Near:     Physical Exam Vitals reviewed.  HENT:     Head: Normocephalic and atraumatic.     Nose: No congestion or rhinorrhea.     Mouth/Throat:     Pharynx: Postnasal drip present.  Eyes:     Extraocular Movements: Extraocular movements intact.     Pupils: Pupils are equal, round, and reactive to light.  Cardiovascular:     Rate and Rhythm: Normal rate and regular rhythm.  Pulmonary:     Breath sounds: Rhonchi present. No wheezing.  Musculoskeletal:     Cervical back: Normal range of motion and neck supple.  Neurological:     General: No focal deficit present.     Mental Status: She is alert.      UC Treatments / Results  Labs (all labs ordered are listed, but only abnormal results are displayed) Labs Reviewed  POC COVID19/FLU A&B COMBO - Normal     EKG   Radiology No results found.  Procedures Procedures (including critical care time)  Medications Ordered in UC Medications - No data to display  Initial Impression / Assessment and Plan / UC Course  I have reviewed the triage vital signs and the nursing notes.  Pertinent labs & imaging results that were available during my care of the patient were reviewed by me and considered in my medical decision making (see chart for details).    Acute bronchitis x 5 days with worsening of symptoms, treatment with prednisone 10 mg twice daily for 5 days and azithromycin prophylactically to prevent against pneumonia.  Precautions given if symptoms worsen or do not improve. Final Clinical Impressions(s) / UC Diagnoses   Final diagnoses:  Acute bronchitis, unspecified organism     Discharge Instructions      Complete entire course of medication as prescribed.  If symptoms worsen or do not improve follow-up with primary care doctor.     ED Prescriptions     Medication Sig Dispense Auth. Provider   azithromycin (ZITHROMAX) 250 MG tablet Take 1 tablet (250 mg total) by mouth daily.  Take first 2 tablets together, then 1 every day until finished. 6 tablet Buena Carmine, NP   predniSONE (DELTASONE) 10 MG tablet Take 1 tablet (10 mg total) by mouth 2 (two) times daily with a meal for 5 days. 10 tablet Buena Carmine, NP      PDMP not reviewed this encounter.   Buena Carmine, NP 09/11/23 [phone]

## 2023-09-21 ENCOUNTER — Other Ambulatory Visit: Payer: Self-pay | Admitting: Gynecologic Oncology

## 2023-09-21 DIAGNOSIS — C481 Malignant neoplasm of specified parts of peritoneum: Secondary | ICD-10-CM

## 2023-09-22 ENCOUNTER — Inpatient Hospital Stay: Payer: Medicare Other | Attending: Gynecologic Oncology | Admitting: Gynecologic Oncology

## 2023-09-22 ENCOUNTER — Inpatient Hospital Stay: Payer: Medicare Other

## 2023-09-22 ENCOUNTER — Encounter: Payer: Self-pay | Admitting: Gynecologic Oncology

## 2023-09-22 VITALS — BP 133/74 | HR 61 | Temp 98.4°F | Ht 61.77 in | Wt 149.2 lb

## 2023-09-22 DIAGNOSIS — Z79899 Other long term (current) drug therapy: Secondary | ICD-10-CM | POA: Diagnosis not present

## 2023-09-22 DIAGNOSIS — M8580 Other specified disorders of bone density and structure, unspecified site: Secondary | ICD-10-CM | POA: Insufficient documentation

## 2023-09-22 DIAGNOSIS — C482 Malignant neoplasm of peritoneum, unspecified: Secondary | ICD-10-CM | POA: Diagnosis present

## 2023-09-22 DIAGNOSIS — Z9221 Personal history of antineoplastic chemotherapy: Secondary | ICD-10-CM | POA: Insufficient documentation

## 2023-09-22 DIAGNOSIS — C481 Malignant neoplasm of specified parts of peritoneum: Secondary | ICD-10-CM

## 2023-09-22 DIAGNOSIS — Z79810 Long term (current) use of selective estrogen receptor modulators (SERMs): Secondary | ICD-10-CM | POA: Diagnosis not present

## 2023-09-22 NOTE — Patient Instructions (Signed)
 It was good to see you today.  I do not see or feel any evidence of cancer recurrence on your exam.  I will see you for follow-up in 4 months.  As always, if you develop any new and concerning symptoms before your next visit, please call to see me sooner.

## 2023-09-22 NOTE — Progress Notes (Signed)
 Gynecologic Oncology Return Clinic Visit  09/22/23  Reason for Visit: surveillance visit in the setting of low-grade serous carcinoma   Treatment History: Oncology History Overview Note  Low grade serous, ER positive Foundation One done on 8/9: HRD not detected, MSI stable, low tumor mutational burden 1 Muts/Mb, no alterations of BRCA1 & 2   Extraovarian primary peritoneal carcinoma (HCC)  09/29/2020 Imaging   MRI lumbar spine The dominant finding in this case is that of massive retroperitoneal lymphadenopathy, likely related to either metastatic disease or lymphoma.   Chronic degenerative and postoperative findings of the spine as outlined in detail above   10/05/2020 Imaging   IMPRESSION outside CT 1. Large calcified retroperitoneal lymph nodes highly concerning for metastatic disease, likely related to primary carcinoma of the colon or lymphoma. Clinical correlation is recommended. 2. No bowel obstruction. Normal appendix. 3. Stable right hepatic dome hemangioma.   10/30/2020 PET scan   1. The densely calcified retroperitoneal adenopathy is highly hypermetabolic with maximum SUV of 23.4 (Deauville 5) there is also Deauville 3 activity in a small right axillary lymph node. Given the lack of an obvious primary outside of the retroperitoneum, appearance tends to favor lymphoma or Castleman's disease. Tissue diagnosis suggested. 2. Other imaging findings of potential clinical significance: Chronic left maxillary sinusitis. Aortic Atherosclerosis (ICD10-I70.0). Coronary atherosclerosis.   11/10/2020 Procedure   CT guided biopsy of retroperitoneal adenopathy   11/10/2020 Pathology Results   biopsy results which favor a low-grade serous carcinoma of gyn origin. This is a somewhat atypical presentation, but my suspicion is that it is primary peritoneal carcinoma   11/16/2020 Tumor Marker   Patient's tumor was tested for the following markers: CA-125. Results of the tumor marker test revealed  96.4.   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 UTERUS, CERVIX, BILATERAL FALLOPIAN TUBES AND OVARIES:  Uterus:  -  Inactive endometrium  -  Leiomyoma (1.1 cm; largest)  -  Calcified nodule of the broad ligament with psammoma bodies (right)  -  No hyperplasia or malignancy identified   Cervix:  -  Benign cervix  -  No dysplasia or malignancy identified   Ovaries, bilateral:  -  Serous cyst adenofibroma with microscopic focus of borderline change  (right)    Fallopian tubes, bilateral:  -  Benign fallopian tube with paratubal cyst (right)  -  No malignancy identified   B. OMENTUM, OMENTECTOMY:  -  No carcinoma identified   C. PERIAORTIC TUMOR, EXCISION:  -  Serous carcinoma, low-grade, with psammoma bodies  -  See comment below   COMMENT:   C.  At the request of the treating clinician, immunohistochemistry was repeated.  The neoplastic cells are positive for ER, cytokeratin 7, cytokeratin 5/6, p53, PAX8 and WT1 but negative for cytokeratin 20,  TTF-1, PR and thyroglobulin.  The morphology and immunophenotype are consistent with a primary peritoneal low-grade serous carcinoma (see also, [id number]).   ONCOLOGY TABLE:   OVARY or FALLOPIAN TUBE or PRIMARY PERITONEUM: Resection   Procedure: Total hysterectomy and bilateral salpingo-oophorectomy with omentectomy and periaortic tumor excision  Specimen Integrity: Intact  Tumor Site: Primary peritoneum (see comment)  Tumor Size: See comment  Histologic Type: Low-grade serous carcinoma  Histologic Grade: Low-grade  Ovarian Surface Involvement: Not identified  Fallopian Tube Surface Involvement: Not identified  Implants (  required for advanced stage serous/seromucinous borderline  tumors only): Not applicable  Other Tissue/  Organ Involvement: Periaortic tumor  Largest Extrapelvic Peritoneal Focus: 5.5 cm (aggregate measurement)  Peritoneal/Ascitic Fluid Involvement: Not applicable  Chemotherapy Response Score (CRS): Not applicable, no known presurgical therapy  Regional Lymph Nodes: Not applicable (no lymph nodes submitted or found)  Distant Metastasis:       Distant Site(s) Involved: Not applicable  Pathologic Stage Classification (pTNM, AJCC 8th Edition): pT3, pN not assigned  Ancillary Studies: Can be performed upon request  Representative Tumor Block: C1  Comment(s): The only focus of low-grade serous carcinoma identified in the resection specimen is the tissue submitted as periaortic tumor (part C).  Clinically, this was favored to represent matted lymph nodes; however, there is no evidence of nodal tissue in the examined material. This case was discussed with Dr. Orvil Bland and given the extent of the matted lymph nodes, this is being staged as a pT3.     12/01/2020 Surgery   Pre-operative Diagnosis: low grade serous carcinoma with significant retroperitoneal adenopathy, no other obvious imaging evidence of disease   Post-operative Diagnosis: same, significantly adherent presacral and para-aortic lymphadenopathy   Operation: Robotic-assisted laparoscopic total hysterectomy with bilateral salpingo-oophorectomy, laparotomy, infra-colic omentectomy, portion of necrotic tumor-ridden retroperitoneal lymph nodes, oversew of sigmoid serosa   Surgeon: Wiley Hanger MD   Assistant Surgeon: Abdul Hodgkin MD (an MD assistant was necessary for tissue manipulation, management of robotic instrumentation, retraction and positioning due to the complexity of the case and hospital policies).    Intra-operative consultant:  Alphonso Aschoff MD    Operative Findings: On EUA, small mobile uterus. ON intra-abdominal entry, normal liver and diaphragm. One filmy adhesion noted between the right liver lobe and the anterior  abdominal wall. The spleen was somewhat lateral and appeared mildly enlarged. There were some adhesions of the infragastric omentum to the anterior abdominal wall in the midline. The omentum was normal appearing without obvious evidence of tumor. Small and large bowel were normal appearing, appendix normal. Uterus 6cm and normal in appearance. Somewhat atrophic bilateral adnexa. There were filmy adhesions between each adnexa and the pelvic sidewall. Some filmy adhesions were also noted between the posterior uterus and rectum. Approximately 0.5-1cm white nodule within the broad ligament, lateral to the right cornua, c/w possible fibroma vs fibroid. No pelvic adenopathy. Significantly enlarged, necrotic, and tumor-filled lymph node conglomeration was noted starting within the presacral space (adherent to the sacral promontory) and extending superiorly along the great vessels to the level of the renal vessel (suspected). Given adherence of the involved lymph nodes, I could not visualize this anatomy, I could long palpate along each lateral aspect of the aorta. There was no identifiable plane between matted lymph nodes and underlying aorta or IVC. Given burden of disease and my concern that resection was not feasible, I asked my senior partner (Dr. Pearly Bound) to evaluate intra-op. She agreed that moving forward with attempt at debulking involved lymph nodes was likely not feasible and an attempt at this time would be unsafe.   There was no obvious peritoneal disease and no clear involvement of gyn organs. Despite prior biopsy, findings at surgery remain concerning for a non-gynecologic process. If this is confirmed to be low-grade serous carcinoma of gyn origin, this was an R2 resection.    12/15/2020 Initial Diagnosis   Extraovarian primary peritoneal carcinoma (HCC)   12/15/2020 Cancer Staging   Staging form: Ovary, AJCC 7th Edition - Pathologic stage from 12/15/2020: Stage IIIC (T3c,  N0, cM0) - Signed by Almeda Jacobs, MD on 12/15/2020 Staged by: Managing physician Stage prefix: Initial diagnosis   01/04/2021 Procedure   Successful placement of a power injectable Port-A-Cath via the right internal jugular vein. The catheter is ready for immediate use.   01/08/2021 - 02/19/2021 Chemotherapy    Patient is on Treatment Plan: OVARIAN CARBOPLATIN  (AUC 6) / PACLITAXEL  (175) Q21D X 6 CYCLES       01/29/2021 Tumor Marker   Patient's tumor was tested for the following markers: CA-125. Results of the tumor marker test revealed 59.   02/05/2021 Imaging   Right:  No evidence of superficial vein thrombosis in the upper extremity.  Findings  consistent with acute deep vein thrombosis involving the right internal jugular vein.     Left:  No evidence of thrombosis in the subclavian.       03/10/2021 Tumor Marker   Patient's tumor was tested for the following markers: CA-125. Results of the tumor marker test revealed 81.8.   03/11/2021 Imaging   Stable bulky calcified retroperitoneal lymphadenopathy, consistent with metastatic disease.   Stable right hepatic lobe lesion, likely representing a benign hemangioma. This shoeds no FDG uptake on previous PET-CT scan.   No evidence of new or progressive disease within the abdomen or pelvis.   Aortic Atherosclerosis (ICD10-I70.0).   04/06/2021 Procedure   Successful removal of implanted Port-A-Cath.   04/29/2021 Tumor Marker   Patient's tumor was tested for the following markers: CA-125. Results of the tumor marker test revealed 29.4.   05/22/2021 Genetic Testing   Negative hereditary cancer genetic testing: no pathogenic variants detected in [facility] Multi-Cancer +RNA Panel.  The report date is 05/22/2021.   The Multi-Cancer + RNA Panel offered by [facility] includes sequencing and/or deletion/duplication analysis of the following 84 genes:  AIP*, ALK, APC*, ATM*, AXIN2*, BAP1*, BARD1*, BLM*, BMPR1A*, BRCA1*, BRCA2*, BRIP1*, CASR, CDC73*, CDH1*, CDK4, CDKN1B*,  CDKN1C*, CDKN2A, CEBPA, CHEK2*, CTNNA1*, DICER1*, DIS3L2*, EGFR, EPCAM, FH*, FLCN*, GATA2*, GPC3, GREM1, HOXB13, HRAS, KIT, MAX*, MEN1*, MET, MITF, MLH1*, MSH2*, MSH3*, MSH6*, MUTYH*, NBN*, NF1*, NF2*, NTHL1*, PALB2*, PDGFRA, PHOX2B, PMS2*, POLD1*, POLE*, POT1*, PRKAR1A*, PTCH1*, PTEN*, RAD50*, RAD51C*, RAD51D*, RB1*, RECQL4, RET, RUNX1*, SDHA*, SDHAF2*, SDHB*, SDHC*, SDHD*, SMAD4*, SMARCA4*, SMARCB1*, SMARCE1*, STK11*, SUFU*, TERC, TERT, TMEM127*, Tp53*, TSC1*, TSC2*, VHL*, WRN*, and WT1.  RNA analysis is performed for * genes.   07/08/2021 Tumor Marker   Patient's tumor was tested for the following markers: CA-125. Results of the tumor marker test revealed 16.   07/12/2021 Imaging   CT A/P 1. Mild decrease in size of isolated calcified abdominal retroperitoneal nodal metastasis. 2. No new or progressive disease. 3. Similar high right hepatic lobe lesion, favoring a hemangioma. 4.  Aortic Atherosclerosis (ICD10-I70.0).   09/13/2021 Tumor Marker   Patient's tumor was tested for the following markers: CA-125. Results of the tumor marker test revealed 56.1.   09/27/2021 Imaging   CT A/P 1. Slight decreased size calcified retroperitoneal lymph nodes. No new or progressive disease. 2. Stable lesion of the right hepatic lobe, likely a hemangioma. 3. Aortic Atherosclerosis (ICD10-I70.0).   12/28/2021 Imaging   CT A/P 1. Calcified abdominal adenopathy is minimally decreased in size since 09/28/2021, most consistent with response to therapy. 2. No new or progressive disease. 3. Similar borderline to mild common duct dilatation, without cause identified. This could be correlated with bilirubin level. 4.  Aortic Atherosclerosis (ICD10-I70.0).   12/31/2021 Tumor Marker   Patient's tumor was tested for the following markers: CA-125. Results  of the tumor marker test revealed 19.   04/01/2022 Tumor Marker   Patient's tumor was tested for the following markers: CA-125. Results of the tumor marker test  revealed 14.2.     Interval History: Overall doing well.  [facility] and in the setting of her mother's dementia.  She had an MRI recently and goes back next week for these results.  She continues to endorse some fatigue.  Struggles now intermittently with constipation.  Seferino Dade has helped some with her urinary symptoms although she does not take it every day because of constipation that it causes.  Denies any vaginal bleeding or discharge.    She and her husband are traveling to [place] next month and also moving into a new house.  Past Medical/Surgical History: Past Medical History:  Diagnosis Date   Arthritis    Chronic pain    Contact lens/glasses fitting    wears contacts or glasses   Depression    Family history of melanoma 04/29/2021   Fibromyalgia    Melanoma (HCC)    Peritoneal carcinoma (HCC)    PONV (postoperative nausea and vomiting)    prolonged sedation very hard to wake    Past Surgical History:  Procedure Laterality Date   BACK SURGERY     lumb lam   BREAST BIOPSY Right    2021   BREAST BIOPSY Left    BREAST ENHANCEMENT SURGERY  05/30/1980   implants    BREAST IMPLANT EXCHANGE  05/30/2009   CARPAL TUNNEL RELEASE  05/30/2001   rt and lt   CARPOMETACARPEL SUSPENSION PLASTY  06/22/2012   Procedure: CARPOMETACARPEL (CMC) SUSPENSION PLASTY;  Surgeon: Boston Byers, MD;  Location: [facility];  Service: Orthopedics;  Laterality: Left;  burton's interposition arthroplasty left thumb    CATARACT EXTRACTION W/ INTRAOCULAR LENS  IMPLANT, BILATERAL     COLONOSCOPY     x2   DIAGNOSTIC LAPAROSCOPY  05/30/2000   expl lap   DILATION AND CURETTAGE OF UTERUS  05/30/2001   ablation   ELBOW SURGERY  05/31/1999   decompression rt   EPIDURAL BLOCK INJECTION     multiple   IR IMAGING GUIDED PORT INSERTION  01/04/2021   IR REMOVAL TUN ACCESS W/ PORT W/O FL MOD SED  04/06/2021   LYMPH NODE DISSECTION N/A 12/01/2020    Procedure: LYMPH NODE DISSECTION;  Surgeon: Suzi Essex, MD;  Location: [facility];  Service: Gynecology;  Laterality: N/A;   SKIN CANCER EXCISION     melanoma, from abdomen   TONSILLECTOMY      Family History  Problem Relation Age of Onset   Melanoma Mother        behind ear; on leg; dx after 50   Basal cell carcinoma Mother        face   Lung cancer Father 74       smoking hx   Leukemia Maternal Grandmother        dx after 72   Lung cancer Cousin        maternal female cousin; dx after 17; smoking hx   Cancer Cousin        paternal female cousin; unknown type; dx after 23   Colon cancer Neg Hx    Breast cancer Neg Hx    Ovarian cancer Neg Hx    Endometrial cancer Neg Hx    Pancreatic cancer Neg Hx    Prostate cancer Neg Hx  Social History   Socioeconomic History   Marital status: Married    Spouse name: Not on file   Number of children: Not on file   Years of education: Not on file   Highest education level: Not on file  Occupational History   Not on file  Tobacco Use   Smoking status: Never   Smokeless tobacco: Never  Vaping Use   Vaping status: Never Used  Substance and Sexual Activity   Alcohol use: Yes    Comment: rare   Drug use: No   Sexual activity: Yes    Partners: Male    Birth control/protection: Post-menopausal  Other Topics Concern   Not on file  Social History Narrative   Not on file   Social Drivers of Health   Financial Resource Strain: Not on file  Food Insecurity: Low Risk  (07/28/2023)   Received from [facility]   Hunger Vital Sign    Worried About Running Out of Food in the Last Year: Never true    Ran Out of Food in the Last Year: Never true  Transportation Needs: No Transportation Needs (07/28/2023)   Received from [facility]    In the past 12 months, has lack of reliable transportation kept you from medical appointments, meetings, work or from getting things needed for daily living? : No  Physical  Activity: Not on file  Stress: Not on file  Social Connections: Not on file    Current Medications:  Current Outpatient Medications:    azithromycin  (ZITHROMAX ) 250 MG tablet, Take 1 tablet (250 mg total) by mouth daily. Take first 2 tablets together, then 1 every day until finished., Disp: 6 tablet, Rfl: 0   cholecalciferol (VITAMIN D3) 25 MCG (1000 UNIT) tablet, Take 1,000 Units by mouth daily., Disp: , Rfl:    estradiol  (ESTRACE  VAGINAL) 0.1 MG/GM vaginal cream, Place 1 Applicatorful vaginally 3 (three) times a week., Disp: 42.5 g, Rfl: 12   FLUoxetine  (PROZAC ) 20 MG capsule, Take 20 mg by mouth every morning., Disp: , Rfl:    Fluoxetine  HCl, PMDD, 20 MG TABS, Take by mouth., Disp: , Rfl:    Omega-3 Fatty Acids (FISH OIL BURP-LESS) 1000 MG CAPS, Take by mouth., Disp: , Rfl:    tamoxifen  (NOLVADEX ) 20 MG tablet, TAKE 1 TABLET BY MOUTH 2 TIMES DAILY., Disp: 180 tablet, Rfl: 3   traZODone  (DESYREL ) 25 mg TABS tablet, Take 25 mg by mouth at bedtime., Disp: , Rfl:    Vibegron (GEMTESA) 75 MG TABS, Take 1 tablet by mouth daily., Disp: , Rfl:   Review of Systems: + Cough, constipation, frequency, joint pain, back pain, decreased concentration Denies appetite changes, fevers, chills, fatigue, unexplained weight changes. Denies hearing loss, neck lumps or masses, mouth sores, ringing in ears or voice changes. Denies wheezing.  Denies shortness of breath. Denies chest pain or palpitations. Denies leg swelling. Denies abdominal distention, pain, blood in stools, diarrhea, nausea, vomiting, or early satiety. Denies pain with intercourse, dysuria, hematuria or incontinence. Denies hot flashes, pelvic pain, vaginal bleeding or vaginal discharge.   Denies muscle pain/cramps. Denies itching, rash, or wounds. Denies dizziness, headaches, numbness or seizures. Denies swollen lymph nodes or glands, denies easy bruising or bleeding. Denies anxiety, depression, confusion.  Physical Exam: BP 133/74  (BP Location: Left Arm, Patient Position: Sitting)   Pulse 61   Temp 98.4 F (36.9 C) (Oral)   Ht 5' 1.77" (1.569 m)   Wt 149 lb 3.2 oz (67.7 kg)  SpO2 100%   BMI 27.49 kg/m  General: Alert, oriented, no acute distress. HEENT: Normocephalic, atraumatic, sclera anicteric. Chest: Clear to auscultation bilaterally.  No wheezes or rhonchi. Cardiovascular: Regular rate and rhythm, no murmurs. Abdomen: soft, nontender.  Normoactive bowel sounds.  No masses or hepatosplenomegaly appreciated.  Well-healed incisions. Extremities: Grossly normal range of motion.  Warm, well perfused.  No edema bilaterally. Skin: No rashes or lesions noted. Lymphatics: No cervical, supraclavicular, or inguinal adenopathy. GU: Normal appearing external genitalia without erythema, excoriation, or lesions.  Speculum exam reveals mildly atrophic vaginal mucosa, no lesions or masses noted.  Cuff intact.  Bimanual exam reveals no masses or nodularity.    Laboratory & Radiologic Studies:          Component Ref Range & Units (hover) 3 mo ago (05/26/23) 7 mo ago (02/09/23) 10 mo ago (11/10/22) 1 yr ago (07/15/22) 1 yr ago (04/01/22) 1 yr ago (12/31/21) 2 yr ago (09/13/21)  Cancer Antigen (CA) 125 8.4 7.9 CM 7.9 CM 11.7 CM 14.2 CM 19.0 CM 56.1 High  CM      Assessment & Plan: [name] [name] is a 68 y.o. woman with Stage IIIC presumed primary peritoneal carcinoma, low-grade. Completed adjuvant chemotherapy in 01/2021.  Currently on Tamoxifen . Last CT in 01/2023 showed stable findings (5.1 x 3.6 x 1.7 cm calcified conglomerate aortocaval nodal mass), no evidence of progression. Bone density 04/2023: osteopenia   Patient is doing well.  NED on exam today.  She is tolerating tamoxifen  very well.    Significant social stressors at home.  Currently being worked up for brain fog, awaiting MRI results.   CA125 was drawn today.     We will plan for surveillance visit in 4 months with blood work at that time.  Plan for  next imaging in May. We reviewed signs and symptoms that should prompt a phone call between visits were reviewed.  20 minutes of total time was spent for this patient encounter, including preparation, face-to-face counseling with the patient and coordination of care, and documentation of the encounter.  Wiley Hanger, MD  Division of Gynecologic Oncology  Department of Obstetrics and Gynecology  [facility]

## 2023-09-23 LAB — CA 125: Cancer Antigen (CA) 125: 11.1 U/mL (ref 0.0–38.1)

## 2023-09-25 ENCOUNTER — Encounter: Payer: Self-pay | Admitting: Gynecologic Oncology

## 2023-10-03 ENCOUNTER — Telehealth: Payer: Self-pay

## 2023-10-03 NOTE — Telephone Encounter (Signed)
 Per Dr.Tucker, pt is aware of recent CA125 being 11.1 which is normal.

## 2023-10-06 ENCOUNTER — Telehealth: Payer: Self-pay | Admitting: *Deleted

## 2023-10-06 ENCOUNTER — Ambulatory Visit (HOSPITAL_COMMUNITY)
Admission: RE | Admit: 2023-10-06 | Discharge: 2023-10-06 | Disposition: A | Discharge status: Home / Self Care | Source: Ambulatory Visit | Attending: Gynecologic Oncology | Admitting: Gynecologic Oncology

## 2023-10-06 DIAGNOSIS — C481 Malignant neoplasm of specified parts of peritoneum: Secondary | ICD-10-CM | POA: Diagnosis present

## 2023-10-06 MED ORDER — IOHEXOL 9 MG/ML PO SOLN
ORAL | Status: AC
  Filled 2023-10-06: qty 1000

## 2023-10-06 MED ORDER — IOHEXOL 9 MG/ML PO SOLN
500.0000 mL | ORAL | Status: AC
  Administered 2023-10-06 (×2): 500 mL via ORAL

## 2023-10-06 MED ORDER — IOHEXOL 300 MG/ML  SOLN
100.0000 mL | Freq: Once | INTRAMUSCULAR | Status: AC | PRN
  Administered 2023-10-06: 100 mL via INTRAVENOUS

## 2023-10-06 MED ORDER — SODIUM CHLORIDE (PF) 0.9 % IJ SOLN
INTRAMUSCULAR | Status: AC
  Filled 2023-10-06: qty 50

## 2023-10-06 NOTE — Telephone Encounter (Signed)
-----   Message from Suzi Essex sent at 10/06/2023  2:18 PM EDT ----- Please let the patient know CT looks stable - no new areas concerning for cancer. Thank you!

## 2023-10-06 NOTE — Telephone Encounter (Signed)
 Spoke with [name] and relayed message from Dr. Orvil Bland that her CT scan looks stable- no new areas concerning for cancer. Pt states, "wonderful news" thank you for calling. Pt reminded of her follow up appt. With Dr. Orvil Bland on 02/02/2024 at 2 pm.

## 2024-02-01 ENCOUNTER — Other Ambulatory Visit: Payer: Self-pay | Admitting: Gynecologic Oncology

## 2024-02-01 DIAGNOSIS — C481 Malignant neoplasm of specified parts of peritoneum: Secondary | ICD-10-CM

## 2024-02-02 ENCOUNTER — Inpatient Hospital Stay: Attending: Gynecologic Oncology | Admitting: Gynecologic Oncology

## 2024-02-02 ENCOUNTER — Encounter: Payer: Self-pay | Admitting: Gynecologic Oncology

## 2024-02-02 ENCOUNTER — Inpatient Hospital Stay

## 2024-02-02 VITALS — BP 135/68 | HR 63 | Temp 98.8°F | Resp 19 | Wt 148.4 lb

## 2024-02-02 DIAGNOSIS — M8580 Other specified disorders of bone density and structure, unspecified site: Secondary | ICD-10-CM | POA: Insufficient documentation

## 2024-02-02 DIAGNOSIS — Z90710 Acquired absence of both cervix and uterus: Secondary | ICD-10-CM | POA: Insufficient documentation

## 2024-02-02 DIAGNOSIS — Z90722 Acquired absence of ovaries, bilateral: Secondary | ICD-10-CM | POA: Insufficient documentation

## 2024-02-02 DIAGNOSIS — Z9079 Acquired absence of other genital organ(s): Secondary | ICD-10-CM | POA: Diagnosis not present

## 2024-02-02 DIAGNOSIS — Z9221 Personal history of antineoplastic chemotherapy: Secondary | ICD-10-CM | POA: Diagnosis not present

## 2024-02-02 DIAGNOSIS — Z79810 Long term (current) use of selective estrogen receptor modulators (SERMs): Secondary | ICD-10-CM | POA: Insufficient documentation

## 2024-02-02 DIAGNOSIS — Z170 Estrogen receptor positive status [ER+]: Secondary | ICD-10-CM | POA: Diagnosis not present

## 2024-02-02 DIAGNOSIS — C482 Malignant neoplasm of peritoneum, unspecified: Secondary | ICD-10-CM | POA: Insufficient documentation

## 2024-02-02 DIAGNOSIS — Z79899 Other long term (current) drug therapy: Secondary | ICD-10-CM | POA: Insufficient documentation

## 2024-02-02 DIAGNOSIS — C481 Malignant neoplasm of specified parts of peritoneum: Secondary | ICD-10-CM

## 2024-02-02 NOTE — Progress Notes (Signed)
 Gynecologic Oncology Return Clinic Visit  02/02/24  Reason for Visit: surveillance visit in the setting of low-grade serous carcinoma   Treatment History: Oncology History Overview Note  Low grade serous, ER positive [facility] One done on 8/9: HRD not detected, MSI stable, low tumor mutational burden 1 Muts/Mb, no alterations of BRCA1 & 2   Extraovarian primary peritoneal carcinoma (HCC)  09/29/2020 Imaging   MRI lumbar spine The dominant finding in this case is that of massive retroperitoneal lymphadenopathy, likely related to either metastatic disease or lymphoma.   Chronic degenerative and postoperative findings of the spine as outlined in detail above   10/05/2020 Imaging   IMPRESSION outside CT 1. Large calcified retroperitoneal lymph nodes highly concerning for metastatic disease, likely related to primary carcinoma of the colon or lymphoma. Clinical correlation is recommended. 2. No bowel obstruction. Normal appendix. 3. Stable right hepatic dome hemangioma.   10/30/2020 PET scan   1. The densely calcified retroperitoneal adenopathy is highly hypermetabolic with maximum SUV of 23.4 (Deauville 5) there is also Deauville 3 activity in a small right axillary lymph node. Given the lack of an obvious primary outside of the retroperitoneum, appearance tends to favor lymphoma or Castleman's disease. Tissue diagnosis suggested. 2. Other imaging findings of potential clinical significance: Chronic left maxillary sinusitis. Aortic Atherosclerosis (ICD10-I70.0). Coronary atherosclerosis.   11/10/2020 Procedure   CT guided biopsy of retroperitoneal adenopathy   11/10/2020 Pathology Results   biopsy results which favor a low-grade serous carcinoma of gyn origin. This is a somewhat atypical presentation, but my suspicion is that it is primary peritoneal carcinoma   11/16/2020 Tumor Marker   Patient's tumor was tested for the following markers: CA-125. Results of the tumor marker test revealed  96.4.   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 UTERUS, CERVIX, BILATERAL FALLOPIAN TUBES AND OVARIES:  Uterus:  -  Inactive endometrium  -  Leiomyoma (1.1 cm; largest)  -  Calcified nodule of the broad ligament with psammoma bodies (right)  -  No hyperplasia or malignancy identified   Cervix:  -  Benign cervix  -  No dysplasia or malignancy identified   Ovaries, bilateral:  -  Serous cyst adenofibroma with microscopic focus of borderline change  (right)    Fallopian tubes, bilateral:  -  Benign fallopian tube with paratubal cyst (right)  -  No malignancy identified   B. OMENTUM, OMENTECTOMY:  -  No carcinoma identified   C. PERIAORTIC TUMOR, EXCISION:  -  Serous carcinoma, low-grade, with psammoma bodies  -  See comment below   COMMENT:   C.  At the request of the treating clinician, immunohistochemistry was repeated.  The neoplastic cells are positive for ER, cytokeratin 7, cytokeratin 5/6, p53, PAX8 and WT1 but negative for cytokeratin 20,  TTF-1, PR and thyroglobulin.  The morphology and immunophenotype are consistent with a primary peritoneal low-grade serous carcinoma (see also, [id number]).   ONCOLOGY TABLE:   OVARY or FALLOPIAN TUBE or PRIMARY PERITONEUM: Resection   Procedure: Total hysterectomy and bilateral salpingo-oophorectomy with omentectomy and periaortic tumor excision  Specimen Integrity: Intact  Tumor Site: Primary peritoneum (see comment)  Tumor Size: See comment  Histologic Type: Low-grade serous carcinoma  Histologic Grade: Low-grade  Ovarian Surface Involvement: Not identified  Fallopian Tube Surface Involvement: Not identified  Implants (  required for advanced stage serous/seromucinous borderline  tumors only): Not applicable  Other Tissue/  Organ Involvement: Periaortic tumor  Largest Extrapelvic Peritoneal Focus: 5.5 cm (aggregate measurement)  Peritoneal/Ascitic Fluid Involvement: Not applicable  Chemotherapy Response Score (CRS): Not applicable, no known presurgical therapy  Regional Lymph Nodes: Not applicable (no lymph nodes submitted or found)  Distant Metastasis:       Distant Site(s) Involved: Not applicable  Pathologic Stage Classification (pTNM, AJCC 8th Edition): pT3, pN not assigned  Ancillary Studies: Can be performed upon request  Representative Tumor Block: C1  Comment(s): The only focus of low-grade serous carcinoma identified in the resection specimen is the tissue submitted as periaortic tumor (part C).  Clinically, this was favored to represent matted lymph nodes; however, there is no evidence of nodal tissue in the examined material. This case was discussed with Dr. Viktoria and given the extent of the matted lymph nodes, this is being staged as a pT3.     12/01/2020 Surgery   Pre-operative Diagnosis: low grade serous carcinoma with significant retroperitoneal adenopathy, no other obvious imaging evidence of disease   Post-operative Diagnosis: same, significantly adherent presacral and para-aortic lymphadenopathy   Operation: Robotic-assisted laparoscopic total hysterectomy with bilateral salpingo-oophorectomy, laparotomy, infra-colic omentectomy, portion of necrotic tumor-ridden retroperitoneal lymph nodes, oversew of sigmoid serosa   Surgeon: Viktoria Crank MD   Assistant Surgeon: Olam Leonce Ada MD (an MD assistant was necessary for tissue manipulation, management of robotic instrumentation, retraction and positioning due to the complexity of the case and hospital policies).    Intra-operative consultant:  Eloy Herring MD    Operative Findings: On EUA, small mobile uterus. ON intra-abdominal entry, normal liver and diaphragm. One filmy adhesion noted between the right liver lobe and the anterior  abdominal wall. The spleen was somewhat lateral and appeared mildly enlarged. There were some adhesions of the infragastric omentum to the anterior abdominal wall in the midline. The omentum was normal appearing without obvious evidence of tumor. Small and large bowel were normal appearing, appendix normal. Uterus 6cm and normal in appearance. Somewhat atrophic bilateral adnexa. There were filmy adhesions between each adnexa and the pelvic sidewall. Some filmy adhesions were also noted between the posterior uterus and rectum. Approximately 0.5-1cm white nodule within the broad ligament, lateral to the right cornua, c/w possible fibroma vs fibroid. No pelvic adenopathy. Significantly enlarged, necrotic, and tumor-filled lymph node conglomeration was noted starting within the presacral space (adherent to the sacral promontory) and extending superiorly along the great vessels to the level of the renal vessel (suspected). Given adherence of the involved lymph nodes, I could not visualize this anatomy, I could long palpate along each lateral aspect of the aorta. There was no identifiable plane between matted lymph nodes and underlying aorta or IVC. Given burden of disease and my concern that resection was not feasible, I asked my senior partner (Dr. Eloy) to evaluate intra-op. She agreed that moving forward with attempt at debulking involved lymph nodes was likely not feasible and an attempt at this time would be unsafe.   There was no obvious peritoneal disease and no clear involvement of gyn organs. Despite prior biopsy, findings at surgery remain concerning for a non-gynecologic process. If this is confirmed to be low-grade serous carcinoma of gyn origin, this was an R2 resection.    12/15/2020 Initial Diagnosis   Extraovarian primary peritoneal carcinoma (HCC)   12/15/2020 Cancer Staging   Staging form: Ovary, AJCC 7th Edition - Pathologic stage from 12/15/2020: Stage IIIC (T3c,  N0, cM0) - Signed by Lonn Hicks, MD on 12/15/2020 Staged by: Managing physician Stage prefix: Initial diagnosis   01/04/2021 Procedure   Successful placement of a power injectable Port-A-Cath via the right internal jugular vein. The catheter is ready for immediate use.   01/08/2021 - 02/19/2021 Chemotherapy    Patient is on Treatment Plan: OVARIAN CARBOPLATIN  (AUC 6) / PACLITAXEL  (175) Q21D X 6 CYCLES       01/29/2021 Tumor Marker   Patient's tumor was tested for the following markers: CA-125. Results of the tumor marker test revealed 59.   02/05/2021 Imaging   Right:  No evidence of superficial vein thrombosis in the upper extremity.  Findings  consistent with acute deep vein thrombosis involving the right internal jugular vein.     Left:  No evidence of thrombosis in the subclavian.       03/10/2021 Tumor Marker   Patient's tumor was tested for the following markers: CA-125. Results of the tumor marker test revealed 81.8.   03/11/2021 Imaging   Stable bulky calcified retroperitoneal lymphadenopathy, consistent with metastatic disease.   Stable right hepatic lobe lesion, likely representing a benign hemangioma. This shoeds no FDG uptake on previous PET-CT scan.   No evidence of new or progressive disease within the abdomen or pelvis.   Aortic Atherosclerosis (ICD10-I70.0).   04/06/2021 Procedure   Successful removal of implanted Port-A-Cath.   04/29/2021 Tumor Marker   Patient's tumor was tested for the following markers: CA-125. Results of the tumor marker test revealed 29.4.   05/22/2021 Genetic Testing   Negative hereditary cancer genetic testing: no pathogenic variants detected in [facility] Multi-Cancer +RNA Panel.  The report date is 05/22/2021.   The Multi-Cancer + RNA Panel offered by [facility] includes sequencing and/or deletion/duplication analysis of the following 84 genes:  AIP*, ALK, APC*, ATM*, AXIN2*, BAP1*, BARD1*, BLM*, BMPR1A*, BRCA1*, BRCA2*, BRIP1*, CASR, CDC73*, CDH1*, CDK4, CDKN1B*,  CDKN1C*, CDKN2A, CEBPA, CHEK2*, CTNNA1*, DICER1*, DIS3L2*, EGFR, EPCAM, FH*, FLCN*, GATA2*, GPC3, GREM1, HOXB13, HRAS, KIT, MAX*, MEN1*, MET, MITF, MLH1*, MSH2*, MSH3*, MSH6*, MUTYH*, NBN*, NF1*, NF2*, NTHL1*, PALB2*, PDGFRA, PHOX2B, PMS2*, POLD1*, POLE*, POT1*, PRKAR1A*, PTCH1*, PTEN*, RAD50*, RAD51C*, RAD51D*, RB1*, RECQL4, RET, RUNX1*, SDHA*, SDHAF2*, SDHB*, SDHC*, SDHD*, SMAD4*, SMARCA4*, SMARCB1*, SMARCE1*, STK11*, SUFU*, TERC, TERT, TMEM127*, Tp53*, TSC1*, TSC2*, VHL*, WRN*, and WT1.  RNA analysis is performed for * genes.   07/08/2021 Tumor Marker   Patient's tumor was tested for the following markers: CA-125. Results of the tumor marker test revealed 16.   07/12/2021 Imaging   CT A/P 1. Mild decrease in size of isolated calcified abdominal retroperitoneal nodal metastasis. 2. No new or progressive disease. 3. Similar high right hepatic lobe lesion, favoring a hemangioma. 4.  Aortic Atherosclerosis (ICD10-I70.0).   09/13/2021 Tumor Marker   Patient's tumor was tested for the following markers: CA-125. Results of the tumor marker test revealed 56.1.   09/27/2021 Imaging   CT A/P 1. Slight decreased size calcified retroperitoneal lymph nodes. No new or progressive disease. 2. Stable lesion of the right hepatic lobe, likely a hemangioma. 3. Aortic Atherosclerosis (ICD10-I70.0).   12/28/2021 Imaging   CT A/P 1. Calcified abdominal adenopathy is minimally decreased in size since 09/28/2021, most consistent with response to therapy. 2. No new or progressive disease. 3. Similar borderline to mild common duct dilatation, without cause identified. This could be correlated with bilirubin level. 4.  Aortic Atherosclerosis (ICD10-I70.0).   12/31/2021 Tumor Marker   Patient's tumor was tested for the following markers: CA-125. Results  of the tumor marker test revealed 19.   04/01/2022 Tumor Marker   Patient's tumor was tested for the following markers: CA-125. Results of the tumor marker test  revealed 14.2.     Interval History: Doing well.  Some mild improvement in her bowel function.  Tolerating tamoxifen  well still.  Her mother continues to decline.  She serves as the primary caregiver.  Continues to have concerns about her own memory although had to cancel testing for dementia that had been scheduled.  Has noticed some improvement in her memory and overall how she is feeling with B12 supplementation.  Past Medical/Surgical History: Past Medical History:  Diagnosis Date   Arthritis    Chronic pain    Contact lens/glasses fitting    wears contacts or glasses   Depression    Family history of melanoma 04/29/2021   Fibromyalgia    Melanoma (HCC)    Peritoneal carcinoma (HCC)    PONV (postoperative nausea and vomiting)    prolonged sedation very hard to wake    Past Surgical History:  Procedure Laterality Date   BACK SURGERY     lumb lam   BREAST BIOPSY Right    2021   BREAST BIOPSY Left    BREAST ENHANCEMENT SURGERY  05/30/1980   implants    BREAST IMPLANT EXCHANGE  05/30/2009   CARPAL TUNNEL RELEASE  05/30/2001   rt and lt   CARPOMETACARPEL SUSPENSION PLASTY  06/22/2012   Procedure: CARPOMETACARPEL (CMC) SUSPENSION PLASTY;  Surgeon: Norleen LITTIE Gavel, MD;  Location: [facility];  Service: Orthopedics;  Laterality: Left;  burton's interposition arthroplasty left thumb    CATARACT EXTRACTION W/ INTRAOCULAR LENS  IMPLANT, BILATERAL     COLONOSCOPY     x2   DIAGNOSTIC LAPAROSCOPY  05/30/2000   expl lap   DILATION AND CURETTAGE OF UTERUS  05/30/2001   ablation   ELBOW SURGERY  05/31/1999   decompression rt   EPIDURAL BLOCK INJECTION     multiple   IR IMAGING GUIDED PORT INSERTION  01/04/2021   IR REMOVAL TUN ACCESS W/ PORT W/O FL MOD SED  04/06/2021   LYMPH NODE DISSECTION N/A 12/01/2020   Procedure: LYMPH NODE DISSECTION;  Surgeon: Viktoria Comer SAUNDERS, MD;  Location: [facility];  Service: Gynecology;  Laterality: N/A;   SKIN CANCER EXCISION      melanoma, from abdomen   TONSILLECTOMY      Family History  Problem Relation Age of Onset   Melanoma Mother        behind ear; on leg; dx after 50   Basal cell carcinoma Mother        face   Lung cancer Father 79       smoking hx   Leukemia Maternal Grandmother        dx after 90   Lung cancer Cousin        maternal female cousin; dx after 67; smoking hx   Cancer Cousin        paternal female cousin; unknown type; dx after 73   Colon cancer Neg Hx    Breast cancer Neg Hx    Ovarian cancer Neg Hx    Endometrial cancer Neg Hx    Pancreatic cancer Neg Hx    Prostate cancer Neg Hx     Social History   Socioeconomic History   Marital status: Married    Spouse name: Not on file   Number of children: Not on file   Years of education:  Not on file   Highest education level: Not on file  Occupational History   Not on file  Tobacco Use   Smoking status: Never   Smokeless tobacco: Never  Vaping Use   Vaping status: Never Used  Substance and Sexual Activity   Alcohol use: Yes    Comment: rare   Drug use: No   Sexual activity: Yes    Partners: Male    Birth control/protection: Post-menopausal  Other Topics Concern   Not on file  Social History Narrative   Not on file   Social Drivers of Health   Financial Resource Strain: Not on file  Food Insecurity: Low Risk  (10/10/2023)   Received from [facility]   Hunger Vital Sign    Within the past 12 months, you worried that your food would run out before you got money to buy more: Never true    Within the past 12 months, the food you bought just didn't last and you didn't have money to get more. : Never true  Transportation Needs: No Transportation Needs (10/10/2023)   Received from [facility]    In the past 12 months, has lack of reliable transportation kept you from medical appointments, meetings, work or from getting things needed for daily living? : No  Physical Activity: Not on file  Stress: Not on  file  Social Connections: Not on file    Current Medications:  Current Outpatient Medications:    cholecalciferol (VITAMIN D3) 25 MCG (1000 UNIT) tablet, Take 1,000 Units by mouth daily., Disp: , Rfl:    CREATINE PO, Take by mouth daily., Disp: , Rfl:    cyanocobalamin 2000 MCG tablet, Take 2,000 mcg by mouth., Disp: , Rfl:    estradiol  (ESTRACE  VAGINAL) 0.1 MG/GM vaginal cream, Place 1 Applicatorful vaginally 3 (three) times a week., Disp: 42.5 g, Rfl: 12   FLUoxetine  (PROZAC ) 20 MG capsule, Take 20 mg by mouth every morning., Disp: , Rfl:    tamoxifen  (NOLVADEX ) 20 MG tablet, TAKE 1 TABLET BY MOUTH 2 TIMES DAILY., Disp: 180 tablet, Rfl: 3   traZODone  (DESYREL ) 25 mg TABS tablet, Take 25 mg by mouth at bedtime., Disp: , Rfl:    Vibegron (GEMTESA) 75 MG TABS, Take 1 tablet by mouth daily., Disp: , Rfl:    azithromycin  (ZITHROMAX ) 250 MG tablet, Take 1 tablet (250 mg total) by mouth daily. Take first 2 tablets together, then 1 every day until finished. (Patient not taking: Reported on 01/31/2024), Disp: 6 tablet, Rfl: 0   Fluoxetine  HCl, PMDD, 20 MG TABS, Take by mouth., Disp: , Rfl:    Omega-3 Fatty Acids (FISH OIL BURP-LESS) 1000 MG CAPS, Take by mouth., Disp: , Rfl:   Review of Systems: + Fatigue, dyspareunia, joint pain, easy bruising/bleeding, anxiety, depression, decreased concentration. Denies appetite changes, fevers, chills, unexplained weight changes. Denies hearing loss, neck lumps or masses, mouth sores, ringing in ears or voice changes. Denies cough or wheezing.  Denies shortness of breath. Denies chest pain or palpitations. Denies leg swelling. Denies abdominal distention, pain, blood in stools, constipation, diarrhea, nausea, vomiting, or early satiety. Denies dysuria, frequency, hematuria or incontinence. Denies hot flashes, pelvic pain, vaginal bleeding or vaginal discharge.   Denies back pain or muscle pain/cramps. Denies itching, rash, or wounds. Denies dizziness,  headaches, numbness or seizures. Denies swollen lymph nodes or glands.  Physical Exam: BP 135/68 (BP Location: Left Arm, Patient Position: Sitting)   Pulse 63   Temp 98.8 F (37.1  C) (Oral)   Resp 19   Wt 148 lb 6.4 oz (67.3 kg)   SpO2 98%   BMI 27.35 kg/m  General: Alert, oriented, no acute distress. HEENT: Normocephalic, atraumatic, sclera anicteric. Chest: Clear to auscultation bilaterally.  No wheezes or rhonchi. Cardiovascular: Regular rate and rhythm, no murmurs. Abdomen: soft, nontender.  Normoactive bowel sounds.  No masses or hepatosplenomegaly appreciated.  Well-healed incisions. Extremities: Grossly normal range of motion.  Warm, well perfused.  No edema bilaterally. Skin: No rashes or lesions noted. Lymphatics: No cervical, supraclavicular, or inguinal adenopathy. GU: Normal appearing external genitalia without erythema, excoriation, or lesions.  Speculum exam reveals mildly atrophic vaginal mucosa, no lesions or masses noted.  Cuff intact.  Bimanual exam reveals no masses or nodularity.    Laboratory & Radiologic Studies: Component Ref Range & Units (hover) 4 mo ago (09/22/23) 8 mo ago (05/26/23) 11 mo ago (02/09/23) 1 yr ago (11/10/22) 1 yr ago (07/15/22) 1 yr ago (04/01/22) 2 yr ago (12/31/21)  Cancer Antigen (CA) 125 11.1 8.4 CM 7.9 CM 7.9 CM 11.7 CM 14.2 CM 19.0    CT A/P 09/2023: Stable calcified retroperitoneal adenopathy suggestive of treated metastatic disease with no convincing evidence for active malignancy within the abdomen or pelvis.  Assessment & Plan: Johna Kearl is a 68 y.o. woman with Stage IIIC presumed primary peritoneal carcinoma, low-grade. Completed adjuvant chemotherapy in 01/2021.  Currently on Tamoxifen . Last CT in 09/2023 showed stable calcified retroperitoneal adenopathy, no evidence of progression. Bone density 05/2023: osteopenia   Patient is doing well.  NED on exam today.  She is tolerating tamoxifen  very well.     CA125 was  drawn today.    Support offered for challenging home situation with social stressors including being the primary caretaker for her mother who has dementia.  Also discussed her own memory concerns and encouraged her to look at options for more help at home with her mother, which would allow her to reschedule memory testing that she had to cancel.   We will plan for surveillance visit in 4 months with blood work at that time.  Plan for next imaging in early 2026. We reviewed signs and symptoms that should prompt a phone call between visits were reviewed.  22 minutes of total time was spent for this patient encounter, including preparation, face-to-face counseling with the patient and coordination of care, and documentation of the encounter.  Comer Dollar, MD  Division of Gynecologic Oncology  Department of Obstetrics and Gynecology  [facility]

## 2024-02-02 NOTE — Patient Instructions (Signed)
 It was good to see you today.  I do not see or feel any evidence of cancer recurrence on your exam.  I will see you for follow-up in 4 months.  As always, if you develop any new and concerning symptoms before your next visit, please call to see me sooner.

## 2024-02-03 LAB — CA 125: Cancer Antigen (CA) 125: 9.3 U/mL (ref 0.0–38.1)

## 2024-02-05 ENCOUNTER — Ambulatory Visit: Payer: Self-pay | Admitting: Gynecologic Oncology

## 2024-02-05 ENCOUNTER — Telehealth: Payer: Self-pay

## 2024-02-05 NOTE — Telephone Encounter (Signed)
-----   Message from [name] [name] sent at 02/05/2024 10:23 AM EDT ----- Please let her know her CA 125 level is 9.3. Stable and remains in normal range.

## 2024-02-05 NOTE — Telephone Encounter (Signed)
 Ms.O'Donnell is aware of recent CA125 result as resulted by Eleanor Epps NP.

## 2024-02-19 ENCOUNTER — Telehealth: Payer: Self-pay

## 2024-02-19 NOTE — Telephone Encounter (Signed)
 Office received a Mychart notification that [name] [name] has not seen message regarding upcoming CT scan.   I reached out to her and she is aware of CT scan scheduled for 06/06/24 @ 2:00 [facility], with arrival time of 11:45 for contrast.  [name] [name] agrees to date/time

## 2024-02-20 NOTE — Telephone Encounter (Signed)
 Spoke with the patient and gave the appt dates/times and instructions for Jan. Patient aware and verbalized understanding

## 2024-06-06 ENCOUNTER — Inpatient Hospital Stay (HOSPITAL_BASED_OUTPATIENT_CLINIC_OR_DEPARTMENT_OTHER): Admission: RE | Admit: 2024-06-06 | Source: Ambulatory Visit | Admitting: Radiology

## 2024-06-07 ENCOUNTER — Other Ambulatory Visit (HOSPITAL_COMMUNITY)

## 2024-06-07 ENCOUNTER — Telehealth: Payer: Self-pay | Admitting: *Deleted

## 2024-06-07 NOTE — Telephone Encounter (Signed)
 Pt called to schedule her CT scan. She was notified that her CT was scheduled yesterday. Pt was upset that she missed her Ct appt. Pt given number to call to reschedule her Ct pior to her appt on 1/16 with Dr. Viktoria

## 2024-06-12 ENCOUNTER — Telehealth: Payer: Self-pay | Admitting: *Deleted

## 2024-06-12 NOTE — Telephone Encounter (Signed)
 Spoke with patient who states she needs to reschedule her Friday 1/16 appt. With Dr. Viktoria because her mother is on home hospice and she wants to be with her when she passes. Pt also states she needed to reschedule her CT scan to Feb 2 because she missed her appt.   Patient has been rescheduled for Friday, Feb 20 th at 2pm. Pt agreed to new date and time and thanked the office.

## 2024-06-14 ENCOUNTER — Inpatient Hospital Stay: Admitting: Gynecologic Oncology

## 2024-06-14 ENCOUNTER — Inpatient Hospital Stay: Attending: Gynecologic Oncology

## 2024-06-14 DIAGNOSIS — C481 Malignant neoplasm of specified parts of peritoneum: Secondary | ICD-10-CM

## 2024-06-24 ENCOUNTER — Other Ambulatory Visit: Payer: Self-pay | Admitting: Gynecologic Oncology

## 2024-06-24 DIAGNOSIS — C481 Malignant neoplasm of specified parts of peritoneum: Secondary | ICD-10-CM

## 2024-07-04 ENCOUNTER — Inpatient Hospital Stay (HOSPITAL_BASED_OUTPATIENT_CLINIC_OR_DEPARTMENT_OTHER)
Admission: RE | Admit: 2024-07-04 | Discharge: 2024-07-04 | Disposition: A | Source: Ambulatory Visit | Attending: Gynecologic Oncology | Admitting: Radiology

## 2024-07-04 DIAGNOSIS — C481 Malignant neoplasm of specified parts of peritoneum: Secondary | ICD-10-CM

## 2024-07-04 MED ORDER — IOHEXOL 300 MG/ML  SOLN
100.0000 mL | Freq: Once | INTRAMUSCULAR | Status: AC | PRN
  Administered 2024-07-04: 100 mL via INTRAVENOUS

## 2024-07-19 ENCOUNTER — Inpatient Hospital Stay: Attending: Gynecologic Oncology | Admitting: Gynecologic Oncology
